# Patient Record
Sex: Female | Born: 1950
Health system: Southern US, Community
[De-identification: ages and names within clinical notes are randomized; demographics above are authoritative.]

## PROBLEM LIST (undated history)

## (undated) DIAGNOSIS — F329 Major depressive disorder, single episode, unspecified: Secondary | ICD-10-CM

## (undated) DIAGNOSIS — I10 Essential (primary) hypertension: Secondary | ICD-10-CM

## (undated) DIAGNOSIS — I471 Supraventricular tachycardia, unspecified: Secondary | ICD-10-CM

## (undated) DIAGNOSIS — F32A Depression, unspecified: Secondary | ICD-10-CM

## (undated) DIAGNOSIS — D649 Anemia, unspecified: Secondary | ICD-10-CM

## (undated) DIAGNOSIS — K633 Ulcer of intestine: Secondary | ICD-10-CM

## (undated) DIAGNOSIS — G4733 Obstructive sleep apnea (adult) (pediatric): Secondary | ICD-10-CM

## (undated) DIAGNOSIS — I4891 Unspecified atrial fibrillation: Secondary | ICD-10-CM

## (undated) HISTORY — DX: Anemia, unspecified: D64.9

## (undated) HISTORY — DX: Essential (primary) hypertension: I10

## (undated) HISTORY — DX: Obstructive sleep apnea (adult) (pediatric): G47.33

## (undated) HISTORY — PX: TOENAIL EXCISION: SUR558

## (undated) HISTORY — DX: Supraventricular tachycardia: I47.1

## (undated) HISTORY — DX: Ulcer of intestine: K63.3

## (undated) HISTORY — DX: Depression, unspecified: F32.A

## (undated) HISTORY — DX: Supraventricular tachycardia, unspecified: I47.10

## (undated) HISTORY — PX: WISDOM TOOTH EXTRACTION: SHX21

## (undated) HISTORY — DX: Major depressive disorder, single episode, unspecified: F32.9

---

## 2001-04-03 ENCOUNTER — Observation Stay (HOSPITAL_COMMUNITY): Admission: EM | Admit: 2001-04-03 | Discharge: 2001-04-05 | Payer: Self-pay | Admitting: Emergency Medicine

## 2001-04-03 ENCOUNTER — Encounter: Payer: Self-pay | Admitting: Emergency Medicine

## 2002-02-11 ENCOUNTER — Ambulatory Visit (HOSPITAL_COMMUNITY): Admission: RE | Admit: 2002-02-11 | Discharge: 2002-02-11 | Payer: Self-pay | Admitting: Internal Medicine

## 2003-11-18 ENCOUNTER — Emergency Department (HOSPITAL_COMMUNITY): Admission: EM | Admit: 2003-11-18 | Discharge: 2003-11-19 | Payer: Self-pay | Admitting: Emergency Medicine

## 2004-09-19 ENCOUNTER — Emergency Department (HOSPITAL_COMMUNITY): Admission: EM | Admit: 2004-09-19 | Discharge: 2004-09-19 | Payer: Self-pay | Admitting: *Deleted

## 2004-10-22 ENCOUNTER — Ambulatory Visit: Payer: Self-pay | Admitting: Psychiatry

## 2005-01-16 ENCOUNTER — Ambulatory Visit: Payer: Self-pay | Admitting: Psychiatry

## 2005-02-20 ENCOUNTER — Ambulatory Visit: Payer: Self-pay | Admitting: Psychiatry

## 2005-03-27 ENCOUNTER — Ambulatory Visit: Payer: Self-pay | Admitting: Psychiatry

## 2005-08-26 ENCOUNTER — Ambulatory Visit (HOSPITAL_COMMUNITY): Payer: Self-pay | Admitting: Psychiatry

## 2005-09-09 ENCOUNTER — Ambulatory Visit (HOSPITAL_COMMUNITY): Payer: Self-pay | Admitting: Psychiatry

## 2005-09-19 ENCOUNTER — Ambulatory Visit (HOSPITAL_COMMUNITY): Payer: Self-pay | Admitting: Psychiatry

## 2005-10-27 ENCOUNTER — Ambulatory Visit (HOSPITAL_COMMUNITY): Payer: Self-pay | Admitting: Psychiatry

## 2005-11-17 ENCOUNTER — Ambulatory Visit (HOSPITAL_COMMUNITY): Payer: Self-pay | Admitting: Psychiatry

## 2005-12-01 ENCOUNTER — Ambulatory Visit (HOSPITAL_COMMUNITY): Payer: Self-pay | Admitting: Psychiatry

## 2006-01-01 ENCOUNTER — Ambulatory Visit (HOSPITAL_COMMUNITY): Payer: Self-pay | Admitting: Psychiatry

## 2006-05-04 ENCOUNTER — Emergency Department (HOSPITAL_COMMUNITY): Admission: EM | Admit: 2006-05-04 | Discharge: 2006-05-04 | Payer: Self-pay | Admitting: Emergency Medicine

## 2007-05-20 HISTORY — PX: COLONOSCOPY: SHX174

## 2007-10-30 ENCOUNTER — Ambulatory Visit (HOSPITAL_COMMUNITY): Admission: RE | Admit: 2007-10-30 | Discharge: 2007-10-30 | Payer: Self-pay | Admitting: Gastroenterology

## 2007-11-11 ENCOUNTER — Ambulatory Visit: Payer: Self-pay | Admitting: Internal Medicine

## 2008-01-12 ENCOUNTER — Encounter: Admission: RE | Admit: 2008-01-12 | Discharge: 2008-01-12 | Payer: Self-pay | Admitting: Internal Medicine

## 2008-02-18 ENCOUNTER — Encounter: Payer: Self-pay | Admitting: Internal Medicine

## 2008-02-18 ENCOUNTER — Ambulatory Visit (HOSPITAL_COMMUNITY): Admission: RE | Admit: 2008-02-18 | Discharge: 2008-02-18 | Payer: Self-pay | Admitting: Internal Medicine

## 2008-02-18 ENCOUNTER — Ambulatory Visit: Payer: Self-pay | Admitting: Internal Medicine

## 2008-05-02 ENCOUNTER — Encounter: Payer: Self-pay | Admitting: Internal Medicine

## 2008-05-02 LAB — CONVERTED CEMR LAB: Creatinine, Ser: 1.01 mg/dL (ref 0.40–1.20)

## 2008-05-05 ENCOUNTER — Ambulatory Visit (HOSPITAL_COMMUNITY): Admission: RE | Admit: 2008-05-05 | Discharge: 2008-05-05 | Payer: Self-pay | Admitting: Internal Medicine

## 2008-07-18 ENCOUNTER — Ambulatory Visit: Payer: Self-pay | Admitting: Internal Medicine

## 2008-07-19 ENCOUNTER — Telehealth (INDEPENDENT_AMBULATORY_CARE_PROVIDER_SITE_OTHER): Payer: Self-pay | Admitting: *Deleted

## 2009-03-09 ENCOUNTER — Encounter: Payer: Self-pay | Admitting: Internal Medicine

## 2009-03-09 ENCOUNTER — Encounter (INDEPENDENT_AMBULATORY_CARE_PROVIDER_SITE_OTHER): Payer: Self-pay | Admitting: *Deleted

## 2009-05-21 ENCOUNTER — Encounter: Payer: Self-pay | Admitting: Internal Medicine

## 2009-05-21 DIAGNOSIS — K559 Vascular disorder of intestine, unspecified: Secondary | ICD-10-CM | POA: Insufficient documentation

## 2009-07-08 ENCOUNTER — Emergency Department (HOSPITAL_COMMUNITY): Admission: EM | Admit: 2009-07-08 | Discharge: 2009-07-09 | Payer: Self-pay | Admitting: Emergency Medicine

## 2009-08-30 ENCOUNTER — Encounter: Payer: Self-pay | Admitting: Internal Medicine

## 2010-06-18 NOTE — Miscellaneous (Signed)
Summary: Orders Update  Clinical Lists Changes  Problems: Added new problem of ISCHEMIC COLITIS (ICD-557.9) Orders: Added new Test order of T-Vitamin B12 [id number]) - Signed  Appended Document: Orders Update Lab order mailed to pt.

## 2010-06-18 NOTE — Letter (Signed)
Summary: [facility] NOTE  [facility] NOTE   Imported By: Sofie Rower 08/30/2009 12:08:03  _____________________________________________________________________  External Attachment:    Type:   Image     Comment:   External Document

## 2010-08-07 LAB — DIFFERENTIAL
Basophils Relative: 0 % (ref 0–1)
Lymphocytes Relative: 22 % (ref 12–46)
Lymphs Abs: 2.5 10*3/uL (ref 0.7–4.0)
Monocytes Absolute: 0.8 10*3/uL (ref 0.1–1.0)
Monocytes Relative: 7 % (ref 3–12)
Neutro Abs: 7.9 10*3/uL — ABNORMAL HIGH (ref 1.7–7.7)
Neutrophils Relative %: 69 % (ref 43–77)

## 2010-08-07 LAB — BASIC METABOLIC PANEL
CO2: 29 mEq/L (ref 19–32)
Calcium: 9.4 mg/dL (ref 8.4–10.5)
Creatinine, Ser: 0.93 mg/dL (ref 0.4–1.2)
GFR calc Af Amer: >60 mL/min (ref >60)
GFR calc non Af Amer: >60 mL/min (ref >60)
Sodium: 133 mEq/L — ABNORMAL LOW (ref 135–145)

## 2010-08-07 LAB — POCT CARDIAC MARKERS
CKMB, poc: <1 ng/mL — ABNORMAL LOW (ref 1.0–8.0)
Myoglobin, poc: 64.7 ng/mL (ref 12–200)
Troponin i, poc: <0.05 ng/mL (ref 0.00–0.09)

## 2010-08-07 LAB — CBC
Hemoglobin: 13.4 g/dL (ref 12.0–15.0)
RBC: 4.62 MIL/uL (ref 3.87–5.11)
WBC: 11.4 10*3/uL — ABNORMAL HIGH (ref 4.0–10.5)

## 2010-08-22 ENCOUNTER — Ambulatory Visit (INDEPENDENT_AMBULATORY_CARE_PROVIDER_SITE_OTHER): Payer: Managed Care, Other (non HMO) | Admitting: Otolaryngology

## 2010-08-22 DIAGNOSIS — H6990 Unspecified Eustachian tube disorder, unspecified ear: Secondary | ICD-10-CM

## 2010-08-22 DIAGNOSIS — J310 Chronic rhinitis: Secondary | ICD-10-CM

## 2010-08-22 DIAGNOSIS — H6980 Other specified disorders of Eustachian tube, unspecified ear: Secondary | ICD-10-CM

## 2010-10-01 NOTE — Op Note (Signed)
NAMELeena, Julia Martinez                ACCOUNT NO.:  [id number]   MEDICAL RECORD NO.:  [id number]          PATIENT TYPE:  AMB   LOCATION:  DAY                           FACILITY:  [facility]   PHYSICIAN:  R. Garfield Cornea, M.D. DATE OF BIRTH:  08/23/1950   DATE OF PROCEDURE:  02/18/2008  DATE OF DISCHARGE:                               OPERATIVE REPORT   INDICATIONS FOR PROCEDURE:  A 60 year old lady with a recent episode of  self-limiting abdominal cramps and diarrhea followed by blood per  rectum.  CT suggested ischemic or segmental colitis.  Symptoms pretty  well came and went within 48 hours.  She is currently devoid of any  lower GI tract symptoms.  In fact, she denies any abdominal pain or any  alteration of bowel habits.  Denies melena or hematochezia.  She does  not smoke.  No odynophagia, dysphagia, or early satiety, reflux  symptoms, nausea, vomiting.  Ileal colonoscopy now being done.  Her last  colonoscopy was in September 2003 by Dr. Collene Mares.  She was found have 2  diverticula on her left colon, normal ileocecal valve, and terminal  ileum.  Risks, benefits, alternatives, and limitations of this approach  have been reviewed and questions answered.   PROCEDURE NOTE:  O2 saturation, blood pressure, and pulse of the patient  monitored throughout the entire procedure. Conscious sedation, Versed 3  mg IV and Demerol 75 mg IV in divided doses.   INSTRUMENT:  Pentax video chip system.   FINDINGS:  Digital rectal exam reveals single external hemorrhoidal tag,  otherwise unremarkable.   Endoscopic findings: The prep was good.   Colon:  Colonic mucosa was surveyed from the rectosigmoid junction  through the left, transverse, right colon to the appendiceal orifice,  ileocecal valve, and cecum.  These structures were well seen and  photographed for the record. The terminal ileum was intubated 30 cm.  From this level, the scope was slowly withdrawn.  Previously mentioned  mucosal surfaces  were again seen.  The following abnormalities were  noted.  The ileocecal valve was markedly abnormal with a gaping fish mouth  appearance.  There appeared to be some retraction in the mucosa around  the valve.  The terminal ileum was inflamed.  There were multiple  diverticula and pseudodiverticula present.  The distal 10 cm of the  terminal ileum appeared somewhat cavernous.  There was a 1-cm ulcer with  surrounding erosions, very distal terminal ileum at the junction of the  ileum and colon.  These inflammatory changes extended up to 30 cm.  These appeared to be benign inflammatory changes.  Multiple biopsies  taken.   The patient also had numerous left-sided diverticula; however, there is  no evidence of neoplasm, colitis, or colon cancer.  The scope was pulled  down to the rectum with the examination of the rectal mucosa including  retroflexed view of the anal verge demonstrated no abnormalities.  The  patient tolerated the procedure well and reacted in endoscopy.   IMPRESSION:  1. Single external hemorrhoid tag, otherwise normal rectum.  2. Numerous left-sided diverticula, abnormal ileocecal  valve, and      terminal ileum highly suggestive of Crohn disease status post      biopsy.  Remainder of colonic mucosa appeared unremarkable.   The patient's recent self-limiting illness characterized by abdominal  cramps and bloody diarrhea consistent with CT findings most consistent  with ischemic or segmental colitis, which is almost always a self-  limiting non-recurrent disorder.   She has marked inflamed chronic-appearing inflammatory changes of her  ileocecal valve and terminal ileum, not seen on a iliac colonoscopy 6  years ago by Dr. Collene Mares.  She is not taking any nonsteroidal agents.   These findings are most likely consistent with a Crohn ileitis.   She is essentially devoid of any GI symptoms at this time.   RECOMMENDATIONS:  1. Diverticulosis literature provided to Ms.  Sloma.  2. Follow up on path.  3. Further recommendations to follow.      Bridgette Habermann, M.D.  Electronically Signed     RMR/MEDQ  D:  02/18/2008  T:  02/18/2008  Job:  [id number]   cc:   Meda Klinefelter  Fax: [phone]

## 2010-10-01 NOTE — Assessment & Plan Note (Signed)
NAMECAYDANCE, Julia Martinez                 CHART#:  [id number]   DATE:  07/18/2008                       DOB:  [birth year]   A 60 year old lady who I saw last year for a self-limiting illness,  abdominal cramps and bloody diarrhea.  Abdominal CT suggested segmental  or ischemic colitis.  On February 18, 2008 she underwent a colonoscopy by  me sometime after her acute symptoms resolved.  She was found to have an  external hemorrhoidal tag, numerous left-sided diverticula and a grossly  abnormal ileocecal valve with a gaping fish-mouth appearance opening.  There were multiple diverticula and pseudodiverticula present in the  distal terminal ileum.  There were erosions and ileal ulcerations.  Multiple biopsies were taken.  Biopsies revealed nonspecific  ulcerations, there were no granulomas.  She subsequently underwent a CT  enterography which revealed chronic inflammatory change of the distal 18  cm or so of terminal ileum, almost certainly indicative of small bowel  Crohn disease.   I looked back at her last colonoscopy photos in the old olypmus system  that Dr. Laural Martinez performed back in 2003.  Her Ileocecal valve appeared  entirely normal based on the photographs, terminal ileum was not  intubated at that time.  Ms. Julia Martinez is back in the office today for  followup.  She has absolutely no GI symptoms.  No abdominal pain  whatsoever.  She has 1-2 formed bowel movements daily, has not seen any  melena or hematochezia.  She has actually been gaining weight and is  trying to exercise and obtain a more advantageousl BMI.  She has not had  any early satiety, nausea or vomiting, has not had any fever.  No eye,  joint or skin problems.  I did briefly speak to Dr. Koleen Martinez about Ms.  Julia Martinez on May 15, 2008, and he was not really sure what he would  do in this setting as far as committing Ms. Julia Martinez to long-term  chronic therapy for Crohn disease, given that as best I can tell she is  totally  asymptomatic.  It is also worth noting she is not taking any  nonsteroidal agents whatsoever.   CURRENT MEDICATIONS:  Cardizem 240 mg daily, metoprolol daily.   ALLERGIES:  PENICILLIN.   EXAMINATION TODAY:  A pleasant 59-year lady resting comfortably, weight  168.5, height 5 feet 4, temp 98.5, BP 168/84, pulse 88.  SKIN:  Warm and dry.  There is no jaundice or cutaneous stigmata of  chronic liver disease.  CHEST:  Lungs are clear to auscultation.  CARDIAC EXAM:  Regular rate and rhythm without murmur, gallop or rub.  ABDOMEN:  Nondistended, positive bowel sounds, entirely soft and  nontender without appreciable mass or organomegaly.   ASSESSMENT:  Markedly abnormal ileocecal valve and terminal ileum,  endoscopically, histologically and radiographically all suggestive of  small bowel Crohn disease in a very nice lady who has no symptoms  consistent with Crohn disease or any other gastrointestinal disorder for  that matter at this time.   As a totally separate issue, she did have a bout of left-sided ischemic  or segmental colitis last year which was a self-limiting process.   RECOMMENDATIONS:  I told Ms. Julia Martinez I really had no prognostic  information as to whether or not this process will progress and  increasing risk of  complications including a fistula, strictures, need  for operations, etc. or not.  We talked about committing to long-term  immunosuppressive therapy.  That is a difficult sell for me, given the  paucity of any symptoms.  However, I would certainly like to minize any  future complications.  I told Ms. Julia Martinez that we were in no rush to do  anything at this point in time, although I felt at this time the most  practical approach would be for her next encounter with a  gastroenterologist to be with Dr. Rickard Martinez over at [facility], perhaps a fall consultation to get his  opinion as to which way to go in terms of future management.  I  suppose  we could do some imaging of her small bowel, i.e. CT at yearly intervals  to assess stability/progression of this process.  Ms. Julia Martinez is  agreeable with this approach.  I will take the liberty of setting her up  this coming fall to see Dr. Koleen Martinez for a non-urgent consultation.  At some point later in the year, she ought to have a vitamin B12 level  drawn and get a current bone density study as a baseline if this has not  yet been done.   I will discuss further with Dr. Cleta Martinez in the near future.       Bridgette Julia Martinez, M.D.  Electronically Signed     RMR/MEDQ  D:  07/18/2008  T:  07/18/2008  Job:  [id number]   cc:   Julia Flesher. Halm, DO, FAAP

## 2010-10-01 NOTE — H&P (Signed)
NAMEDawnyel, Leven Julia Martinez                ACCOUNT NO.:  [id number]   MEDICAL RECORD NO.:  [id number]         PATIENT TYPE:  PAMB   LOCATION:  DAY                           FACILITY:  [facility]   PHYSICIAN:  R. Garfield Cornea, M.D. DATE OF BIRTH:  [birth year]   DATE OF ADMISSION:  DATE OF DISCHARGE:  LH                              HISTORY & PHYSICAL   REFERRING PHYSICIAN:  Micah Flesher. Halm, DO, FAAP   REASON FOR CONSULTATION:  Worse hematochezia, abnormal CT (see below).   HISTORY OF PRESENT ILLNESS:  Ms. Julia Martinez is a 60 year old lady,  mother of one of our endoscopy nurse at [facility], who  suffered an acute illness back early on October 30, 2007, where she  developed acute-onset abdominal cramps and diarrhea followed by several  episodes of gross blood per rectum.  The constellation of symptoms  pretty much came and went within 12 hours.  Her daughter, Julia Arnett, RN, brought this to the attention of my partner, Dr. Caro Hight in Endo.  One morning, he ordered a CT scan and some blood work.  CT scan (which was personally reviewed by Dr. Gilman Schmidt) revealed two liver  lesions, one in the right lobe consistent with hemangioma and a  nonspecific lesion in the caudate lobe of the liver and also had  inflammatory changes of the circle segmental sigmoid colon consistent  with diverticulitis or ischemic colitis.  It is notable in 2003, she  only had two checks as seen by Dr. Laural Golden in colonoscopy.  Please note,  she has not had any GI symptoms prior to this episode or since this  episode, which lasted less than 24 hours.   LABORATORY DATA:  Done on November 13 revealed a white count of 10.8,  H&H 14 and 40.5.  Chem-20 looked good except for slightly elevated  glucose of 116.  Her LFTs were normal.   Dr. Cleta Alberts recently saw this nice lady and did another hemoglobin on her  and it was found to be still normal at 12.8.  Ms. Julia Martinez does not  smoke.  She has no history of peripheral  vascular disease.  She does  have a history of hypertension and tachycardia.   Of note, she was given two prescriptions by Dr. Stann Mainland for Cipro and  Flagyl.  She took them for 3 days, became nauseated, and did not finish  the prescription.   PAST MEDICAL HISTORY:  Significant for hypertension and tachycardia.   PAST SURGICAL HISTORY:  C-section and LASIK eye surgery.   CURRENT MEDICATIONS:  1. Cardizem 240 mg daily.  2. Metoprolol 1 tablet daily.   ALLERGIES:  No known drug allergies.   FAMILY HISTORY:  Father died with congestive heart failure.  Mother died  with aneurysm.  No history of chronic GI or liver illness.   SOCIAL HISTORY:  The patient married for 34 years, has 3 children, 2  daughters and 1 son with Prader-Willi syndrome.  Employed part time at  the [employer] office.  No tobacco, no alcohol, no illicit  drugs.   REVIEW OF SYSTEMS:  Some chest pain.  No dyspnea on exertion.  No fever  or chills.  No change in weight.  No odynophagia, dysphagia, orthostatic  reflux symptoms, nausea, or vomiting.   PHYSICAL EXAMINATION:  GENERAL:  She is accompanied by her youngest  daughter.  VITAL SIGNS:  Weight 140 pounds, height 5 feet 14 inches, temp 97, BP  170/100, and pulse 92.  SKIN:  Warm and dry.  HEENT:  No scleral icterus.  Conjunctivae are pink.  CHEST:  Lungs clear to auscultation.  CARDIO:  Regular rate and rhythm without murmur, gallop, or rub.  ABDOMEN:  Nondistended.  Positive bowel sounds.  Entirely soft and  nontender without appreciable mass or organomegaly.  EXTREMITIES:  No edema.  RECTAL:  Deferred to time of colonoscopy.   IMPRESSION:  Ms. Julia Martinez is a very pleasant 60 year old lady who  experienced a self-limiting illness characterized by abdominal cramps  and diarrhea followed by gross blood per rectum.  These symptoms are  essentially textbook for ischemic or segmental colon, which is almost  always a self-limiting nonrecurrent disease  process.   She does not smoke and there is no history of peripheral vascular  disease.   Her symptoms came and left much too quick to be diverticulitis or a food  borne illness.  CAT scan findings are also entirely consistent with  ischemic or segmental colitis.   She did have a hemangioma in the right lobe of her liver and a  nonspecific lesion in the caudate lobe.  We agree with Dr. Gilman Schmidt this  needs to be followed upon, although hopefully it is likely going to turn  out to be nothing significant.   RECOMMENDATIONS:  Ms. Julia Martinez opted to go ahead and have a colonoscopy  to examine her entire colon with particular tension to the abnormal  segment seen on CT, although likely any signs of inflammation will be  not present by the time she comes for colonoscopy.  Discussed risks,  benefits, and alternatives with this nice lady.  Questions answered.  She is agreeable.  She wants to have this procedure done in August and  that is certainly fine with me.  Just before colonoscopy, I have  recommended she go and have an MRI for liver to further evaluate the  lesion seen  on her recent CT.  __________also discussed with Ms. Julia Martinez.  We will  make further recommendations in the very near future.   I would like to thank Dr. Loralyn Freshwater for allowing me to see this nice  lady today.      Julia Martinez, M.D.  Electronically Signed     Julia Martinez/MEDQ  D:  11/11/2007  T:  11/12/2007  Job:  [id number]   cc:   Micah Flesher. Cleta Alberts DO, [place]  Fax: [phone]

## 2010-10-04 NOTE — Procedures (Signed)
[facility]  Patient:    Julia Martinez, Julia Martinez Visit Number: [id number] MRN: [id number]          Service Type: OBV Location: 2A A220 01 Attending Physician:  Shaune Pollack Dictated by:   Sinda Du, M.D. Proc. Date: 04/03/01 Admit Date:  04/03/2001                            EKG Interpretations  TIME:  1423  INTERPRETATION:  The rhythm is an atrial fibrillation with a rapid ventricular response of about 150.  Q-T interval is prolonged, which may be due to an electrolyte imbalance, drug effect or primary myocardial disease.  There are diffuse ST-T wave abnormalities which are consistent with ischemia and clinical correlation is suggested.  IMPRESSION:  Abnormal electrocardiogram. Dictated by:   Sinda Du, M.D. Attending Physician:  Shaune Pollack DD:  04/04/01 TD:  04/05/01 Job: [id number]

## 2010-10-04 NOTE — H&P (Signed)
[facility]  Patient:    ALEXEI, EY Visit Number: [id number] MRN: [id number]          Service Type: OBV Location: 2A A220 01 Attending Physician:  Shaune Pollack Dictated by:   Loralyn Freshwater, D.O. Admit Date:  04/03/2001                           History and Physical  CHIEF COMPLAINT:  Palpitations and dizziness.  BRIEF HISTORY:  Patient is a 60 year old female known to my practice who presents to the emergency room with an acute onset of palpitations, generalized weakness and dizziness.  She was apparently in her car when she had this paroxysm of dizziness.  She took her pulse in her neck and noted that it was quite irregular.  She contacted her daughter, who assisted her to the emergency room for further evaluation.  Ms. Coonrod, of note, has had episodes of paroxysmal atrial tachycardia in the past and I believe she was hospitalized approximately seven or eight years ago for a couple of days due to this.  She has had no chest pain, no dyspnea and no swelling.  PAST MEDICAL HISTORY:  PAT with a relatively good detailed cardiac workup. She has a history of panic attacks and depression and has received disability for this in the past.  PAST SURGICAL HISTORY:  Cesarean section.  MEDICATIONS: 1. Lanoxin 0.125 mg daily. 2. Luvox 50 mg p.o. q.h.s.  ALLERGIES:  PENICILLIN.  FAMILY HISTORY:  Negative for premature coronary disease.  The patients husband interestingly has atrial fibrillation which is treated with Coumadin. The patients son has an unusual genetic condition called Prader-Willi syndrome.  REVIEW OF SYSTEMS:  Patient admits to dizziness but no loss of consciousness. She has not been taking excessive amounts of caffeinated products and admits to only two cokes on a daily basis.  She denies any significant headache.  She has had some dizziness, as noted.  She has had no visual complaints. Palpitations are infrequent with her and in  the past have responded to products such as Lanoxin and verapamil.  She has had no skin rash, no fevers, no cough, no URI symptoms, no abdominal pains, vomiting or diarrhea.  She has overall been stable in regards to her depression but she states that she has had increased generalized stress.  Of interest is that her husband leaves town this time of the year each year for a hunting trip in [place] and it was approximately this time a year ago that she had some palpitations which were managed at home.  PHYSICAL EXAMINATION:  VITAL SIGNS:  Initial heart rate was 140; it is currently between 86 and 130; her temperature is 97 degrees; respirations 20; blood pressure 148/98 initially and down to 82/50 after Lopressor.   Her O2 saturation is 99%.  GENERAL:  The patient is anxious but comfortable.  She has a mild resting tremor.  She is alert and oriented.  She has no JVD.  She has good carotid upstrokes on both sides with no bruits.  She has no thyromegaly.  HEART:  Tachycardia and irregular.  Her PMI is normal.  LUNGS:  Clear in both fields.  ABDOMEN:  Soft and nontender.  EXTREMITIES:  Good pulses.  They are both warm with no edema.  Deep tendon reflexes are unremarkable.  NEUROLOGIC:  Nonfocal.  GYN:  Deferred at this time.  BREASTS:  Deferred at this time.  LABORATORY STUDIES:  Potassium is 3.5, sodium 140, creatinine 0.9, BUN of 9. Calcium is normal at 9.2.  Liver function tests are normal.  PT is 12.8, PTT is 23.  CK total is 71 with a 1.2 MB fraction.  Troponin is 0.02.  Her Lanoxin level is subtherapeutic at 0.3.  EKG:  She had numerous EKGs in the emergency room and they both are consistent with a supraventricular tachycardia, which it does appear to be an atrial fibrillation.  Her QRS axes are normal.  She has some ST depression, probably due to her rapid rate, which is as high as 188.  I believe this is atrial fibrillation, given the irregularity of the rhythm overall.   Abnormal ECG.  IMPRESSION AND PLAN:  Supraventricular tachycardia/atrial fibrillation.  We will place the patient on higher dosing of Lanoxin and give her an extra 0.25 mg intravenously in the emergency room.  She has also currently received some Lopressor 5 mg as well as Cardizem intravenously.  I will plan to continue her Lopressor at 50 mg p.o. b.i.d.  We will place her on an aspirin a day and hold off on anticoagulation at this time because her symptoms have just started and she is acutely in atrial fibrillation and has no risk of forming clots and having cerebrovascular accident sequelae associated with chronic recurrent atrial fibrillation.  She also has a history of significant anxiety for which we will continue her on Luvox and Ativan, which she takes on occasion.  We will also obtain an echocardiogram while she is in the hospital to assess her atrial size to ensure that she will chemically convert.  We will also check a T4 and thyroid-stimulating hormone while she is here in the hospital. We will put her on a caffeine-free diet and follow her symptomatology.  The overall care plan has been reviewed with Ms. Schneck and she is in agreement.Dictated by:   Loralyn Freshwater, D.O. Attending Physician:  Shaune Pollack DD:  04/03/01 TD:  04/04/01 Job: [id number]

## 2010-10-04 NOTE — Procedures (Signed)
NAMEFELICITE, ZEIMET                ACCOUNT NO.:  [id number]   MEDICAL RECORD NO.:  [id number]          PATIENT TYPE:  EMS   LOCATION:  ED                            FACILITY:  [facility]   PHYSICIAN:  Edward L. Luan Pulling, M.D.DATE OF BIRTH:  09-May-1951   DATE OF PROCEDURE:  05/04/2006  DATE OF DISCHARGE:  05/04/2006                              EKG INTERPRETATION   EKG:  1846   The rhythm is sinus tachycardia with a rate of about 110-112.  The axis  is rightward.  There appears to be an incomplete right bundle branch  block.  ST-T wave abnormality is seen which could indicate ischemia and  clinical correlation is suggested.  Abnormal electrocardiogram.      Jasper Loser. Luan Pulling, M.D.  Electronically Signed     ELH/MEDQ  D:  05/05/2006  T:  05/06/2006  Job:  [id number]

## 2010-10-04 NOTE — Procedures (Signed)
[facility]  Patient:    Julia Martinez, Julia Martinez Visit Number: [id number] MRN: [id number]          Service Type: OBV Location: 2A A220 01 Attending Physician:  Janit Bern Dictated by:   Loralyn Freshwater, D.O. Proc. Date: 04/04/01 Admit Date:  04/03/2001 Discharge Date: 04/05/2001                            EKG Interpretations  ECG shows normal sinus rhythm.  The patient does have inverted T-waves in V1-V4 as well as in lead 3.  Review of her previous EKG shows that this is her normal pattern.  There is no evidence of dysrhythmia.  IMPRESSION:  Abnormal ECG, but unchanged to prior normal sinus rhythm reading. Dictated by:   Loralyn Freshwater, D.O. Attending Physician:  Janit Bern DD:  05/28/01 TD:  05/29/01 Job: [id number]
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
## 2011-02-13 LAB — CBC
Hemoglobin: 14
MCHC: 34.7
RBC: 4.68
RDW: 13.9

## 2011-02-13 LAB — COMPREHENSIVE METABOLIC PANEL
ALT: 23
AST: 20
Alkaline Phosphatase: 90
CO2: 26
Calcium: 9.6
GFR calc Af Amer: >60
Glucose, Bld: 116 — ABNORMAL HIGH
Potassium: 4.1
Sodium: 139
Total Protein: 7.3

## 2013-10-06 ENCOUNTER — Ambulatory Visit (INDEPENDENT_AMBULATORY_CARE_PROVIDER_SITE_OTHER): Payer: BC Managed Care – PPO

## 2013-10-06 ENCOUNTER — Ambulatory Visit (INDEPENDENT_AMBULATORY_CARE_PROVIDER_SITE_OTHER): Payer: BC Managed Care – PPO | Admitting: Orthopedic Surgery

## 2013-10-06 VITALS — BP 186/93

## 2013-10-06 DIAGNOSIS — M79642 Pain in left hand: Secondary | ICD-10-CM

## 2013-10-06 DIAGNOSIS — M79609 Pain in unspecified limb: Secondary | ICD-10-CM
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
## 2013-10-06 NOTE — Patient Instructions (Signed)
Call to arrange therapy

## 2013-10-24 ENCOUNTER — Ambulatory Visit (HOSPITAL_COMMUNITY)
Admission: RE | Admit: 2013-10-24 | Discharge: 2013-10-24 | Disposition: A | Discharge status: Home / Self Care | Payer: BC Managed Care – PPO | Source: Ambulatory Visit | Attending: Orthopedic Surgery | Admitting: Orthopedic Surgery

## 2013-10-24 DIAGNOSIS — M25649 Stiffness of unspecified hand, not elsewhere classified: Secondary | ICD-10-CM | POA: Insufficient documentation

## 2013-10-24 DIAGNOSIS — R279 Unspecified lack of coordination: Secondary | ICD-10-CM | POA: Insufficient documentation

## 2013-10-24 DIAGNOSIS — IMO0001 Reserved for inherently not codable concepts without codable children: Secondary | ICD-10-CM | POA: Insufficient documentation

## 2013-10-24 DIAGNOSIS — M6281 Muscle weakness (generalized): Secondary | ICD-10-CM | POA: Insufficient documentation
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
## 2013-10-25 ENCOUNTER — Ambulatory Visit (HOSPITAL_COMMUNITY)
Admission: RE | Admit: 2013-10-25 | Discharge: 2013-10-25 | Disposition: A | Discharge status: Home / Self Care | Payer: BC Managed Care – PPO | Source: Ambulatory Visit | Attending: Internal Medicine | Admitting: Internal Medicine

## 2013-10-25 DIAGNOSIS — R279 Unspecified lack of coordination: Secondary | ICD-10-CM

## 2013-10-25 NOTE — Progress Notes (Signed)
Occupational Therapy Treatment Patient Details  Name: Julia Martinez MRN: [id number] Date of Birth: May 28, 1950  Today's Date: 10/25/2013 Time: 3846-6599 OT Time Calculation (min): 42 min MFR 3570-1779 12' Theract 1540-1610 30'  Visit#: 2 of 8  Re-eval: 11/21/13    Authorization:    Authorization Time Period:    Authorization Visit#:   of    Subjective Symptoms/Limitations Symptoms: S: I feel like they have gotten better since it happened.  Pain Assessment Currently in Pain?: No/denies  Precautions/Restrictions  Precautions Precautions: None  Exercise/Treatments Hand Exercises MCPJ Flexion: PROM;10 reps MCPJ Extension: PROM;10 reps PIPJ Flexion: PROM;10 reps PIPJ Extension: PROM;10 reps DIPJ Flexion: PROM;10 reps DIPJ Extension: PROM;10 reps Hand Gripper with Medium Beads: 15 beads - black handgripper  Sponges: 17,18 Small Pegboard: Grooved pegboard completed with ring, small finger and thumb; increased difficulty; all pins in/out Other Hand Exercises: resistive clothespins; using thumb, ring, and small finger; all colors on/off; increased difficulty with blue and black  Other Hand Exercises: Finger tip manipulation task with small green ball; left/right 5 times; max difficulty     Manual Therapy Manual Therapy: Myofascial release Myofascial Release: MFR and manual stretching to right flexor and extensor forearm, wrist and hand to decrease pain and fascial restrictions and improve pain free mobility in ring and small digites   Occupational Therapy Assessment and Plan OT Assessment and Plan Clinical Impression Statement: A: patient was introduced to pinch strengthening and fine motor coordination tasks this date. Patient had increased difficulty with all activities. patient tended to rush through activtiies which in turn caused difficulty with technique. Patient required several vc's during session to slow down movement and work on technique,.  Clinical Impairments  Affecting Rehab Potential: motiviation OT Plan: P: Nuts/bolts board using ring and small finger, theraputty   Goals Short Term Goals Time to Complete Short Term Goals: 2 weeks Short Term Goal 1: Patient will be educated on HEP. Short Term Goal 1 Progress: Progressing toward goal Short Term Goal 2: Patient will improve left ring finger PIPJ flexion by 5 degrees for increased ability to keyboard and play piano. Short Term Goal 2 Progress: Progressing toward goal Short Term Goal 3: Patient will decrease completion time on Nine Hole Peg Test by 4 seconds in left hand for increased [place] needed to play piano. Short Term Goal 3 Progress: Progressing toward goal Long Term Goals Time to Complete Long Term Goals: 4 weeks Long Term Goal 1: Patient will return to prior level of independence with all B/IADLS, work, and leisure activities.  Long Term Goal 1 Progress: Progressing toward goal Long Term Goal 2: Patient will improve left ring finger PIPJ flexion by 10 degrees for increased ability to keyboard and play piano. Long Term Goal 2 Progress: Progressing toward goal Long Term Goal 3: Patient will decrease completion time on Nine Hole Peg Test by 8 seconds in left hand for increased [place] needed to play piano. Long Term Goal 3 Progress: Progressing toward goal Long Term Goal 4: Patient will improve left grip strength by 5 pounds and pinch strength by 1 pound for increased ability to open containers. Long Term Goal 4 Progress: Progressing toward goal Long Term Goal 5: Patient will have trace fascial restrictions in her left hand and forearm.  Long Term Goal 5 Progress: Progressing toward goal  Problem List Patient Active Problem List   Diagnosis Date Noted  . Lack of coordination 10/24/2013  . ISCHEMIC COLITIS 05/21/2009    End of Session Activity Tolerance: Patient tolerated  treatment well General Behavior During Therapy: [facility] for tasks assessed/performed   Ailene Ravel, OTR/L,CBIS    10/25/2013, 4:19 PM

## 2013-10-31 ENCOUNTER — Ambulatory Visit (HOSPITAL_COMMUNITY): Payer: BC Managed Care – PPO

## 2013-10-31 ENCOUNTER — Telehealth (HOSPITAL_COMMUNITY): Payer: Self-pay

## 2013-11-02 ENCOUNTER — Ambulatory Visit (HOSPITAL_COMMUNITY)
Admission: RE | Admit: 2013-11-02 | Discharge: 2013-11-02 | Disposition: A | Discharge status: Home / Self Care | Payer: BC Managed Care – PPO | Source: Ambulatory Visit | Attending: Internal Medicine | Admitting: Internal Medicine

## 2013-11-02 DIAGNOSIS — R279 Unspecified lack of coordination: Secondary | ICD-10-CM

## 2013-11-02 NOTE — Progress Notes (Signed)
Occupational Therapy Treatment Patient Details  Name: Julia Martinez MRN: [id number] Date of Birth: [birth year]  Today's Date: 11/02/2013 Time: 1352-1430 OT Time Calculation (min): 38 min MFR 1352-1400 8' Theract 1400-1430 30'  Visit#: 3 of 8  Re-eval: 11/21/13     Subjective Symptoms/Limitations Symptoms: S: I feel like I'm gettting much better. The ring finger is the only one now that I'm noticing anything. Pain Assessment Currently in Pain?: No/denies  Precautions/Restrictions  Precautions Precautions: None  Exercise/Treatments Hand Exercises MCPJ Flexion: PROM;10 reps MCPJ Extension: PROM;10 reps PIPJ Flexion: PROM;10 reps PIPJ Extension: PROM;10 reps DIPJ Flexion: PROM;10 reps DIPJ Extension: PROM;10 reps Theraputty - Grip: red Theraputty - Pinch: red Theraputty - Locate Pegs: red- 10 beads Hand Gripper with Medium Beads: 15 beads - black handgripper  Sponges: 7,8,8 - high resistance Other Hand Exercises: nuts/bolts board; unscrewed with left hand ring, small and thumb only     Manual Therapy Manual Therapy: Myofascial release Myofascial Release: MFR and manual stretching to right flexor and extensor forearm, wrist and hand to decrease pain and fascial restrictions and improve pain free mobility in ring and small digits   Occupational Therapy Assessment and Plan OT Assessment and Plan Clinical Impression Statement: A: Patient with no fascial restrictions or pain this date. Continued with fine motor coordination and pinch strengthening exercises. Patient did not rush through activities as she had done previously, Clinical Impairments Affecting Rehab Potential: motiviation OT Plan: P: Perform MFR and manual stretching PRN. complete colored pegboard pattern while placing and removing pegs with twizzers.   Goals Short Term Goals Time to Complete Short Term Goals: 2 weeks Short Term Goal 1: Patient will be educated on HEP. Short Term Goal 1 Progress: Progressing  toward goal Short Term Goal 2: Patient will improve left ring finger PIPJ flexion by 5 degrees for increased ability to keyboard and play piano. Short Term Goal 2 Progress: Progressing toward goal Short Term Goal 3: Patient will decrease completion time on Nine Hole Peg Test by 4 seconds in left hand for increased [place] needed to play piano. Short Term Goal 3 Progress: Progressing toward goal Long Term Goals Time to Complete Long Term Goals: 4 weeks Long Term Goal 1: Patient will return to prior level of independence with all B/IADLS, work, and leisure activities.  Long Term Goal 1 Progress: Progressing toward goal Long Term Goal 2: Patient will improve left ring finger PIPJ flexion by 10 degrees for increased ability to keyboard and play piano. Long Term Goal 2 Progress: Progressing toward goal Long Term Goal 3: Patient will decrease completion time on Nine Hole Peg Test by 8 seconds in left hand for increased [place] needed to play piano. Long Term Goal 3 Progress: Progressing toward goal Long Term Goal 4: Patient will improve left grip strength by 5 pounds and pinch strength by 1 pound for increased ability to open containers. Long Term Goal 4 Progress: Progressing toward goal Long Term Goal 5: Patient will have trace fascial restrictions in her left hand and forearm.  Long Term Goal 5 Progress: Progressing toward goal  Problem List Patient Active Problem List   Diagnosis Date Noted  . Lack of coordination 10/24/2013  . ISCHEMIC COLITIS 05/21/2009    End of Session Activity Tolerance: Patient tolerated treatment well General Behavior During Therapy: [facility] for tasks assessed/performed   Ailene Ravel, OTR/L,CBIS   11/02/2013, 2:35 PM

## 2013-11-07 ENCOUNTER — Ambulatory Visit (HOSPITAL_COMMUNITY)
Admission: RE | Admit: 2013-11-07 | Discharge: 2013-11-07 | Disposition: A | Discharge status: Home / Self Care | Payer: BC Managed Care – PPO | Source: Ambulatory Visit | Attending: Internal Medicine | Admitting: Internal Medicine

## 2013-11-07 NOTE — Progress Notes (Signed)
Occupational Therapy Treatment Patient Details  Name: Julia Martinez MRN: [id number] Date of Birth: 1950-11-15  Today's Date: 11/07/2013 Time: 2423-5361 OT Time Calculation (min): 41 min Manual 4431-5400 (10') Therapeutic Activities 8676-1950 (54')  Visit#: 4 of 8  Re-eval: 11/21/13    Authorization:    Authorization Time Period:    Authorization Visit#:   of    Subjective Symptoms/Limitations Symptoms: "I play the paino, and I waspracticing scaels, and they just odn't go as fast.  but my typings better." Pain Assessment Currently in Pain?: No/denies  Exercise/Treatments Hand Exercises MCPJ Flexion: PROM;5 reps MCPJ Extension: PROM;5 reps PIPJ Flexion: PROM;5 reps PIPJ Extension: PROM;5 reps DIPJ Flexion: PROM;5 reps DIPJ Extension: PROM;5 reps Theraputty - Flatten: red - uisng 4th and 5th sigits to spread putty Theraputty - Locate Pegs: red - placing 10 beads in putty, and then finding using primarily 4th and 5th digits Purdue Pegboard: using tweezers to place pegs into peg board for improved fine motor coordination of 1st, 4th, and 5th digits - 35 pegs.  using hand gripper to pull pegs back out and place into box - (gold at #2 hole 22 lbs) Other Hand Exercises: knut/bolt board - unscrewing with left 4th and 1st digits (6) - rescrewing with same digits     Manual Therapy Manual Therapy: Myofascial release Myofascial Release: MFR and manual stretching to right flexor and extensor forearm, wrist and hand to decrease pain and fascial restrictions and improve pain free mobility in ring and small digits.    Occupational Therapy Assessment and Plan OT Assessment and Plan Clinical Impression Statement:  Minimal myofascial release and PROM completed this session - no restrictions noted and pt has no pain. Pt had good tolerance using tweezrs and then hand gripper for pegsboard.  Added removal of knuts from screwboard - pt had increased difficulty, but stilla ble to complete.   Completed removal of bead from theraputty - pt verbalized increased success fom previous session. OT Plan: P: Perform MFR and manual stretching PRN. Play [place]cala with small beads, pickin gup and in-hand manipulation with 4th digit.   Goals Short Term Goals Short Term Goal 1: Patient will be educated on HEP. Short Term Goal 1 Progress: Progressing toward goal Short Term Goal 2: Patient will improve left ring finger PIPJ flexion by 5 degrees for increased ability to keyboard and play piano. Short Term Goal 2 Progress: Progressing toward goal Short Term Goal 3: Patient will decrease completion time on Nine Hole Peg Test by 4 seconds in left hand for increased [place] needed to play piano. Short Term Goal 3 Progress: Progressing toward goal Long Term Goals Long Term Goal 1: Patient will return to prior level of independence with all B/IADLS, work, and leisure activities.  Long Term Goal 1 Progress: Progressing toward goal Long Term Goal 2: Patient will improve left ring finger PIPJ flexion by 10 degrees for increased ability to keyboard and play piano. Long Term Goal 2 Progress: Progressing toward goal Long Term Goal 3: Patient will decrease completion time on Nine Hole Peg Test by 8 seconds in left hand for increased [place] needed to play piano. Long Term Goal 3 Progress: Progressing toward goal Long Term Goal 4: Patient will improve left grip strength by 5 pounds and pinch strength by 1 pound for increased ability to open containers. Long Term Goal 4 Progress: Progressing toward goal Long Term Goal 5: Patient will have trace fascial restrictions in her left hand and forearm.  Long Term Goal 5 Progress:  Progressing toward goal  Problem List Patient Active Problem List   Diagnosis Date Noted  . Lack of coordination 10/24/2013  . ISCHEMIC COLITIS 05/21/2009    End of Session Activity Tolerance: Patient tolerated treatment well General Behavior During Therapy: [facility] for tasks  assessed/performed  GO    [name], MS, OTR/L [phone]  11/07/2013, 4:19 PM

## 2013-11-09 ENCOUNTER — Ambulatory Visit (HOSPITAL_COMMUNITY)
Admission: RE | Admit: 2013-11-09 | Discharge: 2013-11-09 | Disposition: A | Discharge status: Home / Self Care | Payer: BC Managed Care – PPO | Source: Ambulatory Visit | Attending: Internal Medicine | Admitting: Internal Medicine

## 2013-11-09 NOTE — Progress Notes (Signed)
Occupational Therapy Treatment Patient Details  Name: Julia Martinez MRN: [id number] Date of Birth: Sep 20, 1950  Today's Date: 11/09/2013 Time: 5093-2671 OT Time Calculation (min): 41 min Therapeutic Activity 41'  Visit#: 5 of 8  Re-eval: 11/21/13    Authorization:    Authorization Time Period:    Authorization Visit#:   of    Subjective Symptoms/Limitations Symptoms: "I've just been so sore since last time." Pain Assessment Currently in Pain?: Yes Pain Score: 2  Pain Location: Finger (Comment which one) (4th) Pain Orientation: Left Pain Type: Acute pain  Precautions/Restrictions    Exercise/Treatments Hand Exercises Joint Blocking Exercises: AROM 10x 3ach DIP, PIP, MCP Purdue Pegboard: using hand gripper at gold at number 2 hole  - pt placed pegs into small pegboard.  pt neede cueing to sogo slow and maintain firm grip on gripper and peg before battempting to put peg into hole. attempted increased force in gripper (red on hole #1), but pt unable to maintain grasp for period of time to hold peg.  Other Hand Exercises: mancala game using small beads - pt utilized left 4th digit to pick up and move beads during game play.       Occupational Therapy Assessment and Plan OT Assessment and Plan Clinical Impression Statement: Played mancala game with left 4th digit and 1st digit this session - pt had good abilites in repeated functional use of digit and no complaints of pain.  attmpted gripping activity useing gripper - initial increase in force was difficulty for pt to hold in order to grasp pegs, to returned to level from previous session.  pt requred verbal cueing to go slowly  in order to maintain grasp on pegs to place them.  pt feels she is doing very well in therapy. Next seesion is her current last scheudled appointment - discussed progress and pt beleives she may be redy for re-eval. OT Plan: Re-Eval.   Goals Short Term Goals Short Term Goal 1: Patient will be educated on  HEP. Short Term Goal 1 Progress: Progressing toward goal Short Term Goal 2: Patient will improve left ring finger PIPJ flexion by 5 degrees for increased ability to keyboard and play piano. Short Term Goal 2 Progress: Progressing toward goal Short Term Goal 3: Patient will decrease completion time on Nine Hole Peg Test by 4 seconds in left hand for increased [place] needed to play piano. Short Term Goal 3 Progress: Progressing toward goal Long Term Goals Long Term Goal 1: Patient will return to prior level of independence with all B/IADLS, work, and leisure activities.  Long Term Goal 1 Progress: Progressing toward goal Long Term Goal 2: Patient will improve left ring finger PIPJ flexion by 10 degrees for increased ability to keyboard and play piano. Long Term Goal 2 Progress: Progressing toward goal Long Term Goal 3: Patient will decrease completion time on Nine Hole Peg Test by 8 seconds in left hand for increased [place] needed to play piano. Long Term Goal 3 Progress: Progressing toward goal Long Term Goal 4: Patient will improve left grip strength by 5 pounds and pinch strength by 1 pound for increased ability to open containers. Long Term Goal 4 Progress: Progressing toward goal Long Term Goal 5: Patient will have trace fascial restrictions in her left hand and forearm.  Long Term Goal 5 Progress: Progressing toward goal  Problem List Patient Active Problem List   Diagnosis Date Noted  . Lack of coordination 10/24/2013  . ISCHEMIC COLITIS 05/21/2009    End of  Session Activity Tolerance: Patient tolerated treatment well General Behavior During Therapy: [facility] for tasks assessed/performed  GO    [name], [place], OTR/L [phone]  11/09/2013, 4:24 PM

## 2013-11-14 ENCOUNTER — Ambulatory Visit (HOSPITAL_COMMUNITY)
Admission: RE | Admit: 2013-11-14 | Discharge: 2013-11-14 | Disposition: A | Discharge status: Home / Self Care | Payer: BC Managed Care – PPO | Source: Ambulatory Visit | Attending: Internal Medicine | Admitting: Internal Medicine

## 2013-11-14 ENCOUNTER — Telehealth (HOSPITAL_COMMUNITY): Payer: Self-pay

## 2013-11-14 NOTE — Telephone Encounter (Signed)
Monday, November 14, 2013 825 AM  Returned pt's phone call concerning "something going on" and uncertainty about coming to today's appointment. Pt verbalizes having increased tingling in her hand that began Thursday morning (after Wednesday appointment).  The tingling has improved some now, but pt remained concerned.  Discussed with pt, and she will be coming to OT appointment today.  [name], [place], OTR/L [phone]

## 2013-11-14 NOTE — Evaluation (Signed)
Occupational Therapy Re-Evaluation and Discharge  Patient Details  Name: Julia Martinez MRN: [id number] Date of Birth: 03/18/1951  Today's Date: 11/14/2013 Time: 1302-1336 OT Time Calculation (min): 34 min ROM Testing 1302-1315 (9') Self-Care 1315-1336 (21')  Visit#: 6 of 8  Re-eval:    Assessment Diagnosis: Left Hand Coordination Deficit  Authorization:    Authorization Time Period:    Authorization Visit#:   of     Past Medical History: No past medical history on file. Past Surgical History: No past surgical history on file.  Subjective Symptoms/Limitations Symptoms: "when we first started I could barely raise my finger.  and some of the stuff I've been playig (piano) has gotten better." Special Tests: FOTO scored 83/100    Initial 72% independent. Pain Assessment Currently in Pain?: No/denies  Assessment ADL/Vision/Perception ADL ADL Comments: both typing and playing piano have significanlty improved  Sensation/Coordination/Edema Coordination 9 Hole Peg Test:  current right 16.62  left 19.97  Previous  right 17.71" left 27.52" Edema Edema: Current: PIP 4th R: 5.5, L: 5.5    5th digit PIP L: 5cm, R 5cm   Previous: PIPJ of 4th digit Right 5.6 cm left 5.4 cm PIPJ of 5th digit right 5.0 cm left 4.5 cm  Additional Assessments LUE AROM (degrees) LUE Overall AROM Comments: Current: 4th: 82/95/73 5th digit: 79/89/78  Previous 4th digit 70/90/70 5th digit 80/90/80  LUE Strength Grip (lbs): 50 (Previous 49 left and 60 right) 3 Point Pinch: 6 lbs (with 1st, 4th, 5th digits (previous 4# R and L)) Palpation Palpation: no fascial restrictions noted Left Hand Strength - Pinch (lbs) 3 Point Pinch: 6 lbs (with 1st, 4th, 5th digits (previous 4# R and L))   Occupational Therapy Assessment and Plan OT Assessment and Plan Clinical Impression Statement: Pt presenting for OT re-eval this session. She has progressed well in therapy services and feels she is ready for d/c from therayp  services. Pt verbalizes no difficulties in ADL or IADL needs, including typing and playing piano. Pt has met all but on long term goal - and pt will continued to imporve grip strengthening throughout her daily tasks. pt verbalizes sliight tinging along median nerve distribution - pt verbalizes has improved since weekend, but OTR encourage pt to see MD if it persists. OT Plan: Pt is discharged from OT services   Goals Short Term Goals Short Term Goal 1: Patient will be educated on HEP. Short Term Goal 1 Progress: Met Short Term Goal 2: Patient will improve left ring finger PIPJ flexion by 5 degrees for increased ability to keyboard and play piano. Short Term Goal 2 Progress: Met Short Term Goal 3: Patient will decrease completion time on Nine Hole Peg Test by 4 seconds in left hand for increased [place] needed to play piano. Short Term Goal 3 Progress: Met Long Term Goals Long Term Goal 1: Patient will return to prior level of independence with all B/IADLS, work, and leisure activities.  Long Term Goal 1 Progress: Met Long Term Goal 2: Patient will improve left ring finger PIPJ flexion by 10 degrees for increased ability to keyboard and play piano. Long Term Goal 2 Progress: Met Long Term Goal 3: Patient will decrease completion time on Nine Hole Peg Test by 8 seconds in left hand for increased [place] needed to play piano. Long Term Goal 3 Progress: Met Long Term Goal 4: Patient will improve left grip strength by 5 pounds and pinch strength by 1 pound for increased ability to open containers. Long  Term Goal 4 Progress: Partly met Long Term Goal 5: Patient will have trace fascial restrictions in her left hand and forearm.  Long Term Goal 5 Progress: Met  Problem List Patient Active Problem List   Diagnosis Date Noted  . Lack of coordination 10/24/2013  . ISCHEMIC COLITIS 05/21/2009    End of Session Activity Tolerance: Patient tolerated treatment well General Behavior During Therapy: [facility] for  tasks assessed/performed  GO   [name], MS, OTR/L [phone]  11/14/2013, 1:38 PM  Physician Documentation Your signature is required to indicate approval of the treatment plan as stated above.  Please sign and either send electronically or make a copy of this report for your files and return this physician signed original.  Please mark one 1.__approve of plan  2. ___approve of plan with the following conditions.   ______________________________                                                          _____________________ Physician Signature                                                                                                             Date sbnr

## 2014-09-30 ENCOUNTER — Telehealth: Payer: Self-pay

## 2014-09-30 NOTE — Telephone Encounter (Signed)
ERROR

## 2015-11-01 ENCOUNTER — Telehealth (HOSPITAL_COMMUNITY): Payer: Self-pay | Admitting: *Deleted

## 2015-11-01 NOTE — Telephone Encounter (Signed)
returned phone call regarding an appointment.

## 2015-11-02 ENCOUNTER — Telehealth (HOSPITAL_COMMUNITY): Payer: Self-pay | Admitting: *Deleted

## 2015-11-02 NOTE — Telephone Encounter (Signed)
returned phone call regarding appointment.   left voice message.

## 2015-11-29 ENCOUNTER — Encounter (HOSPITAL_COMMUNITY): Payer: Self-pay | Admitting: Psychiatry

## 2015-11-29 ENCOUNTER — Ambulatory Visit (INDEPENDENT_AMBULATORY_CARE_PROVIDER_SITE_OTHER): Payer: BC Managed Care – PPO | Admitting: Psychiatry

## 2015-11-29 DIAGNOSIS — F331 Major depressive disorder, recurrent, moderate: Secondary | ICD-10-CM | POA: Diagnosis not present

## 2015-11-29 NOTE — Patient Instructions (Signed)
Discussed orally 

## 2015-12-03 NOTE — Progress Notes (Signed)
Comprehensive Clinical Assessment (CCA) Note  12/03/2015 Julia Martinez [id number]  Visit Diagnosis:      ICD-9-CM ICD-10-CM   1. Major depressive disorder, recurrent episode, moderate (HCC) 296.32 F33.1       CCA Part One  Part One has been completed on paper by the patient.  (See scanned document in Chart Review)  CCA Part Two A  Intake/Chief Complaint:  CCA Intake With Chief Complaint CCA Part Two Date: 11/29/15 CCA Part Two Time: 4268 Chief Complaint/Presenting Problem: There have been a lot of changes in my life. My 87 year old  son's behavior changed drasictically after his grandmother died in 09-30-2015. They were close. Son has diagnosis of Prader-Willi syndrome. He is taking anger out on me. I am also stressed by my job due to [employer] and recent publicity. I work in the [employer]. 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 I feel stuck in because I  can't retire until 2019. I have little support from family, they don't understand.  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 I alsp have had to leave the church I used to attend.. Patients Currently Reported Symptoms/Problems: depressed mood, poor motivation, loss of interest, excessive worry, sleep difficulty, anger, anxiety, irritability, low energy, panic attacks, isolative behaviors Type of Services Patient Feels Are Needed: Individual therapy Initial Clinical Notes/Concerns: Patient presents with a long standing history of ongoing symptoms of depression and anxiety that have worsened in the past few months since her mother-in law died in 09-30-2015. Her 59 year old son who has diagnosis of Prader Ollen Barges Syndrome and resided with grandmother until her death now resides full time with patient.. She says son is taking out his anger on her. She reports several other stressors including stressful  job and work environment. due to the  politics and  recent publicity about  where she works which is the [employer] office. She also reports stress related to managing the business affairs to settle her deceased mother-in-law's estate. She reports grief and loss issues related to recently having to leave her church due to conflict. Patient also reports marital stress. She is a returning patient as she was seen in this practice for therapy many years ago.. She has taken psychotropic medication briefly in the past.  Mental Health Symptoms Depression:  Depression: Change in energy/activity, Fatigue, Increase/decrease in appetite, Irritability, Sleep (too much or little), Tearfulness, Weight gain/loss  Mania:  Mania: N/A  Anxiety:   Anxiety: Difficulty concentrating, Fatigue, Irritability, Sleep, Tension, Worrying  Psychosis:  Psychosis: N/A  Trauma:  Trauma: N/A  Obsessions:  Obsessions: N/A  Compulsions:  Compulsions: N/A  Inattention:  Inattention: N/A  Hyperactivity/Impulsivity:  Hyperactivity/Impulsivity: N/A  Oppositional/Defiant Behaviors:  Oppositional/Defiant Behaviors: N/A  Borderline Personality:  Emotional Irregularity: N/A  Other Mood/Personality Symptoms:     Mental Status Exam Appearance and self-care  Stature:  Stature: Average  Weight:  Weight: Overweight  Clothing:  Clothing: Casual  Grooming:  Grooming: Well-groomed  Cosmetic use:  Cosmetic Use: Age appropriate  Posture/gait:  Posture/Gait: Normal  Motor activity:  Motor Activity: Not Remarkable  Sensorium  Attention:  Attention: Distractible  Concentration:  Concentration: Anxiety interferes  Orientation:  Orientation: Object, Person, Place, Situation, Time  Recall/memory:    Affect and Mood  Affect:  Affect: Anxious, Depressed  Mood:  Mood: Angry, Anxious, Depressed  Relating  Eye contact:  Eye Contact: Normal  Facial expression:  Facial Expression: Responsive  Attitude toward examiner:  Attitude Toward Examiner: Cooperative  Thought and Language  Speech flow: Speech  Flow: Normal  Thought content:  Thought Content: Appropriate to mood and circumstances  Preoccupation:  Preoccupations: Ruminations  Hallucinations:  Hallucinations:  (None)  Organization:  Logical  Transport planner of Knowledge:  Fund of Knowledge: Average  Intelligence:  Intelligence: Average  Abstraction:  Abstraction: Normal  Judgement:  Judgement: Normal  Reality Testing:  Reality Testing: Realistic  Insight:  Insight: Good  Decision Making:  Decision Making: Normal  Social Functioning  Social Maturity:  Social Maturity: Isolates  Social Judgement:  Social Judgement: Normal  Stress  Stressors:  Stressors: Family conflict, Transitions, Grief/losses, Work  Coping Ability:  Coping Ability: Overwhelmed, Research officer, political party Deficits:    Supports:     Family and Psychosocial History: Family history Marital status: Married Number of Years Married: 43 What types of issues is patient dealing with in the relationship?: Communication issues Are you sexually active?: No What is your sexual orientation?: Heterosexual Has your sexual activity been affected by drugs, alcohol, medication, or emotional stress?: emotional stress Does patient have children?: Yes How many children?: 3 How is patient's relationship with their children?: Patient reports oldest daughter is negative and domineering. She says she and yougest daughter are similar and have positive relationship. She reports strained relationship with son.  Childhood History:  Childhood History By whom was/is the patient raised?: Both parents Description of patient's relationship with caregiver when they were a child: Patient reports good relationship with parents. They were older but overprotective. They pushed her to excel. Patient was an only child.  Patient's description of current relationship with people who raised him/her: Parents are deceased. How were you disciplined when you got in trouble as a child/adolescent?:  Shamed into feeling bad about what I did. 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 long has patient been employed?: 16 years Patient's job has been impacted by current illness: Yes Describe how patient's job has been impacted: no desire to do work, has to force self to do work What is the longest time patient has a held a job?: 23 years Where was the patient employed at that time?: [employer] Has patient ever been in the [employer]?: No Has patient ever served in combat?: No Did You Receive Any Psychiatric Treatment/Services While in Passenger transport manager?: No Are There Guns or Other Weapons in [place]?: Yes Types of Guns/Weapons: handguns Are These Psychologist, educational?: Yes (vault in garage)  Education: Education Did Teacher, adult education From [employer]?: Yes Did Physicist, medical?: Yes What Type of College Degree Do you Have?: Associate's Degree in [employer] from [employer] Did You Have An Individualized Education Program (IIEP): No Did You Have Any Difficulty At [employer]?: No  Religion: Religion/Spirituality Are You A Religious Person?: Yes What is Your Religious Affiliation?: Baptist How Might This Affect Treatment?: no effect  Leisure/Recreation: Leisure / Recreation Leisure and Hobbies: reading, watching tv, watching sports  Exercise/Diet: Exercise/Diet Do You Exercise?: No  Have You Gained or Lost A Significant Amount of Weight in the  Past Six Months?: Yes-Gained Number of Pounds Gained: 20 Do You Follow a Special Diet?: No Do You Have Any Trouble Sleeping?: Yes Explanation of Sleeping Difficulties: excessuve sleeping or no sleeping  CCA Part Two C  Alcohol/Drug Use: Alcohol / Drug Use History of alcohol / drug use?: No history of alcohol / drug abuse    CCA Part Three  ASAM's:  Six Dimensions of Multidimensional Assessment N/A  Substance use Disorder (SUD) N/A  Social Function:  Social Functioning Social Maturity: Isolates Social Judgement: Normal  Stress:  Stress Stressors: Family conflict, Transitions, Grief/losses, Work Coping Ability: Overwhelmed, Exhausted Patient Takes Medications The Way The Doctor Instructed?: Yes Priority Risk: Moderate Risk  Risk Assessment- Self-Harm Potential: Risk Assessment For Self-Harm Potential Thoughts of Self-Harm: No current thoughts  Risk Assessment -Dangerous to Others Potential: Risk Assessment For Dangerous to Others Potential Method: No Plan Notification Required: No need or identified person  DSM5 Diagnoses: Patient Active Problem List   Diagnosis Date Noted  . Lack of coordination 10/24/2013  . ISCHEMIC COLITIS 05/21/2009    Patient Centered Plan: Patient is on the following Treatment Plan(s):  Major Depressive Disorder, Recurrent, Moderate  Recommendations for Services/Supports/Treatments: Recommendations for Services/Supports/Treatments Recommendations For Services/Supports/Treatments: Individual Therapy  Treatment Plan Summary: Patient attends the assessment appointment. Confidentiality and limits are discussed. The patient agrees to return for an appointment in 1-2 weeks for continuing assessment and treatment planning. Patient agrees to schedule an appointment with psychiatrist Dr. Harrington Challenger for medication evaluation. She agrees to call this practice, call 911, or have someone take her to the emergency room should symptoms worsen. Individual therapy  is recommended 1 time every 1-2 weeks to learn and implement behavioral strategies to overcome depression, learn and implement calming skills to manage overall anxiety, and to process grief and loss issues.  Referrals to Alternative Service(s): Referred to Alternative Service(s):   Place:   Date:   Time:    Referred to Alternative Service(s):   Place:   Date:   Time:    Referred to Alternative Service(s):   Place:   Date:   Time:    Referred to Alternative Service(s):   Place:   Date:   Time:     [place]

## 2015-12-18 ENCOUNTER — Telehealth (HOSPITAL_COMMUNITY): Payer: Self-pay | Admitting: *Deleted

## 2015-12-18 NOTE — Telephone Encounter (Signed)
received fax from patient to cancel all upcoming appointments.   She is going on vacation and will call if she wish to schedule further appointments.

## 2015-12-20 ENCOUNTER — Ambulatory Visit (HOSPITAL_COMMUNITY): Payer: Self-pay | Admitting: Psychiatry

## 2016-01-08 ENCOUNTER — Ambulatory Visit (HOSPITAL_COMMUNITY): Payer: Self-pay | Admitting: Psychiatry

## 2016-02-05 ENCOUNTER — Ambulatory Visit (HOSPITAL_COMMUNITY): Payer: Self-pay | Admitting: Psychiatry

## 2017-03-05 ENCOUNTER — Encounter (HOSPITAL_COMMUNITY): Payer: Self-pay | Admitting: Psychiatry

## 2017-03-05 NOTE — Progress Notes (Unsigned)
Outpatient Therapist Discharge Summary  Julia Martinez    03-10-1951   Admission Date: 11/29/2015 Discharge Date:  03/05/2017 Reason for Discharge:  Patient discontinued treatment Diagnosis:  Axis I:  Major Depressive Disorder    Comments:   Ladene Artist, LCSW 03/05/2017

## 2017-07-20 ENCOUNTER — Inpatient Hospital Stay (HOSPITAL_COMMUNITY)
Admission: EM | Admit: 2017-07-20 | Discharge: 2017-07-21 | DRG: 379 | Disposition: A | Discharge status: Home / Self Care | Payer: BC Managed Care – PPO | Attending: Internal Medicine | Admitting: Internal Medicine

## 2017-07-20 ENCOUNTER — Emergency Department (HOSPITAL_COMMUNITY): Payer: BC Managed Care – PPO

## 2017-07-20 ENCOUNTER — Encounter (HOSPITAL_COMMUNITY)
Admission: EM | Disposition: A | Discharge status: Home / Self Care | Payer: Self-pay | Source: Home / Self Care | Attending: Internal Medicine

## 2017-07-20 ENCOUNTER — Other Ambulatory Visit: Payer: Self-pay

## 2017-07-20 ENCOUNTER — Encounter (HOSPITAL_COMMUNITY): Payer: Self-pay | Admitting: Emergency Medicine

## 2017-07-20 DIAGNOSIS — I10 Essential (primary) hypertension: Secondary | ICD-10-CM | POA: Diagnosis present

## 2017-07-20 DIAGNOSIS — Z6834 Body mass index (BMI) 34.0-34.9, adult: Secondary | ICD-10-CM | POA: Diagnosis not present

## 2017-07-20 DIAGNOSIS — K219 Gastro-esophageal reflux disease without esophagitis: Secondary | ICD-10-CM | POA: Diagnosis present

## 2017-07-20 DIAGNOSIS — K5750 Diverticulosis of both small and large intestine without perforation or abscess without bleeding: Secondary | ICD-10-CM | POA: Diagnosis present

## 2017-07-20 DIAGNOSIS — E669 Obesity, unspecified: Secondary | ICD-10-CM | POA: Diagnosis not present

## 2017-07-20 DIAGNOSIS — R911 Solitary pulmonary nodule: Secondary | ICD-10-CM | POA: Diagnosis present

## 2017-07-20 DIAGNOSIS — K922 Gastrointestinal hemorrhage, unspecified: Secondary | ICD-10-CM | POA: Diagnosis present

## 2017-07-20 DIAGNOSIS — I493 Ventricular premature depolarization: Secondary | ICD-10-CM | POA: Diagnosis present

## 2017-07-20 DIAGNOSIS — K449 Diaphragmatic hernia without obstruction or gangrene: Secondary | ICD-10-CM | POA: Diagnosis present

## 2017-07-20 DIAGNOSIS — K921 Melena: Secondary | ICD-10-CM | POA: Diagnosis present

## 2017-07-20 DIAGNOSIS — Z23 Encounter for immunization: Secondary | ICD-10-CM

## 2017-07-20 DIAGNOSIS — D649 Anemia, unspecified: Secondary | ICD-10-CM | POA: Diagnosis not present

## 2017-07-20 DIAGNOSIS — Z880 Allergy status to penicillin: Secondary | ICD-10-CM

## 2017-07-20 DIAGNOSIS — K644 Residual hemorrhoidal skin tags: Secondary | ICD-10-CM | POA: Diagnosis present

## 2017-07-20 DIAGNOSIS — Z79899 Other long term (current) drug therapy: Secondary | ICD-10-CM

## 2017-07-20 DIAGNOSIS — D500 Iron deficiency anemia secondary to blood loss (chronic): Secondary | ICD-10-CM | POA: Diagnosis present

## 2017-07-20 HISTORY — PX: ESOPHAGOGASTRODUODENOSCOPY: SHX5428

## 2017-07-20 LAB — COMPREHENSIVE METABOLIC PANEL
ALK PHOS: 102 U/L (ref 38–126)
ALT: 27 U/L (ref 14–54)
AST: 24 U/L (ref 15–41)
Albumin: 3.4 g/dL — ABNORMAL LOW (ref 3.5–5.0)
Anion gap: 9 (ref 5–15)
BILIRUBIN TOTAL: 0.2 mg/dL — AB (ref 0.3–1.2)
BUN: 20 mg/dL (ref 6–20)
CALCIUM: 9.9 mg/dL (ref 8.9–10.3)
CO2: 22 mmol/L (ref 22–32)
CREATININE: 0.88 mg/dL (ref 0.44–1.00)
Chloride: 106 mmol/L (ref 101–111)
GFR calc non Af Amer: >60 mL/min (ref >60)
Glucose, Bld: 198 mg/dL — ABNORMAL HIGH (ref 65–99)
Potassium: 4.5 mmol/L (ref 3.5–5.1)
SODIUM: 137 mmol/L (ref 135–145)
TOTAL PROTEIN: 7.6 g/dL (ref 6.5–8.1)

## 2017-07-20 LAB — CBC WITH DIFFERENTIAL/PLATELET
Basophils Absolute: 0.1 10*3/uL (ref 0.0–0.1)
Basophils Relative: 1 %
EOS ABS: 0.1 10*3/uL (ref 0.0–0.7)
EOS PCT: 1 %
HCT: 32.9 % — ABNORMAL LOW (ref 36.0–46.0)
Hemoglobin: 9.7 g/dL — ABNORMAL LOW (ref 12.0–15.0)
LYMPHS ABS: 1 10*3/uL (ref 0.7–4.0)
Lymphocytes Relative: 10 %
MCH: 24.4 pg — AB (ref 26.0–34.0)
MCHC: 29.5 g/dL — AB (ref 30.0–36.0)
MCV: 82.9 fL (ref 78.0–100.0)
Monocytes Absolute: 0.6 10*3/uL (ref 0.1–1.0)
Monocytes Relative: 6 %
Neutro Abs: 8.6 10*3/uL — ABNORMAL HIGH (ref 1.7–7.7)
Neutrophils Relative %: 84 %
PLATELETS: 361 10*3/uL (ref 150–400)
RBC: 3.97 MIL/uL (ref 3.87–5.11)
RDW: 15.8 % — ABNORMAL HIGH (ref 11.5–15.5)
WBC: 10.2 10*3/uL (ref 4.0–10.5)

## 2017-07-20 LAB — TROPONIN I: Troponin I: <0.03 ng/mL (ref <0.03)

## 2017-07-20 LAB — URINALYSIS, ROUTINE W REFLEX MICROSCOPIC
BILIRUBIN URINE: NEGATIVE
GLUCOSE, UA: NEGATIVE mg/dL
Hgb urine dipstick: NEGATIVE
KETONES UR: NEGATIVE mg/dL
Leukocytes, UA: NEGATIVE
NITRITE: NEGATIVE
PH: 6 (ref 5.0–8.0)
Protein, ur: NEGATIVE mg/dL
Specific Gravity, Urine: 1.035 — ABNORMAL HIGH (ref 1.005–1.030)

## 2017-07-20 LAB — TYPE AND SCREEN
ABO/RH(D): A POS
Antibody Screen: NEGATIVE

## 2017-07-20 LAB — HEMOGLOBIN AND HEMATOCRIT, BLOOD
HCT: 30.9 % — ABNORMAL LOW (ref 36.0–46.0)
HCT: 32.1 % — ABNORMAL LOW (ref 36.0–46.0)
Hemoglobin: 9.3 g/dL — ABNORMAL LOW (ref 12.0–15.0)
Hemoglobin: 9.5 g/dL — ABNORMAL LOW (ref 12.0–15.0)

## 2017-07-20 LAB — PROTIME-INR
INR: 1.01
PROTHROMBIN TIME: 13.2 s (ref 11.4–15.2)

## 2017-07-20 LAB — POC OCCULT BLOOD, ED: Fecal Occult Bld: POSITIVE — AB

## 2017-07-20 SURGERY — EGD (ESOPHAGOGASTRODUODENOSCOPY)
Anesthesia: Moderate Sedation

## 2017-07-20 MED ORDER — ONDANSETRON HCL 4 MG/2ML IJ SOLN
INTRAMUSCULAR | Status: DC | PRN
  Administered 2017-07-20: 4 mg via INTRAVENOUS

## 2017-07-20 MED ORDER — SODIUM CHLORIDE 0.9 % IV SOLN
INTRAVENOUS | Status: DC
  Administered 2017-07-20: 16:00:00 via INTRAVENOUS

## 2017-07-20 MED ORDER — SODIUM CHLORIDE 0.9 % IV SOLN: INTRAVENOUS | Status: DC

## 2017-07-20 MED ORDER — SODIUM CHLORIDE 0.9 % IV SOLN
80.0000 mg | Freq: Once | INTRAVENOUS | Status: AC
  Administered 2017-07-20: 80 mg via INTRAVENOUS
  Filled 2017-07-20: qty 80

## 2017-07-20 MED ORDER — ACETAMINOPHEN 650 MG RE SUPP: 650.0000 mg | Freq: Four times a day (QID) | RECTAL | Status: DC | PRN

## 2017-07-20 MED ORDER — ACETAMINOPHEN 325 MG PO TABS: 650.0000 mg | ORAL_TABLET | Freq: Four times a day (QID) | ORAL | Status: DC | PRN

## 2017-07-20 MED ORDER — PANTOPRAZOLE SODIUM 40 MG PO TBEC
40.0000 mg | DELAYED_RELEASE_TABLET | Freq: Two times a day (BID) | ORAL | Status: DC
  Administered 2017-07-20 – 2017-07-21 (×2): 40 mg via ORAL
  Filled 2017-07-20 (×2): qty 1

## 2017-07-20 MED ORDER — LIDOCAINE VISCOUS 2 % MT SOLN
OROMUCOSAL | Status: AC
  Filled 2017-07-20: qty 15

## 2017-07-20 MED ORDER — PANTOPRAZOLE SODIUM 40 MG IV SOLR
40.0000 mg | Freq: Once | INTRAVENOUS | Status: DC
  Filled 2017-07-20: qty 40

## 2017-07-20 MED ORDER — POLYETHYLENE GLYCOL 3350 17 G PO PACK
17.0000 g | PACK | ORAL | Status: AC
  Administered 2017-07-20 (×6): 17 g via ORAL
  Filled 2017-07-20 (×3): qty 1

## 2017-07-20 MED ORDER — MEPERIDINE HCL 100 MG/ML IJ SOLN
INTRAMUSCULAR | Status: DC | PRN
  Administered 2017-07-20: 50 mg via INTRAVENOUS

## 2017-07-20 MED ORDER — METOPROLOL SUCCINATE ER 50 MG PO TB24
200.0000 mg | ORAL_TABLET | Freq: Every day | ORAL | Status: DC
  Administered 2017-07-20: 200 mg via ORAL
  Filled 2017-07-20: qty 4

## 2017-07-20 MED ORDER — PANTOPRAZOLE SODIUM 40 MG IV SOLR
8.0000 mg/h | INTRAVENOUS | Status: DC
  Administered 2017-07-20: 8 mg/h via INTRAVENOUS
  Filled 2017-07-20 (×3): qty 80

## 2017-07-20 MED ORDER — ONDANSETRON HCL 4 MG/2ML IJ SOLN
INTRAMUSCULAR | Status: AC
  Filled 2017-07-20: qty 2

## 2017-07-20 MED ORDER — MEPERIDINE HCL 100 MG/ML IJ SOLN
INTRAMUSCULAR | Status: AC
  Filled 2017-07-20: qty 2

## 2017-07-20 MED ORDER — ONDANSETRON HCL 4 MG PO TABS: 4.0000 mg | ORAL_TABLET | Freq: Four times a day (QID) | ORAL | Status: DC | PRN

## 2017-07-20 MED ORDER — IOPAMIDOL (ISOVUE-300) INJECTION 61%
100.0000 mL | Freq: Once | INTRAVENOUS | Status: AC | PRN
  Administered 2017-07-20: 100 mL via INTRAVENOUS

## 2017-07-20 MED ORDER — FAMOTIDINE IN NACL 20-0.9 MG/50ML-% IV SOLN
20.0000 mg | Freq: Once | INTRAVENOUS | Status: AC
  Administered 2017-07-20: 20 mg via INTRAVENOUS
  Filled 2017-07-20: qty 50

## 2017-07-20 MED ORDER — MIDAZOLAM HCL 5 MG/5ML IJ SOLN
INTRAMUSCULAR | Status: DC | PRN
  Administered 2017-07-20: 2 mg via INTRAVENOUS
  Administered 2017-07-20: 1 mg via INTRAVENOUS

## 2017-07-20 MED ORDER — DILTIAZEM HCL ER BEADS 240 MG PO CP24
360.0000 mg | ORAL_CAPSULE | Freq: Every day | ORAL | Status: DC
  Administered 2017-07-20: 22:00:00 360 mg via ORAL
  Filled 2017-07-20 (×4): qty 1

## 2017-07-20 MED ORDER — ONDANSETRON HCL 4 MG/2ML IJ SOLN: 4.0000 mg | Freq: Four times a day (QID) | INTRAMUSCULAR | Status: DC | PRN

## 2017-07-20 MED ORDER — MIDAZOLAM HCL 5 MG/5ML IJ SOLN
INTRAMUSCULAR | Status: AC
  Filled 2017-07-20: qty 10

## 2017-07-20 MED ORDER — BISACODYL 5 MG PO TBEC
10.0000 mg | DELAYED_RELEASE_TABLET | Freq: Once | ORAL | Status: AC
  Administered 2017-07-20: 10 mg via ORAL
  Filled 2017-07-20: qty 2

## 2017-07-20 NOTE — ED Triage Notes (Addendum)
PT c/o generalized weakness, acid reflux, increased anxiety x1 month. PT c/o dark tarry stools that started yesterday and states she was sitting at her desk this morning and started feeling lightheaded.

## 2017-07-20 NOTE — ED Provider Notes (Signed)
[facility] EMERGENCY DEPARTMENT Provider Note   CSN: [id number] Arrival date & time: 07/20/17  0388     History   Chief Complaint Chief Complaint  Patient presents with  . Weakness    HPI Julia Martinez is a 67 y.o. female.   HPI Pt was seen at 0925. Per pt, c/o gradual onset and worsening of persistent upper abd "pain" for the past 1 to 3 months. Describes the abd pain as "burning" with radiation into her throat. Pt states yesterday she started to have "dark tarry stools" and today felt "lightheaded" and generally weak. Denies back pain, no CP/palpitations, no SOB/cough, no fevers, no rash, no N/V/D, no focal motor weakness, no tingling/numbness in extremities.    Past Medical History:  Diagnosis Date  . Arrhythmia   . Depression   . High blood pressure     Patient Active Problem List   Diagnosis Date Noted  . Lack of coordination 10/24/2013  . ISCHEMIC COLITIS 05/21/2009    Past Surgical History:  Procedure Laterality Date  . CESAREAN SECTION      OB History    No data available       Home Medications    Prior to Admission medications   Medication Sig Start Date End Date Taking? Authorizing Provider  diltiazem (TIAZAC) 360 MG 24 hr capsule Take 360 mg by mouth daily.    [provider]  metoprolol (TOPROL-XL) 200 MG 24 hr tablet Take 200 mg by mouth daily.    [provider]    Family History Family History  Problem Relation Age of Onset  . Depression Mother     Social History Social History   Tobacco Use  . Smoking status: Never Smoker  . Smokeless tobacco: Never Used  Substance Use Topics  . Alcohol use: No  . Drug use: No     Allergies   Penicillins   Review of Systems Review of Systems ROS: Statement: All systems negative except as marked or noted in the HPI; Constitutional: Negative for fever and chills. +generalized weakness. ; ; Eyes: Negative for eye pain, redness and discharge. ; ; ENMT: Negative for ear  pain, hoarseness, nasal congestion, sinus pressure and sore throat. ; ; Cardiovascular: Negative for chest pain, palpitations, diaphoresis, dyspnea and peripheral edema. ; ; Respiratory: Negative for cough, wheezing and stridor. ; ; Gastrointestinal: Negative for nausea, vomiting, diarrhea, hematemesis, jaundice and +black stools, abd pain.. ; ; Genitourinary: Negative for dysuria, flank pain and hematuria. ; ; Musculoskeletal: Negative for back pain and neck pain. Negative for swelling and trauma.; ; Skin: Negative for pruritus, rash, abrasions, blisters, bruising and skin lesion.; ; Neuro: +lightheadedness. Negative for headache and neck stiffness. Negative for altered level of consciousness, altered mental status, extremity weakness, paresthesias, involuntary movement, seizure and syncope.      Physical Exam Updated Vital Signs BP 133/62 (BP Location: Right Arm)   Pulse (!) 55   Temp 98.3 F (36.8 C) (Oral)   Resp 20   Ht 5' 4"  (8.280 m)   Wt 90.7 kg (200 lb)   SpO2 98%   BMI 34.33 kg/m    09:50 Orthostatic Vital Signs FS  Orthostatic Lying   BP- Lying: 141/64  Pulse- Lying: 56      Orthostatic Sitting  BP- Sitting: 133/65  Pulse- Sitting: 55      Orthostatic Standing at 0 minutes  BP- Standing at 0 minutes:  128/115  Pulse- Standing at 0 minutes: 57    Physical  Exam 0930: Physical examination:  Nursing notes reviewed; Vital signs and O2 SAT reviewed;  Constitutional: Well developed, Well nourished, Well hydrated, In no acute distress; Head:  Normocephalic, atraumatic; Eyes: EOMI, PERRL, No scleral icterus; ENMT: Mouth and pharynx normal, Mucous membranes moist; Neck: Supple, Full range of motion, No lymphadenopathy; Cardiovascular: Regular rate and rhythm, No gallop; Respiratory: Breath sounds clear & equal bilaterally, No wheezes.  Speaking full sentences with ease, Normal respiratory effort/excursion; Chest: Nontender, Movement normal; Abdomen: Soft, +mid-epigastric, LUQ >  diffuse tenderness to palp. Nondistended, Normal bowel sounds. Rectal exam performed w/permission of pt and ED RN chaperone present.  Anal tone normal.  Non-tender, black stool in rectal vault, heme positive.  No fissures, +external hemorrhoids without thrombosis or obvious bleeding. No palp masses.; Genitourinary: No CVA tenderness; Extremities: Pulses normal, No tenderness, No edema, No calf edema or asymmetry.; Neuro: AA&Ox3, Major CN grossly intact.  Speech clear. No gross focal motor or sensory deficits in extremities.; Skin: Color normal, Warm, Dry.; Psych:  Anxious.    ED Treatments / Results  Labs (all labs ordered are listed, but only abnormal results are displayed)   EKG  EKG Interpretation  Date/Time:  Monday July 20 2017 09:49:11 EST Ventricular Rate:  53 PR Interval:    QRS Duration: 123 QT Interval:  437 QTC Calculation: 411 R Axis:   68 Text Interpretation:  Normal sinus rhythm Ventricular premature complex Right bundle branch block Abnormal T, consider ischemia, lateral leads Baseline wander Artifact When compared with ECG of 07/09/2009 Normal sinus rhythm has replaced Atrial fibrillation Confirmed by Francine Graven [phone]) on 07/20/2017 10:36:38 AM       Radiology   Procedures Procedures (including critical care time)  Medications Ordered in ED Medications  famotidine (PEPCID) IVPB 20 mg premix (20 mg Intravenous New Bag/Given 07/20/17 1034)  pantoprazole (PROTONIX) 80 mg in sodium chloride 0.9 % 100 mL IVPB (not administered)  pantoprazole (PROTONIX) 80 mg in sodium chloride 0.9 % 250 mL (0.32 mg/mL) infusion (not administered)     Initial Impression / Assessment and Plan / ED Course  I have reviewed the triage vital signs and the nursing notes.  Pertinent labs & imaging results that were available during my care of the patient were reviewed by me and considered in my medical decision making (see chart for details).  MDM Reviewed: previous chart, nursing  note and vitals Reviewed previous: labs and ECG Interpretation: labs, ECG, x-ray and CT scan   Results for orders placed or performed during the hospital encounter of 07/20/17  Comprehensive metabolic panel  Result Value Ref Range   Sodium 137 135 - 145 mmol/L   Potassium 4.5 3.5 - 5.1 mmol/L   Chloride 106 101 - 111 mmol/L   CO2 22 22 - 32 mmol/L   Glucose, Bld 198 (H) 65 - 99 mg/dL   BUN 20 6 - 20 mg/dL   Creatinine, Ser 0.88 0.44 - 1.00 mg/dL   Calcium 9.9 8.9 - 10.3 mg/dL   Total Protein 7.6 6.5 - 8.1 g/dL   Albumin 3.4 (L) 3.5 - 5.0 g/dL   AST 24 15 - 41 U/L   ALT 27 14 - 54 U/L   Alkaline Phosphatase 102 38 - 126 U/L   Total Bilirubin 0.2 (L) 0.3 - 1.2 mg/dL   GFR calc non Af Amer >60 >60 mL/min   GFR calc Af Amer >60 >60 mL/min   Anion gap 9 5 - 15  CBC with Differential  Result Value Ref Range  WBC 10.2 4.0 - 10.5 K/uL   RBC 3.97 3.87 - 5.11 MIL/uL   Hemoglobin 9.7 (L) 12.0 - 15.0 g/dL   HCT 32.9 (L) 36.0 - 46.0 %   MCV 82.9 78.0 - 100.0 fL   MCH 24.4 (L) 26.0 - 34.0 pg   MCHC 29.5 (L) 30.0 - 36.0 g/dL   RDW 15.8 (H) 11.5 - 15.5 %   Platelets 361 150 - 400 K/uL   Neutrophils Relative % 84 %   Neutro Abs 8.6 (H) 1.7 - 7.7 K/uL   Lymphocytes Relative 10 %   Lymphs Abs 1.0 0.7 - 4.0 K/uL   Monocytes Relative 6 %   Monocytes Absolute 0.6 0.1 - 1.0 K/uL   Eosinophils Relative 1 %   Eosinophils Absolute 0.1 0.0 - 0.7 K/uL   Basophils Relative 1 %   Basophils Absolute 0.1 0.0 - 0.1 K/uL  Protime-INR  Result Value Ref Range   Prothrombin Time 13.2 11.4 - 15.2 seconds   INR 1.01   Troponin I  Result Value Ref Range   Troponin I <0.03 <0.03 ng/mL  POC occult blood, ED  Result Value Ref Range   Fecal Occult Bld POSITIVE (A) NEGATIVE  Type and screen  Result Value Ref Range   ABO/RH(D) A POS    Antibody Screen NEG    Sample Expiration      07/23/2017 Performed at [facility], [address] [place], [place] [address]    Dg Chest 2 View Result Date:  07/20/2017 CLINICAL DATA:  Abdominal pain for months. Melena. Weakness this morning. EXAM: CHEST  2 VIEW COMPARISON:  11/18/2003 FINDINGS: Cardiac silhouette is mildly enlarged. No mediastinal or hilar masses. Mild linear opacity at the right lung base is consistent with atelectasis. Lungs are otherwise clear. No convincing pleural effusion. No pneumothorax. Skeletal structures are demineralized but grossly intact. IMPRESSION: No acute cardiopulmonary disease. Electronically Signed   By: Lajean Manes M.D.   On: 07/20/2017 11:26   Ct Abdomen Pelvis W Contrast Result Date: 07/20/2017 CLINICAL DATA:  PT c/o generalized weakness, acid reflux, increased anxiety x1 month. PT c/o dark tarry stools that started yesterday and states she was sitting at her desk this morning and started feeling lightheaded EXAM: CT ABDOMEN AND PELVIS WITH CONTRAST TECHNIQUE: Multidetector CT imaging of the abdomen and pelvis was performed using the standard protocol following bolus administration of intravenous contrast. CONTRAST:  161m ISOVUE-300 IOPAMIDOL (ISOVUE-300) INJECTION 61% COMPARISON:  CT abdomen dated 05/05/2008. FINDINGS: Lower chest: 3 mm pulmonary nodule within the right lower lobe, new compared to previous CT of 10/30/2007, this level not imaged on interval abdomen CTs (series 5, image 8). Lung bases otherwise clear. Hepatobiliary: Small layering stones within the otherwise normal-appearing gallbladder. Liver is unremarkable, previously demonstrated hemangioma within the right liver lobe is not clearly seen on today's exam. No bile duct dilatation. Pancreas: Unremarkable. No pancreatic ductal dilatation or surrounding inflammatory changes. Spleen: Normal in size without focal abnormality. Adrenals/Urinary Tract: Adrenal glands are unremarkable bilaterally. Incidental note made of focal thickening within the medial limb of the right adrenal gland. Kidneys are unremarkable without mass, stone or hydronephrosis. No perinephric  edema. No ureteral or bladder calculi identified. Bladder appears normal. Stomach/Bowel: No dilated large or small bowel loops. Scattered diverticulosis throughout the colon, most prominent within the sigmoid colon, but no focal inflammatory change to suggest acute diverticulitis. Appendix is normal. Stomach is normal. Vascular/Lymphatic: Aortic atherosclerosis. No enlarged abdominal or pelvic lymph nodes. Reproductive: Uterus and bilateral adnexa are  unremarkable. Other: No free fluid or abscess collection. No free intraperitoneal air. Musculoskeletal: Mild degenerative change within the slightly scoliotic thoracolumbar spine. No acute or suspicious osseous finding. Small periumbilical abdominal wall hernia which contains fat only. Superficial soft tissues otherwise unremarkable. IMPRESSION: 1. No acute findings within the abdomen or pelvis. 2. Colonic diverticulosis without evidence of acute diverticulitis. No bowel obstruction or evidence of bowel wall inflammation. 3. 3 mm pulmonary nodule within the right lower lobe, new compared to a previous CT of 10/30/2007. No follow-up needed if patient is low-risk. Non-contrast chest CT can be considered in 12 months if patient is high-risk. This recommendation follows the consensus statement: Guidelines for Management of Incidental Pulmonary Nodules Detected on CT Images: From the Fleischner Society 2017; Radiology 2017; 284:228-243. 4. Cholelithiasis without evidence of acute cholecystitis. 5. Aortic atherosclerosis. Electronically Signed   By: Franki Cabot M.D.   On: 07/20/2017 11:53    Results for GRACIEMAE, DELISLE (MRN [id number]) as of 07/20/2017 13:39  Ref. Range 10/30/2007 12:10 07/08/2009 23:30 07/20/2017 09:50  Hemoglobin Latest Ref Range: 12.0 - 15.0 g/dL 14.0 13.4 9.7 (L)  HCT Latest Ref Range: 36.0 - 46.0 % 40.5 39.7 32.9 (L)   1250:  H/H lower than previous on file. Not orthostatic on VS. T/C returned from GI Dr. Gala Romney, case discussed, including:  HPI,  pertinent PM/SHx, VS/PE, dx testing, ED course and treatment:  Agreeable to consult, requests to keep NPO for likely EGD today, admit to [facility] service.  1315:  T/C returned from [facility] Dr. Clementeen Graham, case discussed, including:  HPI, pertinent PM/SHx, VS/PE, dx testing, ED course and treatment:  Agreeable to admit.     Final Clinical Impressions(s) / ED Diagnoses   Final diagnoses:  None    ED Discharge Orders    None       Francine Graven, DO 07/23/17 2141

## 2017-07-20 NOTE — H&P (Signed)
[facility] H&P   Patient Demographics:    Julia Martinez, is a 67 y.o. female  MRN: [id number]   DOB - 13-Dec-1950  Admit Date - 07/20/2017  Outpatient Primary MD for the patient is Celene Squibb, MD  Referring MD: Dr. Thurnell Garbe  Outpatient Specialists: None  Patient coming from: Home  Chief Complaint  Patient presents with  . Weakness      HPI:    Julia Martinez  is a 67 y.o. female, with history of arrhythmias, hypertension and obesity presented to the ED with generalized weakness and off and on burning epigastric pain with reflux symptoms for past 3-4 months.  Patient reports being under a lot of personal stress for the past 2 months.  Yesterday she noticed one episode of dark stools and then this morning another episode of stool that were burgundy in color.  Reports having a few episodes of watery diarrhea last week.  Denies any hematemesis, headache, fevers, chills, nausea, vomiting, abdominal cramping, dysuria.  She does report some dizziness and occasional lightheadedness.  Denies any change in her appetite, weight loss, recent sick contact, illness or fever.  Denies use of NSAIDs, aspirin or Goody powder.  Denies smoking or alcohol use. She reports having a normal colonoscopy several years back.  She reports having a history of diverticulosis with diverticulitis.  She reports that her last blood work was done about a year ago and thinks her hemoglobin was normal.  In the ED vitals were stable except for mildly elevated blood pressure.  Blood work showed hemoglobin of 9.7 (last labs in the system 7 years back with hemoglobin of about 14).  Normal platelets.  Normal electrolytes.  Stool for Hemoccult was positive. Chest x-ray was unremarkable while a CT of the abdomen and pelvis was negative for acute abdominal findings, showed colonic diverticulosis without diverticulitis.  Also showed  cholelithiasis without acute cholecystitis.  EKG showed sinus bradycardia at 53 with PVCs.   Review of systems:    In addition to the HPI above, (positive symptoms in bold) No Fever-chills, generalized weakness No Headache, No changes with Vision or hearing, No problems swallowing food or Liquids, No Chest pain, Cough or Shortness of Breath, Epigastric abdominal pain, melena, no nausea or vomiting, No Blood in stool or Urine, No dysuria, No new skin rashes or bruises, No new joints pains-aches,  No  tingling, numbness in any extremity, No recent weight gain or loss, No polyuria, polydypsia or polyphagia, mental stressors+    With Past History of the following :    Past Medical History:  Diagnosis Date  . Arrhythmia    remote cardiologist  . Depression   . High blood pressure       Past Surgical History:  Procedure Laterality Date  . CESAREAN SECTION    . COLONOSCOPY  2009   single external hemorrhoid tag, otherwise normal rectum.  Numerous left-sided diverticula, abnormal IC valve and TI suggestive of Crohn's disease s/p biopsy. Biopsies with non-specific ulcerations. CTE then revealing chronic inflammatory change of distal TI, concerning for SB Crohn's. Did not start therapy due to absence of clinical symptoms and recommended [facility] evaluation.  . WISDOM TOOTH EXTRACTION        Social History:     Social History   Tobacco Use  . Smoking status: Never Smoker  . Smokeless tobacco: Never Used  Substance Use Topics  . Alcohol use: No     Lives -home with husband  Mobility -independent     Family History :     Family History  Problem Relation Age of Onset  . Depression Mother   . Ulcers Mother   . Colon cancer Neg Hx   . Colon polyps Neg Hx       Home Medications:   Prior to Admission medications   Medication Sig Start Date End Date Taking? Authorizing Provider  diltiazem (TIAZAC) 360 MG 24 hr capsule Take 360 mg by mouth daily.   Yes [provider]  metoprolol (TOPROL-XL) 200 MG 24 hr tablet Take 200 mg by mouth daily.   Yes [provider]     Allergies:     Allergies  Allergen Reactions  . Penicillins Nausea Only     Physical Exam:   Vitals  Blood pressure (!) 154/78, pulse 61, temperature 98.5 F (36.9 C), temperature source Oral, resp. rate (!) 23, height 5' 4"  (1.626 m), weight 90.7 kg (200 lb), SpO2 96 %.   General: Middle-aged obese female lying in bed not in distress HEENT: Pupils reactive bilaterally, EOMI, pallor present, no icterus, moist oral mucosa, supple neck, no cervical lymphadenopathy Chest: Clear to auscultation bilaterally, no added sounds CVS: Normal S1 and S2, no murmurs rubs gallops GI: Soft, nondistended, nontender, bowel sounds present Musculoskeletal: Warm, trace edema, normal skin CNS: Alert and oriented, nonfocal     Data Review:    CBC Recent Labs  Lab 07/20/17 0950  WBC 10.2  HGB 9.7*  HCT 32.9*  PLT 361  MCV 82.9  MCH 24.4*  MCHC 29.5*  RDW 15.8*  LYMPHSABS 1.0  MONOABS 0.6  EOSABS 0.1  BASOSABS 0.1   ------------------------------------------------------------------------------------------------------------------  Chemistries  Recent Labs  Lab 07/20/17 0950  NA 137  K 4.5  CL 106  CO2 22  GLUCOSE 198*  BUN 20  CREATININE 0.88  CALCIUM 9.9  AST 24  ALT 27  ALKPHOS 102  BILITOT 0.2*   ------------------------------------------------------------------------------------------------------------------ estimated creatinine clearance is 70.5 mL/min (by C-G formula based on SCr of 0.88 mg/dL). ------------------------------------------------------------------------------------------------------------------ No results for input(s): TSH, T4TOTAL, T3FREE, THYROIDAB in the last 72 hours.  Invalid input(s): FREET3  Coagulation profile Recent Labs  Lab 07/20/17 0950  INR 1.01    ------------------------------------------------------------------------------------------------------------------- No results for input(s): DDIMER in the last 72 hours. -------------------------------------------------------------------------------------------------------------------  Cardiac Enzymes Recent Labs  Lab 07/20/17 0950  TROPONINI <0.03   ------------------------------------------------------------------------------------------------------------------ No results found for: BNP   ---------------------------------------------------------------------------------------------------------------  Urinalysis    Component Value Date/Time   COLORURINE STRAW (A) 07/20/2017 1243   APPEARANCEUR CLEAR 07/20/2017 1243   LABSPEC 1.035 (H) 07/20/2017 1243   PHURINE 6.0 07/20/2017 1243   GLUCOSEU NEGATIVE 07/20/2017 1243   HGBUR NEGATIVE 07/20/2017 1243   [place] NEGATIVE 07/20/2017 1243   KETONESUR NEGATIVE 07/20/2017 1243   PROTEINUR NEGATIVE 07/20/2017 1243   NITRITE NEGATIVE 07/20/2017 1243   LEUKOCYTESUR NEGATIVE 07/20/2017 1243    ----------------------------------------------------------------------------------------------------------------  Imaging Results:    Dg Chest 2 View  Result Date: 07/20/2017 CLINICAL DATA:  Abdominal pain for months. Melena. Weakness this morning. EXAM: CHEST  2 VIEW COMPARISON:  11/18/2003 FINDINGS: Cardiac silhouette is mildly enlarged. No mediastinal or hilar masses. Mild linear opacity at the right lung base is consistent with atelectasis. Lungs are otherwise clear. No convincing pleural effusion. No pneumothorax. Skeletal structures are demineralized but grossly intact. IMPRESSION: No acute cardiopulmonary disease. Electronically Signed   By: Lajean Manes M.D.   On: 07/20/2017 11:26   Ct Abdomen Pelvis W Contrast  Result Date: 07/20/2017 CLINICAL DATA:  PT c/o generalized weakness, acid reflux, increased anxiety x1 month. PT c/o dark  tarry stools that started yesterday and states she was sitting at her desk this morning and started feeling lightheaded EXAM: CT ABDOMEN AND PELVIS WITH CONTRAST TECHNIQUE: Multidetector CT imaging of the abdomen and pelvis was performed using the standard protocol following bolus administration of intravenous contrast. CONTRAST:  127m ISOVUE-300 IOPAMIDOL (ISOVUE-300) INJECTION 61% COMPARISON:  CT abdomen dated 05/05/2008. FINDINGS: Lower chest: 3 mm pulmonary nodule within the right lower lobe, new compared to previous CT of 10/30/2007, this level not imaged on interval abdomen CTs (series 5, image 8). Lung bases otherwise clear. Hepatobiliary: Small layering stones within the otherwise normal-appearing gallbladder. Liver is unremarkable, previously demonstrated hemangioma within the right liver lobe is not clearly seen on today's exam. No bile duct dilatation. Pancreas: Unremarkable. No pancreatic ductal dilatation or surrounding inflammatory changes. Spleen: Normal in size without focal abnormality. Adrenals/Urinary Tract: Adrenal glands are unremarkable bilaterally. Incidental note made of focal thickening within the medial limb of the right adrenal gland. Kidneys are unremarkable without mass, stone or hydronephrosis. No perinephric edema. No ureteral or bladder calculi identified. Bladder appears normal. Stomach/Bowel: No dilated large or small bowel loops. Scattered diverticulosis throughout the colon, most prominent within the sigmoid colon, but no focal inflammatory change to suggest acute diverticulitis. Appendix is normal. Stomach is normal. Vascular/Lymphatic: Aortic atherosclerosis. No enlarged abdominal or pelvic lymph nodes. Reproductive: Uterus and bilateral adnexa are unremarkable. Other: No free fluid or abscess collection. No free intraperitoneal air. Musculoskeletal: Mild degenerative change within the slightly scoliotic thoracolumbar spine. No acute or suspicious osseous finding. Small  periumbilical abdominal wall hernia which contains fat only. Superficial soft tissues otherwise unremarkable. IMPRESSION: 1. No acute findings within the abdomen or pelvis. 2. Colonic diverticulosis without evidence of acute diverticulitis. No bowel obstruction or evidence of bowel wall inflammation. 3. 3 mm pulmonary nodule within the right lower lobe, new compared to a previous CT of 10/30/2007. No follow-up needed if patient is low-risk. Non-contrast chest CT can be considered in 12 months if patient is high-risk. This recommendation follows the consensus statement: Guidelines for Management of Incidental Pulmonary Nodules Detected on CT Images: From the Fleischner Society 2017; Radiology 2017; 284:228-243. 4. Cholelithiasis without evidence of acute cholecystitis. 5. Aortic atherosclerosis. Electronically Signed   By: SFranki CabotM.D.   On: 07/20/2017 11:53    My personal review of EKG: Sinus bradycardia at 53 with PVCs, no ST-T changes.   Assessment & Plan:    Active Problems: Symptomatic anemia Suspect symptoms ongoing for past few months.  Likely secondary to GI bleed with melanotic stool and Hemoccult positive stool. N.p.o.  Started on PPI drip.  GI consulted and plan for EGD this afternoon. Monitor serial H&H.    Essential hypertension History of arrhythmias Resume home dose metoprolol and Cardizem.    Obesity (BMI 30-39.9)  Pulmonary nodule Incidental finding of 3 mm right lower lobe pulmonary nodule.  This is new compared to CT from 2009.  No family history of malignancy and no history of smoking or asbestos exposure.   Can follow-up with noncontrast chest CT in 12 months.    DVT Prophylaxis: SCDs  AM Labs Ordered, also please review Full Orders  Family Communication: Admission, patients condition and plan of care including tests being ordered have been discussed with the patient, her husband and daughter at bedside   Code Status full code  Likely DC to  home  Condition: [place] called: GI  Admission status: Inpatient  Time spent in minutes : 50   Taiwana Willison M.D on 07/20/2017 at 2:28 PM  Between 7am to 7pm - Pager - [phone]. After 7pm go to [email or web address] - password [facility]  [facility] - Office  [phone]

## 2017-07-20 NOTE — Op Note (Addendum)
[facility] Patient Name: Julia Martinez Procedure Date: 07/20/2017 1:58 PM MRN: [id number] Date of Birth: 12-10-1950 Attending MD: Norvel Richards , MD CSN: [id number] Age: 67 Admit Type: Outpatient Procedure:                Upper GI endoscopy Indications:              Melena Providers:                Norvel Richards, MD, Janeece Riggers, RN, Aram Candela Referring MD:              Medicines:                Midazolam 3 mg IV, Meperidine 50 mg IV Complications:            No immediate complications. Estimated Blood Loss:     Estimated blood loss: none. Procedure:                Pre-Anesthesia Assessment:                           - Prior to the procedure, a History and Physical                            was performed, and patient medications and                            allergies were reviewed. The patient's tolerance of                            previous anesthesia was also reviewed. The risks                            and benefits of the procedure and the sedation                            options and risks were discussed with the patient.                            All questions were answered, and informed consent                            was obtained. Prior Anticoagulants: The patient has                            taken no previous anticoagulant or antiplatelet                            agents. ASA Grade Assessment: II - A patient with                            mild systemic disease. After reviewing the risks  and benefits, the patient was deemed in                            satisfactory condition to undergo the procedure.                           After obtaining informed consent, the endoscope was                            passed under direct vision. Throughout the                            procedure, the patient's blood pressure, pulse, and                            oxygen saturations were monitored  continuously. The                            QM-[id number]) scope was introduced through the                            mouth, and advanced to the third part of duodenum.                            The upper GI endoscopy was accomplished without                            difficulty. The patient tolerated the procedure                            well. The upper GI endoscopy was accomplished                            without difficulty. The patient tolerated the                            procedure well. Scope In: 2:26:29 PM Scope Out: 2:35:03 PM Total Procedure Duration: 0 hours 8 minutes 34 seconds  Findings:      The examined esophagus was normal.      A small hiatal hernia was present.      The duodenal bulb, second portion of the duodenum and third portion of       the duodenum were normal aside from a large second portion diverticulum.       No blood seen in the upper GI tract Impression:               - Normal esophagus.                           - Small hiatal hernia.                           - Normal duodenal bulb, second portion of the  duodenum and third portion of the duodenum                            (Duodenal diverticulum).                           - No specimens collected. No blood in the upper GI                            tract. Moderate Sedation:      Moderate (conscious) sedation was administered by the endoscopy nurse       and supervised by the endoscopist. The following parameters were       monitored: oxygen saturation, heart rate, blood pressure, respiratory       rate, EKG, adequacy of pulmonary ventilation, and response to care.       Total physician intraservice time was 14 minutes. Recommendation:           - Return patient to hospital ward for ongoing care.                           - Clear liquid diet.                           - Continue present medications.                           - No repeat upper endoscopy.                            - Return to GI office (date not yet determined).                            Will proceed with a diagnostic colonoscopy tomorrow                            (Dr. Oneida Alar) to further evaluate her as to the                            etiology of the bleeding. Procedure Code(s):        --- Professional ---                           [phone], Esophagogastroduodenoscopy, flexible,                            transoral; diagnostic, including collection of                            specimen(s) by brushing or washing, when performed                            (separate procedure)                           99152, Moderate sedation services provided by the  same physician or other qualified health care                            professional performing the diagnostic or                            therapeutic service that the sedation supports,                            requiring the presence of an independent trained                            observer to assist in the monitoring of the                            patient's level of consciousness and physiological                            status; initial 15 minutes of intraservice time,                            patient age 54 years or older Diagnosis Code(s):        --- Professional ---                           K44.9, Diaphragmatic hernia without obstruction or                            gangrene                           K92.1, Melena (includes Hematochezia) CPT copyright 2016 American Medical Association. All rights reserved. The codes documented in this report are preliminary and upon coder review may  be revised to meet current compliance requirements. Cristopher Estimable. Tru Rana, MD Norvel Richards, MD 07/20/2017 2:41:38 PM This report has been signed electronically. Number of Addenda: 0

## 2017-07-20 NOTE — Consult Note (Signed)
Referring Provider: Dr. Thurnell Garbe  Primary Care Physician:  Celene Squibb, MD Primary Gastroenterologist:  Dr. Gala Romney   Date of Admission: 07/20/17 Date of Consultation: 07/20/17  Reason for Consultation:  Anemia, melena   HPI:  Julia Martinez is a 67 y.o. year old female who presented to the ED this morning after noting black stool yesterday and dark burgundy this morning. Notes she was getting ready to sit down at her desk and felt light-headed. Advised by daughter to present to the ED. On presentation, Hgb 9.7. Unknown baseline, but prior Hgbs in remote past have been normal.   She has never had an upper endoscopy. She denies any NSAIDs, aspirin powders. No PPI. States her reflux has been severe over past few days. Upper abdominal pain noted intermittently for 3 months without exacerbating or relieving factors. No dysphagia. No unexplained weight loss. Notes significant stress lately.   She did have a colonoscopy 02/2008 by Dr. Gala Romney: single external hemorrhoid tag, otherwise normal rectum. Numerous left-sided diverticula, abnormal IC valve and TI suggestive of Crohn's disease s/p biopsy. Biopsies with non-specific ulcerations. CTE then revealing chronic inflammatory change of distal TI, concerning for SB Crohn's. Did not start therapy due to absence of clinical symptoms and recommended [facility] evaluation. Repeat colonoscopy recommended in 2011 by [facility]. She did not complete this and will be due for routine screening as outpatient.    CT today: colonic diverticulosis, 3 mm pulmonary nodule in right lower lobe, new from 2009.   Per ED notes, black stool in rectal vault and heme positive.   Past Medical History:  Diagnosis Date  . Arrhythmia    remote cardiologist  . Depression   . High blood pressure     Past Surgical History:  Procedure Laterality Date  . CESAREAN SECTION    . COLONOSCOPY  2009   single external hemorrhoid tag, otherwise normal rectum. Numerous left-sided diverticula,  abnormal IC valve and TI suggestive of Crohn's disease s/p biopsy. Biopsies with non-specific ulcerations. CTE then revealing chronic inflammatory change of distal TI, concerning for SB Crohn's. Did not start therapy due to absence of clinical symptoms and recommended [facility] evaluation.  . WISDOM TOOTH EXTRACTION      Prior to Admission medications   Medication Sig Start Date End Date Taking? Authorizing Provider  diltiazem (TIAZAC) 360 MG 24 hr capsule Take 360 mg by mouth daily.   Yes [provider]  metoprolol (TOPROL-XL) 200 MG 24 hr tablet Take 200 mg by mouth daily.   Yes [provider]    Current Facility-Administered Medications  Medication Dose Route Frequency Provider Last Rate Last Dose  . pantoprazole (PROTONIX) 80 mg in sodium chloride 0.9 % 250 mL (0.32 mg/mL) infusion  8 mg/hr Intravenous Continuous Francine Graven, DO 25 mL/hr at 07/20/17 1030 8 mg/hr at 07/20/17 1030   Current Outpatient Medications  Medication Sig Dispense Refill  . diltiazem (TIAZAC) 360 MG 24 hr capsule Take 360 mg by mouth daily.    . metoprolol (TOPROL-XL) 200 MG 24 hr tablet Take 200 mg by mouth daily.      Allergies as of 07/20/2017 - Review Complete 07/20/2017  Allergen Reaction Noted  . Penicillins Nausea Only 10/06/2013    Family History  Problem Relation Age of Onset  . Depression Mother   . Ulcers Mother   . Colon cancer Neg Hx   . Colon polyps Neg Hx     Social History   Socioeconomic History  . Marital status: Married  Spouse name: Not on file  . Number of children: Not on file  . Years of education: Not on file  . Highest education level: Not on file  Social Needs  . Financial resource strain: Not on file  . Food insecurity - worry: Not on file  . Food insecurity - inability: Not on file  . Transportation needs - medical: Not on file  . Transportation needs - non-medical: Not on file  Occupational History  . Occupation: part-time for [employer]  Tobacco  Use  . Smoking status: Never Smoker  . Smokeless tobacco: Never Used  Substance and Sexual Activity  . Alcohol use: No  . Drug use: No  . Sexual activity: No  Other Topics Concern  . Not on file  Social History Narrative  . Not on file    Review of Systems: Gen: +chills  CV: Denies chest pain, heart palpitations, syncope, edema  Resp: Denies shortness of breath with rest, cough, wheezing GI: see HPI  GU : Denies urinary burning, urinary frequency, urinary incontinence.  MS: Denies joint pain,swelling, cramping Derm: Denies rash, itching, dry skin Psych: Denies depression, anxiety,confusion, or memory loss Heme: see HPI   Physical Exam: Vital signs in last 24 hours: Temp:  [98.3 F (36.8 C)] 98.3 F (36.8 C) (03/04 0916) Pulse Rate:  [55-58] 58 (03/04 1107) Resp:  [17-20] 17 (03/04 1107) BP: (133-148)/(62) 148/62 (03/04 1107) SpO2:  [96 %-98 %] 96 % (03/04 1107) Weight:  [200 lb (90.7 kg)] 200 lb (90.7 kg) (03/04 0919)   General:   Alert,  Well-developed, well-nourished, pleasant and cooperative in NAD Head:  Normocephalic and atraumatic. Eyes:  Sclera clear, no icterus.   Conjunctiva pink. Ears:  Normal auditory acuity. Nose:  No deformity, discharge,  or lesions. Mouth:  No deformity or lesions, dentition normal. Lungs:  Clear throughout to auscultation.   No wheezes, crackles, or rhonchi. No acute distress. Heart:  S1 S2 present without obvious murmurs  Abdomen:  Soft, nontender and nondistended. Obese. No masses, hepatosplenomegaly or hernias noted. Normal bowel sounds, without guarding, and without rebound.   Rectal:  Deferred  Msk:  Symmetrical without gross deformities. Normal posture. Extremities:  Without  edema. Neurologic:  Alert and  oriented x4 Psych:  Alert and cooperative. Normal mood and affect.  Intake/Output from previous day: No intake/output data recorded. Intake/Output this shift: Total I/O In: 100 [IV Piggyback:100] Out: -   Lab  Results: Recent Labs    07/20/17 0950  WBC 10.2  HGB 9.7*  HCT 32.9*  PLT 361   BMET Recent Labs    07/20/17 0950  NA 137  K 4.5  CL 106  CO2 22  GLUCOSE 198*  BUN 20  CREATININE 0.88  CALCIUM 9.9   LFT Recent Labs    07/20/17 0950  PROT 7.6  ALBUMIN 3.4*  AST 24  ALT 27  ALKPHOS 102  BILITOT 0.2*   PT/INR Recent Labs    07/20/17 0950  LABPROT 13.2  INR 1.01    Studies/Results: Dg Chest 2 View  Result Date: 07/20/2017 CLINICAL DATA:  Abdominal pain for months. Melena. Weakness this morning. EXAM: CHEST  2 VIEW COMPARISON:  11/18/2003 FINDINGS: Cardiac silhouette is mildly enlarged. No mediastinal or hilar masses. Mild linear opacity at the right lung base is consistent with atelectasis. Lungs are otherwise clear. No convincing pleural effusion. No pneumothorax. Skeletal structures are demineralized but grossly intact. IMPRESSION: No acute cardiopulmonary disease. Electronically Signed   By: Dedra Skeens.D.  On: 07/20/2017 11:26   Ct Abdomen Pelvis W Contrast  Result Date: 07/20/2017 CLINICAL DATA:  PT c/o generalized weakness, acid reflux, increased anxiety x1 month. PT c/o dark tarry stools that started yesterday and states she was sitting at her desk this morning and started feeling lightheaded EXAM: CT ABDOMEN AND PELVIS WITH CONTRAST TECHNIQUE: Multidetector CT imaging of the abdomen and pelvis was performed using the standard protocol following bolus administration of intravenous contrast. CONTRAST:  169m ISOVUE-300 IOPAMIDOL (ISOVUE-300) INJECTION 61% COMPARISON:  CT abdomen dated 05/05/2008. FINDINGS: Lower chest: 3 mm pulmonary nodule within the right lower lobe, new compared to previous CT of 10/30/2007, this level not imaged on interval abdomen CTs (series 5, image 8). Lung bases otherwise clear. Hepatobiliary: Small layering stones within the otherwise normal-appearing gallbladder. Liver is unremarkable, previously demonstrated hemangioma within the right  liver lobe is not clearly seen on today's exam. No bile duct dilatation. Pancreas: Unremarkable. No pancreatic ductal dilatation or surrounding inflammatory changes. Spleen: Normal in size without focal abnormality. Adrenals/Urinary Tract: Adrenal glands are unremarkable bilaterally. Incidental note made of focal thickening within the medial limb of the right adrenal gland. Kidneys are unremarkable without mass, stone or hydronephrosis. No perinephric edema. No ureteral or bladder calculi identified. Bladder appears normal. Stomach/Bowel: No dilated large or small bowel loops. Scattered diverticulosis throughout the colon, most prominent within the sigmoid colon, but no focal inflammatory change to suggest acute diverticulitis. Appendix is normal. Stomach is normal. Vascular/Lymphatic: Aortic atherosclerosis. No enlarged abdominal or pelvic lymph nodes. Reproductive: Uterus and bilateral adnexa are unremarkable. Other: No free fluid or abscess collection. No free intraperitoneal air. Musculoskeletal: Mild degenerative change within the slightly scoliotic thoracolumbar spine. No acute or suspicious osseous finding. Small periumbilical abdominal wall hernia which contains fat only. Superficial soft tissues otherwise unremarkable. IMPRESSION: 1. No acute findings within the abdomen or pelvis. 2. Colonic diverticulosis without evidence of acute diverticulitis. No bowel obstruction or evidence of bowel wall inflammation. 3. 3 mm pulmonary nodule within the right lower lobe, new compared to a previous CT of 10/30/2007. No follow-up needed if patient is low-risk. Non-contrast chest CT can be considered in 12 months if patient is high-risk. This recommendation follows the consensus statement: Guidelines for Management of Incidental Pulmonary Nodules Detected on CT Images: From the Fleischner Society 2017; Radiology 2017; 284:228-243. 4. Cholelithiasis without evidence of acute cholecystitis. 5. Aortic atherosclerosis.  Electronically Signed   By: SFranki CabotM.D.   On: 07/20/2017 11:53    Impression: Very pleasant 67year old female presenting with new onset melena, anemia with unknown baseline Hgb, with no prior EGD. Upper GI source suspected. Hemodynamically stable at this time. Denies any NSAIDs, aspirin powders. Dyspepsia noted for past several months. Discussed with patient pursuing diagnostic EGD this afternoon. She has been NPO since yesterday evening after dinner.   Proceed with upper endoscopy today. The risks, benefits, and alternatives have been discussed in detail with patient. They have stated understanding and desire to proceed.   Will need follow-up of pulmonary nodule as outpatient. She will also need outpatient routine colonoscopy after acute issues resolved.    Plan: Remain NPO EGD with Dr. RGala Romneytoday with conscious sedation Protonix infusion started on admission per ED: may be able to change to scheduling dosing pending results of EGD Will need outpatient follow-up of pulmonary nodule and colonoscopy Further recommendations to follow  AAnnitta Needs PhD, ANP-BC [facility]     LOS: 0 days    07/20/2017, 1:51 PM

## 2017-07-20 NOTE — Progress Notes (Signed)
Consent for colonoscopy with Dr. Oneida Alar on 3/5 signed and in chart.

## 2017-07-20 NOTE — ED Notes (Signed)
Pt to CT

## 2017-07-21 ENCOUNTER — Other Ambulatory Visit: Payer: Self-pay

## 2017-07-21 ENCOUNTER — Telehealth: Payer: Self-pay | Admitting: Gastroenterology

## 2017-07-21 ENCOUNTER — Encounter (HOSPITAL_COMMUNITY): Payer: Self-pay | Admitting: Gastroenterology

## 2017-07-21 ENCOUNTER — Encounter (HOSPITAL_COMMUNITY)
Admission: EM | Disposition: A | Discharge status: Home / Self Care | Payer: Self-pay | Source: Home / Self Care | Attending: Internal Medicine

## 2017-07-21 DIAGNOSIS — K5750 Diverticulosis of both small and large intestine without perforation or abscess without bleeding: Secondary | ICD-10-CM

## 2017-07-21 DIAGNOSIS — K922 Gastrointestinal hemorrhage, unspecified: Secondary | ICD-10-CM

## 2017-07-21 HISTORY — PX: COLONOSCOPY: SHX5424

## 2017-07-21 LAB — HEMOGLOBIN AND HEMATOCRIT, BLOOD
HCT: 30.3 % — ABNORMAL LOW (ref 36.0–46.0)
Hemoglobin: 9.1 g/dL — ABNORMAL LOW (ref 12.0–15.0)

## 2017-07-21 LAB — HIV ANTIBODY (ROUTINE TESTING W REFLEX): HIV Screen 4th Generation wRfx: NONREACTIVE

## 2017-07-21 SURGERY — COLONOSCOPY
Anesthesia: Moderate Sedation

## 2017-07-21 MED ORDER — FLUMAZENIL 0.5 MG/5ML IV SOLN
INTRAVENOUS | Status: AC
  Filled 2017-07-21: qty 5

## 2017-07-21 MED ORDER — ATROPINE SULFATE 1 MG/ML IJ SOLN
INTRAMUSCULAR | Status: AC
  Filled 2017-07-21: qty 1

## 2017-07-21 MED ORDER — ATROPINE SULFATE 1 MG/ML IJ SOLN
INTRAMUSCULAR | Status: DC | PRN
  Administered 2017-07-21: .5 mg via INTRAVENOUS

## 2017-07-21 MED ORDER — MIDAZOLAM HCL 5 MG/5ML IJ SOLN
INTRAMUSCULAR | Status: DC | PRN
  Administered 2017-07-21 (×3): 2 mg via INTRAVENOUS

## 2017-07-21 MED ORDER — STERILE WATER FOR IRRIGATION IR SOLN
Status: DC | PRN
  Administered 2017-07-21: 15 mL

## 2017-07-21 MED ORDER — FLUMAZENIL 0.5 MG/5ML IV SOLN
INTRAVENOUS | Status: DC | PRN
  Administered 2017-07-21: .5 mg via INTRAVENOUS

## 2017-07-21 MED ORDER — NALOXONE HCL 0.4 MG/ML IJ SOLN
INTRAMUSCULAR | Status: DC | PRN
  Administered 2017-07-21: 0.4 mg via INTRAVENOUS

## 2017-07-21 MED ORDER — MEPERIDINE HCL 100 MG/ML IJ SOLN
INTRAMUSCULAR | Status: DC | PRN
  Administered 2017-07-21 (×4): 25 mg via INTRAVENOUS

## 2017-07-21 MED ORDER — INFLUENZA VAC SPLIT QUAD 0.5 ML IM SUSY
0.5000 mL | PREFILLED_SYRINGE | Freq: Once | INTRAMUSCULAR | Status: AC
  Administered 2017-07-21: 0.5 mL via INTRAMUSCULAR
  Filled 2017-07-21: qty 0.5

## 2017-07-21 MED ORDER — NALOXONE HCL 0.4 MG/ML IJ SOLN
INTRAMUSCULAR | Status: AC
  Filled 2017-07-21: qty 1

## 2017-07-21 MED ORDER — MEPERIDINE HCL 100 MG/ML IJ SOLN
INTRAMUSCULAR | Status: AC
  Filled 2017-07-21: qty 2

## 2017-07-21 MED ORDER — ONDANSETRON HCL 4 MG/2ML IJ SOLN: INTRAMUSCULAR | Status: DC | PRN

## 2017-07-21 MED ORDER — MIDAZOLAM HCL 5 MG/5ML IJ SOLN
INTRAMUSCULAR | Status: AC
  Filled 2017-07-21: qty 10

## 2017-07-21 NOTE — H&P (Signed)
  Primary Care Physician:  Celene Squibb, MD Primary Gastroenterologist:  Dr. Oneida Alar  Pre-Procedure History & Physical: HPI:  Julia Martinez is a 67 y.o. female here for RECTAL BLEEDING.  Past Medical History:  Diagnosis Date  . Arrhythmia    remote cardiologist  . Depression   . High blood pressure     Past Surgical History:  Procedure Laterality Date  . CESAREAN SECTION    . COLONOSCOPY  2009   single external hemorrhoid tag, otherwise normal rectum. Numerous left-sided diverticula, abnormal IC valve and TI suggestive of Crohn's disease s/p biopsy. Biopsies with non-specific ulcerations. CTE then revealing chronic inflammatory change of distal TI, concerning for SB Crohn's. Did not start therapy due to absence of clinical symptoms and recommended [facility] evaluation.  . WISDOM TOOTH EXTRACTION      Prior to Admission medications   Medication Sig Start Date End Date Taking? Authorizing Provider  diltiazem (TIAZAC) 360 MG 24 hr capsule Take 360 mg by mouth daily.   Yes [provider]  metoprolol (TOPROL-XL) 200 MG 24 hr tablet Take 200 mg by mouth daily.   Yes [provider]    Allergies as of 07/20/2017 - Review Complete 07/20/2017  Allergen Reaction Noted  . Penicillins Nausea Only 10/06/2013    Family History  Problem Relation Age of Onset  . Depression Mother   . Ulcers Mother   . Colon cancer Neg Hx   . Colon polyps Neg Hx     Social History   Socioeconomic History  . Marital status: Married    Spouse name: Not on file  . Number of children: Not on file  . Years of education: Not on file  . Highest education level: Not on file  Social Needs  . Financial resource strain: Not on file  . Food insecurity - worry: Not on file  . Food insecurity - inability: Not on file  . Transportation needs - medical: Not on file  . Transportation needs - non-medical: Not on file  Occupational History  . Occupation: part-time for [employer]  Tobacco Use  . Smoking  status: Never Smoker  . Smokeless tobacco: Never Used  Substance and Sexual Activity  . Alcohol use: No  . Drug use: No  . Sexual activity: No  Other Topics Concern  . Not on file  Social History Narrative  . Not on file    Review of Systems: See HPI, otherwise negative ROS   Physical Exam: BP 132/70   Pulse 66   Temp 98.5 F (36.9 C) (Oral)   Resp 17   Ht 5' 4"  (1.626 m)   Wt 200 lb (90.7 kg)   SpO2 92%   BMI 34.33 kg/m  General:   Alert,  pleasant and cooperative in NAD Head:  Normocephalic and atraumatic. Neck:  Supple; Lungs:  Clear throughout to auscultation.    Heart:  Regular rate and rhythm. Abdomen:  Soft, nontender and nondistended. Normal bowel sounds, without guarding, and without rebound.   Neurologic:  Alert and  oriented x4;  grossly normal neurologically.  Impression/Plan:     RECTAL BLEEDING  PLAN:  1. TCS TODAY DISCUSSED PROCEDURE, BENEFITS, & RISKS: < 1% chance of medication reaction, bleeding, perforation, or rupture of spleen/liver.

## 2017-07-21 NOTE — Discharge Summary (Signed)
Physician Discharge Summary  RETINA BERNARDY OVF:[id number] DOB: January 21, 1951 DOA: 07/20/2017  PCP: Celene Squibb, MD  Admit date: 07/20/2017 Discharge date: 07/21/2017  Admitted From: Home Disposition:  Home  Recommendations for Outpatient Follow-up:  1. Follow up with PCP in 1 week.  Please monitor H&H. 2. Patient will be scheduled for outpatient capsule study. 3. Recommend noncontrast chest CT in 12 months to evaluate incidental pulmonary nodule seen on CT scan this admission.  Home Health: None Equipment/Devices: None  Discharge Condition: Home CODE STATUS: Full code Diet recommendation: Regular    Discharge Diagnoses:  Principal Problem:   GI bleed  Active Problems:   Symptomatic anemia   Essential hypertension   Obesity (BMI 30-39.9)  Brief narrative/HPI  67 y.o. female, with history of arrhythmias, hypertension and obesity presented to the ED with generalized weakness and off and on burning epigastric pain with reflux symptoms for past 3-4 months.  Patient reports being under a lot of personal stress for the past 2 months.  Yesterday she noticed one episode of dark stools and then this morning another episode of stool that were burgundy in color.  Reports having a few episodes of watery diarrhea last week.  Denies any hematemesis, headache, fevers, chills, nausea, vomiting, abdominal cramping, dysuria.  She does report some dizziness and occasional lightheadedness.  Denies any change in her appetite, weight loss, recent sick contact, illness or fever.  Denies use of NSAIDs, aspirin or Goody powder.  Denies smoking or alcohol use. She reports having a normal colonoscopy several years back.  She reports having a history of diverticulosis with diverticulitis.  She reports that her last blood work was done about a year ago and thinks her hemoglobin was normal.  In the ED vitals were stable except for mildly elevated blood pressure.  Blood work showed hemoglobin of 9.7 (last labs in the  system 7 years back with hemoglobin of about 14).  Normal platelets.  Normal electrolytes.  Stool for Hemoccult was positive. Chest x-ray was unremarkable while a CT of the abdomen and pelvis was negative for acute abdominal findings, showed colonic diverticulosis without diverticulitis.  Also showed cholelithiasis without acute cholecystitis.  EKG showed sinus bradycardia at 53 with PVCs.   Hospital course Symptomatic anemia Suspect symptoms have been ongoing for the past few months.  GI consult appreciated.  Underwent EGD and colonoscopy both without any source of bleeding.  H&H has not dropped further and patient feels better with no further abdominal discomfort. GI plan on outpatient capsule study.  (Will arrange for follow-up). -Patient instructed clearly on avoiding any type of NSAIDs and also informed on any alarming symptoms and signs of further GI bleed and to contact her PCP or return to the ED    Essential hypertension History of arrhythmias Resume home dose metoprolol and Cardizem.    Obesity (BMI 30-39.9) Counseled on weight loss and exercise  Pulmonary nodule Incidental finding of 3 mm right lower lobe pulmonary nodule.  This is new compared to CT from 2009.  No family history of malignancy and no history of smoking or asbestos exposure.   Can follow-up with noncontrast chest CT in 12 months.   Consult: GI Procedure: EGD and colonoscopy, CT abdomen  Family communication: Husband at bedside.    Discharge Instructions   Allergies as of 07/21/2017      Reactions   Penicillins Nausea Only      Medication List    TAKE these medications   diltiazem 360 MG 24  hr capsule Commonly known as:  TIAZAC Take 360 mg by mouth daily.   metoprolol 200 MG 24 hr tablet Commonly known as:  TOPROL-XL Take 200 mg by mouth daily.      Follow-up Information    Celene Squibb, MD. Schedule an appointment as soon as possible for a visit in 1 week(s).   Specialty:  Internal  Medicine Contact information: [address] [place]ella Reichert [facility] [address] [phone]        Daneil Dolin, MD Follow up in 1 week(s).   Specialty:  Gastroenterology Why:  office will arrange appt Contact information: [place] [address] [phone]          Allergies  Allergen Reactions  . Penicillins Nausea Only      Procedures/Studies: Dg Chest 2 View  Result Date: 07/20/2017 CLINICAL DATA:  Abdominal pain for months. Melena. Weakness this morning. EXAM: CHEST  2 VIEW COMPARISON:  11/18/2003 FINDINGS: Cardiac silhouette is mildly enlarged. No mediastinal or hilar masses. Mild linear opacity at the right lung base is consistent with atelectasis. Lungs are otherwise clear. No convincing pleural effusion. No pneumothorax. Skeletal structures are demineralized but grossly intact. IMPRESSION: No acute cardiopulmonary disease. Electronically Signed   By: Lajean Manes M.D.   On: 07/20/2017 11:26   Ct Abdomen Pelvis W Contrast  Result Date: 07/20/2017 CLINICAL DATA:  PT c/o generalized weakness, acid reflux, increased anxiety x1 month. PT c/o dark tarry stools that started yesterday and states she was sitting at her desk this morning and started feeling lightheaded EXAM: CT ABDOMEN AND PELVIS WITH CONTRAST TECHNIQUE: Multidetector CT imaging of the abdomen and pelvis was performed using the standard protocol following bolus administration of intravenous contrast. CONTRAST:  123m ISOVUE-300 IOPAMIDOL (ISOVUE-300) INJECTION 61% COMPARISON:  CT abdomen dated 05/05/2008. FINDINGS: Lower chest: 3 mm pulmonary nodule within the right lower lobe, new compared to previous CT of 10/30/2007, this level not imaged on interval abdomen CTs (series 5, image 8). Lung bases otherwise clear. Hepatobiliary: Small layering stones within the otherwise normal-appearing gallbladder. Liver is unremarkable, previously demonstrated hemangioma within the right liver lobe is not clearly seen  on today's exam. No bile duct dilatation. Pancreas: Unremarkable. No pancreatic ductal dilatation or surrounding inflammatory changes. Spleen: Normal in size without focal abnormality. Adrenals/Urinary Tract: Adrenal glands are unremarkable bilaterally. Incidental note made of focal thickening within the medial limb of the right adrenal gland. Kidneys are unremarkable without mass, stone or hydronephrosis. No perinephric edema. No ureteral or bladder calculi identified. Bladder appears normal. Stomach/Bowel: No dilated large or small bowel loops. Scattered diverticulosis throughout the colon, most prominent within the sigmoid colon, but no focal inflammatory change to suggest acute diverticulitis. Appendix is normal. Stomach is normal. Vascular/Lymphatic: Aortic atherosclerosis. No enlarged abdominal or pelvic lymph nodes. Reproductive: Uterus and bilateral adnexa are unremarkable. Other: No free fluid or abscess collection. No free intraperitoneal air. Musculoskeletal: Mild degenerative change within the slightly scoliotic thoracolumbar spine. No acute or suspicious osseous finding. Small periumbilical abdominal wall hernia which contains fat only. Superficial soft tissues otherwise unremarkable. IMPRESSION: 1. No acute findings within the abdomen or pelvis. 2. Colonic diverticulosis without evidence of acute diverticulitis. No bowel obstruction or evidence of bowel wall inflammation. 3. 3 mm pulmonary nodule within the right lower lobe, new compared to a previous CT of 10/30/2007. No follow-up needed if patient is low-risk. Non-contrast chest CT can be considered in 12 months if patient is high-risk. This recommendation follows the consensus statement:  Guidelines for Management of Incidental Pulmonary Nodules Detected on CT Images: From the Fleischner Society 2017; Radiology 2017; 284:228-243. 4. Cholelithiasis without evidence of acute cholecystitis. 5. Aortic atherosclerosis. Electronically Signed   By: Franki Cabot M.D.   On: 07/20/2017 11:53     EGD Normal esophagus, small hiatal hernia  Colonoscopy Multiple ileal diverticula, moderate diverticulosis of entire colon, small external hemorrhoid.  Subjective: Denies further abdominal discomfort or fatigue.  No overnight events.  Discharge Exam: Vitals:   07/21/17 1600 07/21/17 1605  BP: 100/69 138/83  Pulse: (!) 56 60  Resp: 19 (!) 22  Temp:    SpO2: (!) 89% 90%   Vitals:   07/21/17 1550 07/21/17 1555 07/21/17 1600 07/21/17 1605  BP: 132/82 (!) 120/53 100/69 138/83  Pulse: (!) 57 (!) 58 (!) 56 60  Resp: 17 16 19  (!) 22  Temp:      TempSrc:      SpO2: 93% 90% (!) 89% 90%  Weight:      Height:        General: Not in distress HEENT: Pallor present, moist mucosa, supple neck Chest: Clear bilaterally CVS: Normal S1 and S2, no murmurs GI: Soft, nondistended, nontender Musculoskeletal: Warm, no edema    The results of significant diagnostics from this hospitalization (including imaging, microbiology, ancillary and laboratory) are listed below for reference.     Microbiology: No results found for this or any previous visit (from the past 240 hour(s)).   Labs: BNP (last 3 results) No results for input(s): BNP in the last 8760 hours. Basic Metabolic Panel: Recent Labs  Lab 07/20/17 0950  NA 137  K 4.5  CL 106  CO2 22  GLUCOSE 198*  BUN 20  CREATININE 0.88  CALCIUM 9.9   Liver Function Tests: Recent Labs  Lab 07/20/17 0950  AST 24  ALT 27  ALKPHOS 102  BILITOT 0.2*  PROT 7.6  ALBUMIN 3.4*   No results for input(s): LIPASE, AMYLASE in the last 168 hours. No results for input(s): AMMONIA in the last 168 hours. CBC: Recent Labs  Lab 07/20/17 0950 07/20/17 1536 07/20/17 2025 07/21/17 0253  WBC 10.2  --   --   --   NEUTROABS 8.6*  --   --   --   HGB 9.7* 9.3* 9.5* 9.1*  HCT 32.9* 30.9* 32.1* 30.3*  MCV 82.9  --   --   --   PLT 361  --   --   --    Cardiac Enzymes: Recent Labs  Lab  07/20/17 0950  TROPONINI <0.03   BNP: Invalid input(s): POCBNP CBG: No results for input(s): GLUCAP in the last 168 hours. D-Dimer No results for input(s): DDIMER in the last 72 hours. Hgb A1c No results for input(s): HGBA1C in the last 72 hours. Lipid Profile No results for input(s): CHOL, HDL, LDLCALC, TRIG, CHOLHDL, LDLDIRECT in the last 72 hours. Thyroid function studies No results for input(s): TSH, T4TOTAL, T3FREE, THYROIDAB in the last 72 hours.  Invalid input(s): FREET3 Anemia work up No results for input(s): VITAMINB12, FOLATE, FERRITIN, TIBC, IRON, RETICCTPCT in the last 72 hours. Urinalysis    Component Value Date/Time   COLORURINE STRAW (A) 07/20/2017 1243   APPEARANCEUR CLEAR 07/20/2017 1243   LABSPEC 1.035 (H) 07/20/2017 1243   PHURINE 6.0 07/20/2017 [place] 07/20/2017 1243   HGBUR NEGATIVE 07/20/2017 [place] NEGATIVE 07/20/2017 1243   KETONESUR NEGATIVE 07/20/2017 1243   PROTEINUR NEGATIVE 07/20/2017 1243  NITRITE NEGATIVE 07/20/2017 1243   LEUKOCYTESUR NEGATIVE 07/20/2017 1243   Sepsis Labs Invalid input(s): PROCALCITONIN,  WBC,  LACTICIDVEN Microbiology No results found for this or any previous visit (from the past 240 hour(s)).   Time coordinating discharge: <30 minutes  SIGNED:   Louellen Molder, MD  [facility] 07/21/2017, 4:28 PM Pager   If 7PM-7AM, please contact night-coverage [email or web address] Password [email or web address]

## 2017-07-21 NOTE — Op Note (Signed)
[facility] Patient Name: Julia Martinez Procedure Date: 07/21/2017 12:14 PM MRN: [id number] Date of Birth: [birth year] Attending MD: Barney Drain MD, MD CSN: [id number] Age: 67 Admit Type: Inpatient Procedure:                Colonoscopy, DIAGNOSTIC Indications:              Melena Providers:                Barney Drain MD, MD, Charlsie Quest. Theda Sers RN, RN,                            Nelma Rothman, Technician Referring MD:             Edwinna Areola. [place] Crane MD Medicines:                Meperidine 100 mg IV, Midazolam 6 mg IV, Atropine                            0.5 mg IV Complications:            No immediate complications. Estimated Blood Loss:     Estimated blood loss: none. Procedure:                Pre-Anesthesia Assessment:                           - Prior to the procedure, a History and Physical                            was performed, and patient medications and                            allergies were reviewed. The patient's tolerance of                            previous anesthesia was also reviewed. The risks                            and benefits of the procedure and the sedation                            options and risks were discussed with the patient.                            All questions were answered, and informed consent                            was obtained. Prior Anticoagulants: The patient has                            taken no previous anticoagulant or antiplatelet                            agents. ASA Grade Assessment: II - A patient with  mild systemic disease. After reviewing the risks                            and benefits, the patient was deemed in                            satisfactory condition to undergo the procedure.                            After obtaining informed consent, the colonoscope                            was passed under direct vision. Throughout the                            procedure, the patient's blood  pressure, pulse, and                            oxygen saturations were monitored continuously. The                            EC-[id number] ([id number]) scope was introduced through                            the anus and advanced to the 5 cm into the ileum.                            The terminal ileum, ileocecal valve, appendiceal                            orifice, and rectum were photographed. The                            colonoscopy was technically difficult and complex                            due to significant looping. Successful completion                            of the procedure was aided by using manual                            pressure, straightening and shortening the scope to                            obtain bowel loop reduction and COLOWRAP. The                            patient tolerated the procedure fairly well. The                            quality of the bowel preparation was good. Scope In: 1:41:14 PM Scope Out: 2:04:48 PM Scope Withdrawal Time: 0 hours 10 minutes 56 seconds  Total Procedure Duration: 0 hours 23 minutes 34 seconds  Findings:      The distal ileum contained multiple small diverticula.      Multiple small and large-mouthed diverticula were found in the entire       colon.      The recto-sigmoid colon, sigmoid colon and descending colon revealed       moderately excessive looping.      External hemorrhoids were found during retroflexion. The hemorrhoids       were small. Impression:               - MULTIPLE Ileal diverticula.                           - MODERATE Diverticulosis in the entire examined                            colon.                           - There was significant looping of the LEFT colon.                           - SMALL External hemorrhoids.                           - NO SOURCE FOR ANEMIA/GI BLEED IDENTIFIED Moderate Sedation:      Moderate (conscious) sedation was administered by the endoscopy nurse       and supervised  by the endoscopist. The following parameters were       monitored: oxygen saturation, heart rate, blood pressure, and response       to care. Total physician intraservice time was 35 minutes. Recommendation:           - Repeat colonoscopy in 10 years for surveillance.                           - High fiber diet.                           - Continue present medications. NEEDS AGILE CAPSULE.                           - Return to GI office in 4 months.                           - Return patient to hospital ward for ongoing care. Procedure Code(s):        --- Professional ---                           [phone], Colonoscopy, flexible; diagnostic, including                            collection of specimen(s) by brushing or washing,                            when performed (separate procedure) Diagnosis Code(s):        --- Professional ---  K64.4, Residual hemorrhoidal skin tags                           K92.1, Melena (includes Hematochezia)                           K57.50, Diverticulosis of both small and large                            intestine without perforation or abscess without                            bleeding CPT copyright 2016 American Medical Association. All rights reserved. The codes documented in this report are preliminary and upon coder review may  be revised to meet current compliance requirements. Barney Drain, MD Barney Drain MD, MD 07/21/2017 2:15:40 PM This report has been signed electronically. Number of Addenda: 0

## 2017-07-21 NOTE — Progress Notes (Signed)
Pt IV removed, WNL. D/C instructions given to pt. Verbalized understanding. Pt husband at bedside to transport home.

## 2017-07-21 NOTE — Discharge Instructions (Signed)
Gastrointestinal Bleeding Gastrointestinal bleeding is bleeding somewhere along the path food travels through the body (digestive tract). This path is anywhere between the mouth and the opening of the butt (anus). You may have blood in your poop (stools) or have black poop. If you throw up (vomit), there may be blood in it. This condition can be mild, serious, or even life-threatening. If you have a lot of bleeding, you may need to stay in the hospital. Follow these instructions at home:  Take over-the-counter and prescription medicines only as told by your doctor.  Eat foods that have a lot of fiber in them. These foods include whole grains, fruits, and vegetables. You can also try eating 1-3 prunes each day.  Drink enough fluid to keep your pee (urine) clear or pale yellow.  Keep all follow-up visits as told by your doctor. This is important. Contact a doctor if:  Your symptoms do not get better. Get help right away if:  Your bleeding gets worse.  You feel dizzy or you pass out (faint).  You feel weak.  You have very bad cramps in your back or belly (abdomen).  You pass large clumps of blood (clots) in your poop.  Your symptoms are getting worse. This information is not intended to replace advice given to you by your health care provider. Make sure you discuss any questions you have with your health care provider. Document Released: 02/12/2008 Document Revised: 10/11/2015 Document Reviewed: 10/23/2014 Elsevier Interactive Patient Education  2018 [employer].

## 2017-07-21 NOTE — Progress Notes (Addendum)
Tap water enema X2 given. PT tolerated well. 8-10BM throughout the night per PT. BM now clear after 2 enemas. PT prepped and educated about procedure.

## 2017-07-21 NOTE — Telephone Encounter (Signed)
PT NEEDS AGILE CAPSULE STUDY, Dx: ANEMIA, NORMOCYTIC, ORDERING MD: Gala Romney.

## 2017-07-21 NOTE — Progress Notes (Signed)
Tolerated prep. Now clear. Understands risks and benefits. Per discussion with Dr. Gala Romney yesterday, if colonoscopy is unrevealing, will need patency capsule followed by [facility] as appropriate. Capsule could be done as outpatient.

## 2017-07-22 ENCOUNTER — Other Ambulatory Visit: Payer: Self-pay

## 2017-07-22 DIAGNOSIS — D649 Anemia, unspecified: Secondary | ICD-10-CM

## 2017-07-22 NOTE — Telephone Encounter (Signed)
Called pt. Agile capsule study scheduled for 08/05/17 at 7:30am, pt to arrive at 7:00am. Instructions mailed. Orders entered. DG abd 1 view order entered to be done the next day after Agile.

## 2017-07-24 NOTE — Telephone Encounter (Signed)
Pt called office to reschedule Agile d/t she will start receiving Medicare next month. Agile rescheduled to 08/26/17 at 8:30, arrive at 8:00am. Endo scheduler aware.

## 2017-08-04 ENCOUNTER — Encounter (HOSPITAL_COMMUNITY): Payer: Self-pay | Admitting: Internal Medicine

## 2017-08-25 NOTE — Telephone Encounter (Addendum)
Patient called stating she twisted her ankle and she has an appointment scheduled for in the AM. She wants to call back and get this r/s'd when she feels better. Called [place] and made aware.

## 2017-08-26 ENCOUNTER — Ambulatory Visit (HOSPITAL_COMMUNITY): Admission: RE | Admit: 2017-08-26 | Payer: Medicare Other | Source: Ambulatory Visit | Admitting: Internal Medicine

## 2017-08-26 ENCOUNTER — Encounter (HOSPITAL_COMMUNITY): Admission: RE | Payer: Self-pay | Source: Ambulatory Visit

## 2017-08-26 DIAGNOSIS — M545 Low back pain: Secondary | ICD-10-CM | POA: Diagnosis not present

## 2017-08-26 DIAGNOSIS — D509 Iron deficiency anemia, unspecified: Secondary | ICD-10-CM | POA: Diagnosis not present

## 2017-08-26 DIAGNOSIS — I10 Essential (primary) hypertension: Secondary | ICD-10-CM | POA: Diagnosis not present

## 2017-08-26 DIAGNOSIS — R001 Bradycardia, unspecified: Secondary | ICD-10-CM | POA: Diagnosis not present

## 2017-08-26 SURGERY — AGILE CAPSULE

## 2017-09-15 ENCOUNTER — Encounter: Payer: Self-pay | Admitting: Cardiology

## 2017-09-15 NOTE — Progress Notes (Signed)
Cardiology Office Note  Date: 09/17/2017   ID: RANDE DARIO, DOB [birth year], MRN [id number]  PCP: Celene Squibb, MD  Consulting cardiologist: Rozann Lesches, MD   Chief Complaint  Patient presents with  . Hypertension    History of Present Illness: Julia Martinez is a 67 y.o. female referred for cardiology consultation by Dr. Nevada Crane for the assessment of hypertension and bradycardia.  We discussed her history.  She reports a long-standing history of PSVT with good control on fairly high-dose Cardizem CD and Toprol-XL.  He states that she was evaluated by cardiologist many years ago.  She has had to be treated with adenosine in the past.  She was hospitalized in March of this year with GI bleed.  She underwent EGD and colonoscopy without active bleeding source and it was recommended that she eventually get a capsule endoscopy.  My understanding is that she had some episodes of bradycardia noted during the Martinez stay and it was ultimately recommended that she cut back doses of both Toprol-XL and Cardizem CD.  She reports being under a considerable amount of personal stress, has a special needs son at home that she and her husband are struggling to manage.  She reports that her blood pressure has been up as well.  She does not report palpitations but just feels anxious most of the time.  No chest pain or syncope.  I reviewed her recent ECG which is outlined below.  I reviewed her medications which are outlined below.  She was given a prescription for HCTZ at PCP office, but admits that she has not been taking this regularly as yet.  Past Medical History:  Diagnosis Date  . Depression   . Essential hypertension   . PSVT (paroxysmal supraventricular tachycardia) ([place])     Past Surgical History:  Procedure Laterality Date  . CESAREAN SECTION    . COLONOSCOPY  2009   single external hemorrhoid tag, otherwise normal rectum. Numerous left-sided diverticula, abnormal IC valve and TI  suggestive of Crohn's disease s/p biopsy. Biopsies with non-specific ulcerations. CTE then revealing chronic inflammatory change of distal TI, concerning for SB Crohn's. Did not start therapy due to absence of clinical symptoms and recommended [facility] evaluation.  . COLONOSCOPY N/A 07/21/2017   Procedure: COLONOSCOPY;  Surgeon: Danie Binder, MD;  [facility];  Service: Endoscopy;  Laterality: N/A;  . ESOPHAGOGASTRODUODENOSCOPY N/A 07/20/2017   Procedure: ESOPHAGOGASTRODUODENOSCOPY (EGD);  Surgeon: Daneil Dolin, MD;  [facility]: [facility];  Service: Endoscopy;  Laterality: N/A;  . WISDOM TOOTH EXTRACTION      Current Outpatient Medications  Medication Sig Dispense Refill  . diltiazem (DILACOR XR) 240 MG 24 hr capsule Take 240 mg by mouth daily.    . ferrous sulfate 325 (65 FE) MG EC tablet Take 325 mg by mouth daily.    . hydrochlorothiazide (HYDRODIURIL) 25 MG tablet Take 25 mg by mouth daily.    . metoprolol succinate (TOPROL-XL) 100 MG 24 hr tablet Take 100 mg by mouth daily. Take with or immediately following a meal.    . metoprolol succinate (TOPROL-XL) 50 MG 24 hr tablet Take with or immediately following a meal.take 50 mg daily by mouth IN ADDITION to the 100 mg you already take for a total dose of 150 mg daily 90 tablet 3   No current facility-administered medications for this visit.    Allergies:  Penicillins   Social History: The patient  reports that she has never smoked.  She has never used smokeless tobacco. She reports that she does not drink alcohol or use drugs.   Family History: The patient's family history includes Depression in her mother; Ulcers in her mother.   ROS:  Please see the history of present illness. Otherwise, complete review of systems is positive for anxiety.  All other systems are reviewed and negative.   Physical Exam: VS:  BP (!) 190/80 (BP Location: Right Arm)   Pulse 80   Ht 5' 4"  (1.626 m)   Wt 211 lb (95.7 kg)   SpO2 95%   BMI  36.22 kg/m , BMI Body mass index is 36.22 kg/m.  Wt Readings from Last 3 Encounters:  09/17/17 211 lb (95.7 kg)  07/20/17 200 lb (90.7 kg)    General: Patient appears comfortable at rest. HEENT: Conjunctiva and lids normal, oropharynx clear. Neck: Supple, no elevated JVP or carotid bruits, no thyromegaly. Lungs: Clear to auscultation, nonlabored breathing at rest. Cardiac: Regular rate and rhythm, no S3, 4-6/9 basal systolic murmur. Abdomen: Soft, nontender, bowel sounds present. Extremities: No pitting edema, distal pulses 2+. Skin: Warm and dry. Musculoskeletal: No kyphosis. Neuropsychiatric: Alert and oriented x3, affect grossly appropriate.  ECG: I personally reviewed the tracing from 07/21/2017 which showed sinus bradycardia with right bundle branch block, PVC, and nonspecific ST changes.  Recent Labwork: 07/20/2017: ALT 27; AST 24; BUN 20; Creatinine, Ser 0.88; Platelets 361; Potassium 4.5; Sodium 137 07/21/2017: Hemoglobin 9.1   Other Studies Reviewed Today:  Chest x-ray 07/20/2017: FINDINGS: Cardiac silhouette is mildly enlarged. No mediastinal or hilar masses.  Mild linear opacity at the right lung base is consistent with atelectasis. Lungs are otherwise clear. No convincing pleural effusion. No pneumothorax.  Skeletal structures are demineralized but grossly intact.  IMPRESSION: No acute cardiopulmonary disease.  Assessment and Plan:  1.  Essential hypertension, currently not well controlled.  Recommended to her that she go ahead and take HCTZ on a regular basis as prescribed by PCP.  Also increase Toprol-XL to 150 mg daily which is still less than the previous so she was taking.  2.  History of PSVT.  Continue combination of Cardizem CD and Toprol-XL as this has provided good control of years.  Resting heart rate is in the 80s today.  Observe for symptomatic bradycardia.  3.  Heart murmur, echocardiogram will be obtained.  She is not aware of any prior documented  history of valvular heart disease.  4.  Psychosocial stress.  Current medicines were reviewed with the patient today.   Orders Placed This Encounter  Procedures  . ECHOCARDIOGRAM COMPLETE    Disposition: Follow-up in 6 months.  Signed, Satira Sark, MD, [facility] 09/17/2017 3:11 PM    Julia [facility] [address] [place], [place] [address] Phone: [phone]; Fax: [phone]

## 2017-09-17 ENCOUNTER — Ambulatory Visit (INDEPENDENT_AMBULATORY_CARE_PROVIDER_SITE_OTHER): Payer: Medicare Other | Admitting: Cardiology

## 2017-09-17 ENCOUNTER — Encounter: Payer: Self-pay | Admitting: Cardiology

## 2017-09-17 VITALS — BP 190/80 | HR 80 | Ht 64.0 in | Wt 211.0 lb

## 2017-09-17 DIAGNOSIS — R011 Cardiac murmur, unspecified: Secondary | ICD-10-CM

## 2017-09-17 DIAGNOSIS — I471 Supraventricular tachycardia: Secondary | ICD-10-CM | POA: Diagnosis not present

## 2017-09-17 DIAGNOSIS — Z658 Other specified problems related to psychosocial circumstances: Secondary | ICD-10-CM | POA: Diagnosis not present

## 2017-09-17 DIAGNOSIS — I10 Essential (primary) hypertension: Secondary | ICD-10-CM | POA: Diagnosis not present

## 2017-09-17 MED ORDER — METOPROLOL SUCCINATE ER 50 MG PO TB24: ORAL_TABLET | ORAL | 3 refills | Status: DC

## 2017-09-17 NOTE — Patient Instructions (Addendum)
Your physician wants you to follow-up in:6 months  With Dr.McDowell You will receive a reminder letter in the mail two months in advance. If you don't receive a letter, please call our office to schedule the follow-up appointment.      INCREASE Toprol to 150 mg daily ( you will take 100 mg, plus 50 mg tablet)     Your physician has requested that you have an echocardiogram. Echocardiography is a painless test that uses sound waves to create images of your heart. It provides your doctor with information about the size and shape of your heart and how well your heart's chambers and valves are working. This procedure takes approximately one hour. There are no restrictions for this procedure.     No lab work today       Thank you for [place] !

## 2017-09-17 NOTE — Addendum Note (Signed)
Addended by: Barbarann Ehlers A on: 09/17/2017 03:20 PM   Modules accepted: Orders

## 2017-09-24 ENCOUNTER — Other Ambulatory Visit (HOSPITAL_COMMUNITY): Payer: Self-pay

## 2017-10-01 ENCOUNTER — Ambulatory Visit (HOSPITAL_COMMUNITY)
Admission: RE | Admit: 2017-10-01 | Discharge: 2017-10-01 | Disposition: A | Discharge status: Home / Self Care | Payer: Medicare Other | Source: Ambulatory Visit | Attending: Cardiology | Admitting: Cardiology

## 2017-10-01 DIAGNOSIS — Z6836 Body mass index (BMI) 36.0-36.9, adult: Secondary | ICD-10-CM | POA: Insufficient documentation

## 2017-10-01 DIAGNOSIS — R011 Cardiac murmur, unspecified: Secondary | ICD-10-CM | POA: Insufficient documentation

## 2017-10-01 DIAGNOSIS — I081 Rheumatic disorders of both mitral and tricuspid valves: Secondary | ICD-10-CM | POA: Diagnosis not present

## 2017-10-01 DIAGNOSIS — E669 Obesity, unspecified: Secondary | ICD-10-CM | POA: Diagnosis not present

## 2017-10-01 DIAGNOSIS — I10 Essential (primary) hypertension: Secondary | ICD-10-CM | POA: Insufficient documentation

## 2017-10-01 NOTE — Progress Notes (Signed)
*  PRELIMINARY RESULTS* Echocardiogram 2D Echocardiogram has been performed.  Samuel Germany 10/01/2017, 1:31 PM

## 2017-10-05 ENCOUNTER — Telehealth: Payer: Self-pay

## 2017-10-05 DIAGNOSIS — R931 Abnormal findings on diagnostic imaging of heart and coronary circulation: Secondary | ICD-10-CM

## 2017-10-05 NOTE — Telephone Encounter (Signed)
-----   Message from Satira Sark, MD sent at 10/05/2017  4:38 PM EDT ----- Regarding: RE: sleep study Yes, we can go ahead and refer her for a sleep study with Dr. Luan Pulling so that he can manage CPAP thereafter if needed.  ----- Message ----- From: Bernita Raisin, RN Sent: 10/05/2017   4:33 PM To: Satira Sark, MD Subject: sleep study                                    Pt would like sleep study, states Dr Oneida Alar had a hard time waking her up after colonoscopy and mentioned to pt she needed a sleep study.With your approval, I will place order.She wants it done her at [facility] as she has special needs son and must stay close to home.

## 2017-10-05 NOTE — Telephone Encounter (Signed)
Ref placed to Dr Luan Pulling, pt aware

## 2017-10-07 ENCOUNTER — Telehealth: Payer: Self-pay | Admitting: *Deleted

## 2017-10-07 NOTE — Telephone Encounter (Signed)
LMOVM

## 2017-10-07 NOTE — Telephone Encounter (Signed)
Received called from patient to r/s her agile capsule study

## 2017-10-08 NOTE — Telephone Encounter (Signed)
LMOVM. Will mail letter to call

## 2017-11-30 DIAGNOSIS — I10 Essential (primary) hypertension: Secondary | ICD-10-CM | POA: Diagnosis not present

## 2017-11-30 DIAGNOSIS — J301 Allergic rhinitis due to pollen: Secondary | ICD-10-CM | POA: Diagnosis not present

## 2017-11-30 DIAGNOSIS — R0683 Snoring: Secondary | ICD-10-CM | POA: Diagnosis not present

## 2017-11-30 DIAGNOSIS — R931 Abnormal findings on diagnostic imaging of heart and coronary circulation: Secondary | ICD-10-CM | POA: Diagnosis not present

## 2017-12-29 DIAGNOSIS — I10 Essential (primary) hypertension: Secondary | ICD-10-CM | POA: Diagnosis not present

## 2017-12-29 DIAGNOSIS — F419 Anxiety disorder, unspecified: Secondary | ICD-10-CM | POA: Diagnosis not present

## 2017-12-29 DIAGNOSIS — B3789 Other sites of candidiasis: Secondary | ICD-10-CM | POA: Diagnosis not present

## 2017-12-29 DIAGNOSIS — D508 Other iron deficiency anemias: Secondary | ICD-10-CM | POA: Diagnosis not present

## 2017-12-29 DIAGNOSIS — Z0001 Encounter for general adult medical examination with abnormal findings: Secondary | ICD-10-CM | POA: Diagnosis not present

## 2017-12-29 DIAGNOSIS — Z6841 Body Mass Index (BMI) 40.0 and over, adult: Secondary | ICD-10-CM | POA: Diagnosis not present

## 2018-01-20 ENCOUNTER — Other Ambulatory Visit (HOSPITAL_BASED_OUTPATIENT_CLINIC_OR_DEPARTMENT_OTHER): Payer: Self-pay

## 2018-01-20 DIAGNOSIS — R5383 Other fatigue: Secondary | ICD-10-CM

## 2018-01-20 DIAGNOSIS — I10 Essential (primary) hypertension: Secondary | ICD-10-CM | POA: Diagnosis not present

## 2018-01-20 DIAGNOSIS — E782 Mixed hyperlipidemia: Secondary | ICD-10-CM | POA: Diagnosis not present

## 2018-01-20 DIAGNOSIS — M545 Low back pain: Secondary | ICD-10-CM | POA: Diagnosis not present

## 2018-01-20 DIAGNOSIS — D509 Iron deficiency anemia, unspecified: Secondary | ICD-10-CM | POA: Diagnosis not present

## 2018-01-20 DIAGNOSIS — R0683 Snoring: Secondary | ICD-10-CM

## 2018-01-20 DIAGNOSIS — R001 Bradycardia, unspecified: Secondary | ICD-10-CM | POA: Diagnosis not present

## 2018-01-26 ENCOUNTER — Ambulatory Visit: Payer: Medicare Other | Attending: Pulmonary Disease | Admitting: Neurology

## 2018-01-26 DIAGNOSIS — R5383 Other fatigue: Secondary | ICD-10-CM | POA: Diagnosis present

## 2018-01-26 DIAGNOSIS — R0683 Snoring: Secondary | ICD-10-CM

## 2018-01-26 DIAGNOSIS — G4733 Obstructive sleep apnea (adult) (pediatric): Secondary | ICD-10-CM | POA: Insufficient documentation

## 2018-01-28 NOTE — Procedures (Signed)
   [place] A. Merlene Laughter, MD     [email or web address]             NOCTURNAL POLYSOMNOGRAPHY   LOCATION: [facility]  Patient Name: Julia Martinez, Julia Martinez Date: 01/26/2018 Gender: Female D.O.B: 03-21-1951 Age (years): 70 Referring Provider: Sinda Du Height (inches): 64 Interpreting Physician: Phillips Odor MD, ABSM Weight (lbs): 210 RPSGT: Rosebud Poles BMI: 36 MRN: [id number] Neck Size: 14.50 <br> <br> CLINICAL INFORMATION Sleep Study Type: NPSG    Indication for sleep study: Fatigue, Snoring    Epworth Sleepiness Score: 6    SLEEP STUDY TECHNIQUE As per the AASM Manual for the Scoring of Sleep and Associated Events v2.3 (April 2016) with a hypopnea requiring 4% desaturations.  The channels recorded and monitored were frontal, central and occipital EEG, electrooculogram (EOG), submentalis EMG (chin), nasal and oral airflow, thoracic and abdominal wall motion, anterior tibialis EMG, snore microphone, electrocardiogram, and pulse oximetry.  MEDICATIONS Medications self-administered by patient taken the night of the study : N/A  Current Outpatient Medications:  .  diltiazem (DILACOR XR) 240 MG 24 hr capsule, Take 240 mg by mouth daily., Disp: , Rfl:  .  ferrous sulfate 325 (65 FE) MG EC tablet, Take 325 mg by mouth daily., Disp: , Rfl:  .  hydrochlorothiazide (HYDRODIURIL) 25 MG tablet, Take 25 mg by mouth daily., Disp: , Rfl:  .  metoprolol succinate (TOPROL-XL) 100 MG 24 hr tablet, Take 100 mg by mouth daily. Take with or immediately following a meal., Disp: , Rfl:  .  metoprolol succinate (TOPROL-XL) 50 MG 24 hr tablet, Take 50 mg tablet with the 100 mg tablet for a total of 150 mg daily, Disp: 90 tablet, Rfl: 3    SLEEP ARCHITECTURE The study was initiated at 10:10:16 PM and ended at 4:37:11 AM.  Sleep onset time was 98.7 minutes and the sleep efficiency was 15.1%%. The total sleep time was 58.5 minutes.  Stage REM latency was N/A  minutes.  The patient spent 46.2%% of the night in stage N1 sleep, 47.0%% in stage N2 sleep, 6.8%% in stage N3 and 0% in REM.  Alpha intrusion was absent.  Supine sleep was 0.00%.  RESPIRATORY PARAMETERS The overall apnea/hypopnea index (AHI) was 8.2 per hour. There were 0 total apneas, including 0 obstructive, 0 central and 0 mixed apneas. There were 8 hypopneas and 14 RERAs.  The AHI during Stage REM sleep was N/A per hour.  AHI while supine was N/A per hour.  The mean oxygen saturation was 94.2%. The minimum SpO2 during sleep was 91.0%.  moderate snoring was noted during this study.  CARDIAC DATA The 2 lead EKG demonstrated sinus rhythm. The mean heart rate was 59.1 beats per minute. Other EKG findings include: PVCs. LEG MOVEMENT DATA The total PLMS were 0 with a resulting PLMS index of 0.0. Associated arousal with leg movement index was 0.0.  IMPRESSIONS - Mild obstructive sleep apnea is documented with this recording. The severity does not require positive pressure treatment. - Very poor sleep efficiency is observed. This likely results underestimation of the patient's apnea. Repeat home sleep test is recommended.   Delano Metz, MD Diplomate, American Board of Sleep Medicine. ELECTRONICALLY SIGNED ON:  01/28/2018, 5:45 PM [place] PH: [phone]   FX: [phone] [place]

## 2018-04-28 DIAGNOSIS — I10 Essential (primary) hypertension: Secondary | ICD-10-CM | POA: Diagnosis not present

## 2018-04-28 DIAGNOSIS — F411 Generalized anxiety disorder: Secondary | ICD-10-CM | POA: Diagnosis not present

## 2018-06-21 DIAGNOSIS — J019 Acute sinusitis, unspecified: Secondary | ICD-10-CM | POA: Diagnosis not present

## 2018-07-22 DIAGNOSIS — D509 Iron deficiency anemia, unspecified: Secondary | ICD-10-CM | POA: Diagnosis not present

## 2018-07-22 DIAGNOSIS — I10 Essential (primary) hypertension: Secondary | ICD-10-CM | POA: Diagnosis not present

## 2018-07-22 DIAGNOSIS — D508 Other iron deficiency anemias: Secondary | ICD-10-CM | POA: Diagnosis not present

## 2018-07-30 DIAGNOSIS — N182 Chronic kidney disease, stage 2 (mild): Secondary | ICD-10-CM | POA: Diagnosis not present

## 2018-07-30 DIAGNOSIS — I129 Hypertensive chronic kidney disease with stage 1 through stage 4 chronic kidney disease, or unspecified chronic kidney disease: Secondary | ICD-10-CM | POA: Diagnosis not present

## 2018-07-30 DIAGNOSIS — E782 Mixed hyperlipidemia: Secondary | ICD-10-CM | POA: Diagnosis not present

## 2018-07-30 DIAGNOSIS — D509 Iron deficiency anemia, unspecified: Secondary | ICD-10-CM | POA: Diagnosis not present

## 2018-07-30 DIAGNOSIS — F411 Generalized anxiety disorder: Secondary | ICD-10-CM | POA: Diagnosis not present

## 2018-07-30 DIAGNOSIS — R7301 Impaired fasting glucose: Secondary | ICD-10-CM | POA: Diagnosis not present

## 2018-07-30 DIAGNOSIS — I479 Paroxysmal tachycardia, unspecified: Secondary | ICD-10-CM | POA: Diagnosis not present

## 2018-08-11 ENCOUNTER — Encounter (HOSPITAL_COMMUNITY): Payer: Medicare Other

## 2018-08-25 ENCOUNTER — Encounter (HOSPITAL_COMMUNITY): Payer: Medicare Other

## 2018-08-30 ENCOUNTER — Ambulatory Visit: Payer: Self-pay | Admitting: Cardiology

## 2018-09-22 ENCOUNTER — Ambulatory Visit (HOSPITAL_COMMUNITY): Payer: Self-pay

## 2018-10-13 ENCOUNTER — Encounter (HOSPITAL_COMMUNITY)
Admission: RE | Admit: 2018-10-13 | Discharge: 2018-10-13 | Disposition: A | Discharge status: Home / Self Care | Payer: Medicare Other | Source: Ambulatory Visit | Attending: Internal Medicine | Admitting: Internal Medicine

## 2018-10-13 ENCOUNTER — Encounter (HOSPITAL_COMMUNITY): Payer: Medicare Other

## 2018-10-13 ENCOUNTER — Other Ambulatory Visit: Payer: Self-pay

## 2018-10-13 DIAGNOSIS — D509 Iron deficiency anemia, unspecified: Secondary | ICD-10-CM | POA: Diagnosis not present

## 2018-10-13 MED ORDER — SODIUM CHLORIDE 0.9 % IV SOLN
INTRAVENOUS | Status: DC
  Administered 2018-10-13: 09:00:00 via INTRAVENOUS

## 2018-10-13 MED ORDER — SODIUM CHLORIDE 0.9 % IV SOLN
750.0000 mg | Freq: Once | INTRAVENOUS | Status: AC
  Administered 2018-10-13: 750 mg via INTRAVENOUS
  Filled 2018-10-13: qty 15

## 2018-10-20 ENCOUNTER — Encounter (HOSPITAL_COMMUNITY)
Admission: RE | Admit: 2018-10-20 | Discharge: 2018-10-20 | Disposition: A | Discharge status: Home / Self Care | Payer: Medicare Other | Source: Ambulatory Visit | Attending: Internal Medicine | Admitting: Internal Medicine

## 2018-10-20 ENCOUNTER — Other Ambulatory Visit: Payer: Self-pay

## 2018-10-20 ENCOUNTER — Encounter (HOSPITAL_COMMUNITY): Payer: Self-pay

## 2018-10-20 DIAGNOSIS — D509 Iron deficiency anemia, unspecified: Secondary | ICD-10-CM | POA: Insufficient documentation

## 2018-10-20 MED ORDER — SODIUM CHLORIDE 0.9 % IV SOLN
Freq: Once | INTRAVENOUS | Status: AC
  Administered 2018-10-20: 09:00:00 via INTRAVENOUS

## 2018-10-20 MED ORDER — SODIUM CHLORIDE 0.9 % IV SOLN
750.0000 mg | Freq: Once | INTRAVENOUS | Status: AC
  Administered 2018-10-20: 750 mg via INTRAVENOUS
  Filled 2018-10-20: qty 15

## 2018-10-27 DIAGNOSIS — I129 Hypertensive chronic kidney disease with stage 1 through stage 4 chronic kidney disease, or unspecified chronic kidney disease: Secondary | ICD-10-CM | POA: Diagnosis not present

## 2018-10-27 DIAGNOSIS — B3789 Other sites of candidiasis: Secondary | ICD-10-CM | POA: Diagnosis not present

## 2018-10-27 DIAGNOSIS — D508 Other iron deficiency anemias: Secondary | ICD-10-CM | POA: Diagnosis not present

## 2018-10-27 DIAGNOSIS — R001 Bradycardia, unspecified: Secondary | ICD-10-CM | POA: Diagnosis not present

## 2018-10-27 DIAGNOSIS — F419 Anxiety disorder, unspecified: Secondary | ICD-10-CM | POA: Diagnosis not present

## 2018-10-27 DIAGNOSIS — Z6841 Body Mass Index (BMI) 40.0 and over, adult: Secondary | ICD-10-CM | POA: Diagnosis not present

## 2018-10-27 DIAGNOSIS — I10 Essential (primary) hypertension: Secondary | ICD-10-CM | POA: Diagnosis not present

## 2018-10-27 DIAGNOSIS — M545 Low back pain: Secondary | ICD-10-CM | POA: Diagnosis not present

## 2018-10-27 DIAGNOSIS — D509 Iron deficiency anemia, unspecified: Secondary | ICD-10-CM | POA: Diagnosis not present

## 2018-10-27 DIAGNOSIS — E782 Mixed hyperlipidemia: Secondary | ICD-10-CM | POA: Diagnosis not present

## 2018-10-27 DIAGNOSIS — I479 Paroxysmal tachycardia, unspecified: Secondary | ICD-10-CM | POA: Diagnosis not present

## 2018-11-11 ENCOUNTER — Other Ambulatory Visit: Payer: Self-pay | Admitting: Cardiology

## 2019-03-03 DIAGNOSIS — Z23 Encounter for immunization: Secondary | ICD-10-CM | POA: Diagnosis not present

## 2019-05-11 ENCOUNTER — Other Ambulatory Visit: Payer: Self-pay | Admitting: Cardiology

## 2019-06-02 NOTE — Progress Notes (Deleted)
{Choose 1 Note Type (Telehealth Visit or Telephone Visit):[phone]}   Date:  06/02/2019   ID:  Julia Martinez, DOB 03/16/1951, MRN [id number]  {Patient Location:[phone]::"Home"} {Provider [phone]::"Home"}  PCP:  Celene Squibb, MD  Cardiologist:  Rozann Lesches, MD Electrophysiologist:  None   Evaluation Performed:  {Choose Visit VHQI:[id number]::"[id number] Visit"}  Chief Complaint:  ***  History of Present Illness:    Julia Martinez is a 69 y.o. female last seen in May 2019.  She did undergo sleep testing with Dr. Merlene Laughter back in September 2019.  Initial assessment suggested mild OSA.  The patient {does/does not:200015} have symptoms concerning for COVID-19 infection (fever, chills, cough, or new shortness of breath).    Past Medical History:  Diagnosis Date  . Depression   . Essential hypertension   . PSVT (paroxysmal supraventricular tachycardia) ([place])    Past Surgical History:  Procedure Laterality Date  . CESAREAN SECTION    . COLONOSCOPY  2009   single external hemorrhoid tag, otherwise normal rectum. Numerous left-sided diverticula, abnormal IC valve and TI suggestive of Crohn's disease s/p biopsy. Biopsies with non-specific ulcerations. CTE then revealing chronic inflammatory change of distal TI, concerning for SB Crohn's. Did not start therapy due to absence of clinical symptoms and recommended [facility] evaluation.  . COLONOSCOPY N/A 07/21/2017   Procedure: COLONOSCOPY;  Surgeon: Danie Binder, MD;  [facility];  Service: Endoscopy;  Laterality: N/A;  . ESOPHAGOGASTRODUODENOSCOPY N/A 07/20/2017   Procedure: ESOPHAGOGASTRODUODENOSCOPY (EGD);  Surgeon: Daneil Dolin, MD;  [facility];  Service: Endoscopy;  Laterality: N/A;  . WISDOM TOOTH EXTRACTION       No outpatient medications have been marked as taking for the 06/03/19 encounter (Appointment) with Satira Sark, MD.     Allergies:   Penicillins   Social History    Tobacco Use  . Smoking status: Never Smoker  . Smokeless tobacco: Never Used  Substance Use Topics  . Alcohol use: No  . Drug use: No     Family Hx: The patient's family history includes Depression in her mother; Ulcers in her mother. There is no history of Colon cancer or Colon polyps.  ROS:   Please see the history of present illness.    *** All other systems reviewed and are negative.   Prior CV studies:   The following studies were reviewed today:  Echocardiogram 10/01/2017: Study Conclusions  - Left ventricle: The cavity size was normal. Wall thickness was   normal. Systolic function was normal. The estimated ejection   fraction was in the range of 60% to 65%. Wall motion was normal;   there were no regional wall motion abnormalities. Doppler   parameters are consistent with abnormal left ventricular   relaxation (grade 1 diastolic dysfunction). Doppler parameters   are consistent with high ventricular filling pressure. - Aortic valve: Mildly calcified annulus. Trileaflet; mildly   thickened leaflets. Valve area (VTI): 1.81 cm^2. Valve area   (Vmax): 1.53 cm^2. Valve area (Vmean): 1.54 cm^2. - Mitral valve: Mildly calcified annulus. Normal thickness leaflets   . There was mild regurgitation. - Left atrium: The atrium was mildly dilated. - Right ventricle: The cavity size was mildly to moderately   dilated. - Right atrium: The atrium was mildly dilated. - Atrial septum: No defect or patent foramen ovale was identified. - Pulmonary arteries: Systolic pressure was moderately increased.   PA peak pressure: 50 mm Hg (S). - Technically adequate study.  Labs/Other Tests and Data Reviewed:  EKG:  An ECG dated 07/21/2017 was personally reviewed today and demonstrated:  Sinus bradycardia with right bundle branch block, PVC, nonspecific ST changes.  Recent Labs:  March 2019: Hemoglobin 10.1, platelets 355  Wt Readings from Last 3 Encounters:  09/17/17 211 lb (95.7  kg)  07/20/17 200 lb (90.7 kg)     Objective:    Vital Signs:  There were no vitals taken for this visit.   {[facility] Virtual Exam (Optional):[phone]::"VITAL SIGNS:  reviewed"}  ASSESSMENT & PLAN:    1. ***  COVID-19 Education: The signs and symptoms of COVID-19 were discussed with the patient and how to seek care for testing (follow up with PCP or arrange E-visit).  ***The importance of social distancing was discussed today.  Time:   Today, I have spent *** minutes with the patient with telehealth technology discussing the above problems.     Medication Adjustments/Labs and Tests Ordered: Current medicines are reviewed at length with the patient today.  Concerns regarding medicines are outlined above.   Tests Ordered: No orders of the defined types were placed in this encounter.   Medication Changes: No orders of the defined types were placed in this encounter.   Follow Up:  {F/U [phone]} {follow up:15908}  Signed, Rozann Lesches, MD  06/02/2019 11:28 AM    [facility]

## 2019-06-03 ENCOUNTER — Telehealth: Payer: Medicare Other | Admitting: Cardiology

## 2019-06-08 ENCOUNTER — Telehealth: Payer: Medicare Other | Admitting: Cardiology

## 2019-06-08 ENCOUNTER — Ambulatory Visit: Payer: Medicare Other | Attending: Internal Medicine

## 2019-06-08 DIAGNOSIS — Z23 Encounter for immunization: Secondary | ICD-10-CM

## 2019-06-08 NOTE — Progress Notes (Signed)
   Covid-19 Vaccination Clinic  Name:  VALYNN SCHAMBERGER    MRN: [id number] DOB: 1951/05/11  06/08/2019  Ms. Staggs was observed post Covid-19 immunization for 15 minutes without incidence. She was provided with Vaccine Information Sheet and instruction to access the V-Safe system.   Ms. Hohler was instructed to call 911 with any severe reactions post vaccine: Marland Kitchen Difficulty breathing  . Swelling of your face and throat  . A fast heartbeat  . A bad rash all over your body  . Dizziness and weakness    Immunizations Administered    Name Date Dose VIS Date Route   Pfizer COVID-19 Vaccine 06/08/2019  1:48 PM 0.3 mL 04/29/2019 Intramuscular   Manufacturer: [place]   Lot: [id number]   [place]: [id number]

## 2019-06-14 DIAGNOSIS — H25013 Cortical age-related cataract, bilateral: Secondary | ICD-10-CM | POA: Diagnosis not present

## 2019-06-14 DIAGNOSIS — H35433 Paving stone degeneration of retina, bilateral: Secondary | ICD-10-CM | POA: Diagnosis not present

## 2019-06-14 DIAGNOSIS — H2513 Age-related nuclear cataract, bilateral: Secondary | ICD-10-CM | POA: Diagnosis not present

## 2019-06-14 DIAGNOSIS — H25043 Posterior subcapsular polar age-related cataract, bilateral: Secondary | ICD-10-CM | POA: Diagnosis not present

## 2019-06-23 ENCOUNTER — Encounter: Payer: Self-pay | Admitting: Cardiology

## 2019-06-23 NOTE — Progress Notes (Signed)
Virtual Visit via Telephone Note   This visit type was conducted due to national recommendations for restrictions regarding the COVID-19 Pandemic (e.g. social distancing) in an effort to limit this patient's exposure and mitigate transmission in our community.  Due to her co-morbid illnesses, this patient is at least at moderate risk for complications without adequate follow up.  This format is felt to be most appropriate for this patient at this time.  The patient did not have access to video technology/had technical difficulties with video requiring transitioning to audio format only (telephone).  All issues noted in this document were discussed and addressed.  No physical exam could be performed with this format.  Please refer to the patient's chart for her  consent to telehealth for [facility].   Date:  06/24/2019   ID:  Julia Martinez, DOB Jul 20, 1950, MRN [id number]  Patient Location: Home Provider Location: Home  PCP:  Celene Squibb, MD  Cardiologist:  Rozann Lesches, MD Electrophysiologist:  None   Evaluation Performed:  Follow-Up Visit  Chief Complaint:  Cardiac follow-up  History of Present Illness:    Julia Martinez is a 69 y.o. female seen in consultation back in May 2019.  She is overdue for follow-up.  We spoke by phone today.  She tells me that she has been doing reasonably well, no progressive sense of palpitations, and actually her heart rate at rest has been well controlled on current regimen.  She has been focused on losing weight, has modify her diet, and despite being home largely during the pandemic, has been successful.  She did undergo a sleep study with Dr. Merlene Laughter back in September 2019 this demonstrated mild OSA, not suggestive of requirement for CPAP at that time.  I reviewed her current medications which are listed below, she reports no intolerances.  She tells me she is due for a physical with her PCP soon.  The patient does not have symptoms concerning  for COVID-19 infection (fever, chills, cough, or new shortness of breath).  She states that she and her husband have both had the first vaccine dose.   Past Medical History:  Diagnosis Date  . Depression   . Essential hypertension   . OSA (obstructive sleep apnea)    Mild by sleep study 2019  . PSVT (paroxysmal supraventricular tachycardia) ([place])    Past Surgical History:  Procedure Laterality Date  . CESAREAN SECTION    . COLONOSCOPY  2009   single external hemorrhoid tag, otherwise normal rectum. Numerous left-sided diverticula, abnormal IC valve and TI suggestive of Crohn's disease s/p biopsy. Biopsies with non-specific ulcerations. CTE then revealing chronic inflammatory change of distal TI, concerning for SB Crohn's. Did not start therapy due to absence of clinical symptoms and recommended [facility] evaluation.  . COLONOSCOPY N/A 07/21/2017   Procedure: COLONOSCOPY;  Surgeon: Danie Binder, MD;  [facility];  Service: Endoscopy;  Laterality: N/A;  . ESOPHAGOGASTRODUODENOSCOPY N/A 07/20/2017   Procedure: ESOPHAGOGASTRODUODENOSCOPY (EGD);  Surgeon: Daneil Dolin, MD;  Location: [facility];  Service: Endoscopy;  Laterality: N/A;  . WISDOM TOOTH EXTRACTION       Current Meds  Medication Sig  . diltiazem (DILACOR XR) 240 MG 24 hr capsule Take 1 capsule (240 mg total) by mouth daily.  . ferrous sulfate 325 (65 FE) MG EC tablet Take 325 mg by mouth daily.  . hydrochlorothiazide (HYDRODIURIL) 25 MG tablet Take 1 tablet (25 mg total) by mouth daily.  . metoprolol succinate (  TOPROL-XL) 100 MG 24 hr tablet Take 1 tablet (100 mg total) by mouth daily. Take with or immediately following a meal.  . metoprolol succinate (TOPROL-XL) 50 MG 24 hr tablet TAKE ONE TABLET (50MG) BY MOUTH ALONG WITH METOPROLOL 100MG FOR A TOTAL OF 150MG ONCE DAILY  . [DISCONTINUED] diltiazem (DILACOR XR) 240 MG 24 hr capsule Take 240 mg by mouth daily.  . [DISCONTINUED] hydrochlorothiazide (HYDRODIURIL) 25  MG tablet Take 25 mg by mouth daily.  . [DISCONTINUED] metoprolol succinate (TOPROL-XL) 100 MG 24 hr tablet Take 100 mg by mouth daily. Take with or immediately following a meal.  . [DISCONTINUED] metoprolol succinate (TOPROL-XL) 50 MG 24 hr tablet TAKE ONE TABLET (50MG) BY MOUTH ALONG WITH METOPROLOL 100MG FOR A TOTAL OF 150MG ONCE DAILY     Allergies:   Penicillins   Social History   Tobacco Use  . Smoking status: Never Smoker  . Smokeless tobacco: Never Used  Substance Use Topics  . Alcohol use: No  . Drug use: No     Family Hx: The patient's family history includes Depression in her mother; Ulcers in her mother. There is no history of Colon cancer or Colon polyps.  ROS:   No progressive palpitations or syncope.  No chest pain.  Prior CV studies:   The following studies were reviewed today:  Echocardiogram 10/01/2017: - Left ventricle: The cavity size was normal. Wall thickness was  normal. Systolic function was normal. The estimated ejection  fraction was in the range of 60% to 65%. Wall motion was normal;  there were no regional wall motion abnormalities. Doppler  parameters are consistent with abnormal left ventricular  relaxation (grade 1 diastolic dysfunction). Doppler parameters  are consistent with high ventricular filling pressure.  - Aortic valve: Mildly calcified annulus. Trileaflet; mildly  thickened leaflets. Valve area (VTI): 1.81 cm^2. Valve area  (Vmax): 1.53 cm^2. Valve area (Vmean): 1.54 cm^2.  - Mitral valve: Mildly calcified annulus. Normal thickness leaflets  . There was mild regurgitation.  - Left atrium: The atrium was mildly dilated.  - Right ventricle: The cavity size was mildly to moderately  dilated.  - Right atrium: The atrium was mildly dilated.  - Atrial septum: No defect or patent foramen ovale was identified.  - Pulmonary arteries: Systolic pressure was moderately increased.  PA peak pressure: 50 mm Hg (S).  -  Technically adequate study.   Labs/Other Tests and Data Reviewed:    EKG:  An ECG dated 07/20/2017 was personally reviewed today and demonstrated:  Sinus bradycardia with PVC, right bundle branch block, diffuse nonspecific ST-T wave abnormalities.  Recent Labs:  March 2019: Hemoglobin 10.1, platelets 355, ALT 27, AST 24, BUN 20, creatinine 0.88, platelets 361, potassium 4.6, sodium 137  Wt Readings from Last 3 Encounters:  06/24/19 180 lb (81.6 kg)  09/17/17 211 lb (95.7 kg)  07/20/17 200 lb (90.7 kg)     Objective:    Vital Signs:  Pulse 65   Ht 5' 4"  (1.626 m)   Wt 180 lb (81.6 kg)   SpO2 97%   BMI 30.90 kg/m    Patient spoke in full sentences, not short of breath. No audible wheezing or coughing. Speech pattern normal.  ASSESSMENT & PLAN:    1.  PSVT.  She is doing well without progressive palpitations and resting heart rate is well controlled on combination of Cardizem CD and Toprol-XL.  Refills provided.  We will continue with current regimen and observation with follow-up ECG for next office  visit.  2.  Essential hypertension, she was not able to check blood pressure today, we are arranging to have a blood pressure cuff sent to her.  She will continue HCTZ in addition to the above medications and keep pending follow-up with PCP for physical.   Time:   Today, I have spent 5 minutes with the patient with telehealth technology discussing the above problems.     Medication Adjustments/Labs and Tests Ordered: Current medicines are reviewed at length with the patient today.  Concerns regarding medicines are outlined above.   Tests Ordered: No orders of the defined types were placed in this encounter.   Medication Changes: Meds ordered this encounter  Medications  . diltiazem (DILACOR XR) 240 MG 24 hr capsule    Sig: Take 1 capsule (240 mg total) by mouth daily.    Dispense:  90 capsule    Refill:  3  . hydrochlorothiazide (HYDRODIURIL) 25 MG tablet    Sig: Take 1  tablet (25 mg total) by mouth daily.    Dispense:  90 tablet    Refill:  3  . metoprolol succinate (TOPROL-XL) 100 MG 24 hr tablet    Sig: Take 1 tablet (100 mg total) by mouth daily. Take with or immediately following a meal.    Dispense:  90 tablet    Refill:  3  . metoprolol succinate (TOPROL-XL) 50 MG 24 hr tablet    Sig: TAKE ONE TABLET (50MG) BY MOUTH ALONG WITH METOPROLOL 100MG FOR A TOTAL OF 150MG ONCE DAILY    Dispense:  90 tablet    Refill:  3    Follow Up:  In Person 1 year in the [place] office.  Signed, Rozann Lesches, MD  06/24/2019 8:35 AM    [place]

## 2019-06-24 ENCOUNTER — Telehealth: Payer: Self-pay | Admitting: Licensed Clinical Social Worker

## 2019-06-24 ENCOUNTER — Telehealth (INDEPENDENT_AMBULATORY_CARE_PROVIDER_SITE_OTHER): Payer: Medicare Other | Admitting: Cardiology

## 2019-06-24 ENCOUNTER — Encounter: Payer: Self-pay | Admitting: Cardiology

## 2019-06-24 VITALS — HR 65 | Ht 64.0 in | Wt 180.0 lb

## 2019-06-24 DIAGNOSIS — I471 Supraventricular tachycardia: Secondary | ICD-10-CM | POA: Diagnosis not present

## 2019-06-24 DIAGNOSIS — I10 Essential (primary) hypertension: Secondary | ICD-10-CM | POA: Diagnosis not present

## 2019-06-24 MED ORDER — HYDROCHLOROTHIAZIDE 25 MG PO TABS: 25.0000 mg | ORAL_TABLET | Freq: Every day | ORAL | 3 refills | Status: DC

## 2019-06-24 MED ORDER — DILTIAZEM HCL ER 240 MG PO CP24: 240.0000 mg | ORAL_CAPSULE | Freq: Every day | ORAL | 3 refills | Status: DC

## 2019-06-24 MED ORDER — METOPROLOL SUCCINATE ER 50 MG PO TB24: ORAL_TABLET | ORAL | 3 refills | Status: DC

## 2019-06-24 MED ORDER — METOPROLOL SUCCINATE ER 100 MG PO TB24: 100.0000 mg | ORAL_TABLET | Freq: Every day | ORAL | 3 refills | Status: DC

## 2019-06-24 NOTE — Patient Instructions (Signed)
Medication Instructions:  Your physician recommends that you continue on your current medications as directed. Please refer to the Current Medication list given to you today.  *If you need a refill on your cardiac medications before your next appointment, please call your pharmacy*  Lab Work: None today If you have labs (blood work) drawn today and your tests are completely normal, you will receive your results only by: Marland Kitchen MyChart Message (if you have MyChart) OR . A paper copy in the mail If you have any lab test that is abnormal or we need to change your treatment, we will call you to review the results.  Testing/Procedures: None today  Follow-Up: At [facility], you and your health needs are our priority.  As part of our continuing mission to provide you with exceptional heart care, we have created designated Provider Care Teams.  These Care Teams include your primary Cardiologist (physician) and Advanced Practice Providers (APPs -  Physician Assistants and Nurse Practitioners) who all work together to provide you with the care you need, when you need it.  Your next appointment:   12 month(s)  The format for your next appointment:   In Person  Provider:   Rozann Lesches, MD  Other Instructions None     Thank you for choosing [facility] !

## 2019-06-24 NOTE — Telephone Encounter (Signed)
CSW referred to assist patient with obtaining a BP cuff. CSW contacted patient to inform cuff will be delivered to home. Patient grateful for support and assistance. CSW available as needed. Jackie Seferino Oscar, LCSW, CCSW-MCS [phone]  

## 2019-06-28 ENCOUNTER — Ambulatory Visit: Payer: Medicare Other | Attending: Internal Medicine

## 2019-06-28 DIAGNOSIS — Z23 Encounter for immunization: Secondary | ICD-10-CM

## 2019-06-28 NOTE — Progress Notes (Signed)
   Covid-19 Vaccination Clinic  Name:  Julia Martinez    MRN: [id number] DOB: 09-08-1950  06/28/2019  Julia Martinez was observed post Covid-19 immunization for 15 minutes without incidence. She was provided with Vaccine Information Sheet and instruction to access the V-Safe system.   Julia Martinez was instructed to call 911 with any severe reactions post vaccine: Marland Kitchen Difficulty breathing  . Swelling of your face and throat  . A fast heartbeat  . A bad rash all over your body  . Dizziness and weakness    Immunizations Administered    Name Date Dose VIS Date Route   Pfizer COVID-19 Vaccine 06/28/2019  8:41 AM 0.3 mL 04/29/2019 Intramuscular   Manufacturer: [place]   Lot: [id number]   [place]: [address]-6

## 2019-08-12 DIAGNOSIS — H9201 Otalgia, right ear: Secondary | ICD-10-CM | POA: Diagnosis not present

## 2019-08-30 ENCOUNTER — Encounter (HOSPITAL_COMMUNITY)
Admission: EM | Disposition: A | Discharge status: Home / Self Care | Payer: Self-pay | Source: Home / Self Care | Attending: Emergency Medicine

## 2019-08-30 ENCOUNTER — Observation Stay (HOSPITAL_COMMUNITY)
Admission: EM | Admit: 2019-08-30 | Discharge: 2019-08-31 | Disposition: A | Discharge status: Home / Self Care | Payer: Medicare Other | Attending: Family Medicine | Admitting: Family Medicine

## 2019-08-30 ENCOUNTER — Other Ambulatory Visit: Payer: Self-pay

## 2019-08-30 ENCOUNTER — Encounter (HOSPITAL_COMMUNITY): Payer: Self-pay

## 2019-08-30 DIAGNOSIS — K922 Gastrointestinal hemorrhage, unspecified: Secondary | ICD-10-CM | POA: Diagnosis not present

## 2019-08-30 DIAGNOSIS — D500 Iron deficiency anemia secondary to blood loss (chronic): Secondary | ICD-10-CM | POA: Diagnosis present

## 2019-08-30 DIAGNOSIS — D62 Acute posthemorrhagic anemia: Secondary | ICD-10-CM | POA: Diagnosis not present

## 2019-08-30 DIAGNOSIS — Z20822 Contact with and (suspected) exposure to covid-19: Secondary | ICD-10-CM | POA: Insufficient documentation

## 2019-08-30 DIAGNOSIS — R531 Weakness: Secondary | ICD-10-CM

## 2019-08-30 DIAGNOSIS — I10 Essential (primary) hypertension: Secondary | ICD-10-CM | POA: Diagnosis not present

## 2019-08-30 DIAGNOSIS — D649 Anemia, unspecified: Secondary | ICD-10-CM | POA: Insufficient documentation

## 2019-08-30 HISTORY — PX: GIVENS CAPSULE STUDY: SHX5432

## 2019-08-30 LAB — CBC
HCT: 26.9 % — ABNORMAL LOW (ref 36.0–46.0)
Hemoglobin: 7.6 g/dL — ABNORMAL LOW (ref 12.0–15.0)
MCH: 23.2 pg — ABNORMAL LOW (ref 26.0–34.0)
MCHC: 28.3 g/dL — ABNORMAL LOW (ref 30.0–36.0)
MCV: 82 fL (ref 80.0–100.0)
Platelets: 283 10*3/uL (ref 150–400)
RBC: 3.28 MIL/uL — ABNORMAL LOW (ref 3.87–5.11)
RDW: 15.3 % (ref 11.5–15.5)
WBC: 6.5 10*3/uL (ref 4.0–10.5)
nRBC: 0 % (ref 0.0–0.2)

## 2019-08-30 LAB — IRON AND TIBC
Iron: 7 ug/dL — ABNORMAL LOW (ref 28–170)
Saturation Ratios: 2 % — ABNORMAL LOW (ref 10.4–31.8)
TIBC: 419 ug/dL (ref 250–450)
UIBC: 412 ug/dL

## 2019-08-30 LAB — BASIC METABOLIC PANEL
Anion gap: 5 (ref 5–15)
BUN: 20 mg/dL (ref 8–23)
CO2: 24 mmol/L (ref 22–32)
Calcium: 9.3 mg/dL (ref 8.9–10.3)
Chloride: 108 mmol/L (ref 98–111)
Creatinine, Ser: 1.02 mg/dL — ABNORMAL HIGH (ref 0.44–1.00)
GFR calc Af Amer: >60 mL/min (ref >60)
GFR calc non Af Amer: 57 mL/min — ABNORMAL LOW (ref >60)
Glucose, Bld: 168 mg/dL — ABNORMAL HIGH (ref 70–99)
Potassium: 4 mmol/L (ref 3.5–5.1)
Sodium: 137 mmol/L (ref 135–145)

## 2019-08-30 LAB — URINALYSIS, ROUTINE W REFLEX MICROSCOPIC
Bilirubin Urine: NEGATIVE
Glucose, UA: NEGATIVE mg/dL
Ketones, ur: NEGATIVE mg/dL
Leukocytes,Ua: NEGATIVE
Nitrite: NEGATIVE
Protein, ur: NEGATIVE mg/dL
Specific Gravity, Urine: 1.009 (ref 1.005–1.030)
pH: 7 (ref 5.0–8.0)

## 2019-08-30 LAB — FERRITIN: Ferritin: 2 ng/mL — ABNORMAL LOW (ref 11–307)

## 2019-08-30 LAB — SAMPLE TO BLOOD BANK

## 2019-08-30 LAB — TROPONIN I (HIGH SENSITIVITY)
Troponin I (High Sensitivity): 3 ng/L (ref <18)
Troponin I (High Sensitivity): 3 ng/L (ref <18)

## 2019-08-30 LAB — RESPIRATORY PANEL BY RT PCR (FLU A&B, COVID)
Influenza A by PCR: NEGATIVE
Influenza B by PCR: NEGATIVE
SARS Coronavirus 2 by RT PCR: NEGATIVE

## 2019-08-30 LAB — POC OCCULT BLOOD, ED: Fecal Occult Bld: POSITIVE — AB

## 2019-08-30 LAB — CBG MONITORING, ED: Glucose-Capillary: 172 mg/dL — ABNORMAL HIGH (ref 70–99)

## 2019-08-30 SURGERY — IMAGING PROCEDURE, GI TRACT, INTRALUMINAL, VIA CAPSULE

## 2019-08-30 MED ORDER — ONDANSETRON HCL 4 MG/2ML IJ SOLN: 4.0000 mg | Freq: Four times a day (QID) | INTRAMUSCULAR | Status: DC | PRN

## 2019-08-30 MED ORDER — METOPROLOL SUCCINATE ER 50 MG PO TB24
50.0000 mg | ORAL_TABLET | Freq: Every day | ORAL | Status: DC
  Filled 2019-08-30: qty 1

## 2019-08-30 MED ORDER — SODIUM CHLORIDE 0.9% FLUSH: 3.0000 mL | Freq: Once | INTRAVENOUS | Status: DC

## 2019-08-30 MED ORDER — ACETAMINOPHEN 650 MG RE SUPP: 650.0000 mg | Freq: Four times a day (QID) | RECTAL | Status: DC | PRN

## 2019-08-30 MED ORDER — PANTOPRAZOLE SODIUM 40 MG IV SOLR
40.0000 mg | Freq: Once | INTRAVENOUS | Status: AC
  Administered 2019-08-30: 14:00:00 40 mg via INTRAVENOUS
  Filled 2019-08-30: qty 40

## 2019-08-30 MED ORDER — LACTATED RINGERS IV SOLN: INTRAVENOUS | Status: DC

## 2019-08-30 MED ORDER — PANTOPRAZOLE SODIUM 40 MG IV SOLR
40.0000 mg | Freq: Two times a day (BID) | INTRAVENOUS | Status: DC
  Administered 2019-08-30 – 2019-08-31 (×2): 40 mg via INTRAVENOUS
  Filled 2019-08-30 (×2): qty 40

## 2019-08-30 MED ORDER — ONDANSETRON HCL 4 MG PO TABS: 4.0000 mg | ORAL_TABLET | Freq: Four times a day (QID) | ORAL | Status: DC | PRN

## 2019-08-30 MED ORDER — PNEUMOCOCCAL VAC POLYVALENT 25 MCG/0.5ML IJ INJ: 0.5000 mL | INJECTION | INTRAMUSCULAR | Status: DC | PRN

## 2019-08-30 MED ORDER — SODIUM CHLORIDE 0.9 % IV SOLN: INTRAVENOUS | Status: DC

## 2019-08-30 MED ORDER — ACETAMINOPHEN 325 MG PO TABS: 650.0000 mg | ORAL_TABLET | Freq: Four times a day (QID) | ORAL | Status: DC | PRN

## 2019-08-30 MED ORDER — DILTIAZEM HCL ER COATED BEADS 240 MG PO CP24
240.0000 mg | ORAL_CAPSULE | Freq: Every day | ORAL | Status: DC
  Administered 2019-08-31: 10:00:00 240 mg via ORAL
  Filled 2019-08-30 (×5): qty 1

## 2019-08-30 MED ORDER — METOPROLOL SUCCINATE ER 50 MG PO TB24
100.0000 mg | ORAL_TABLET | Freq: Every day | ORAL | Status: DC
  Filled 2019-08-30: qty 2

## 2019-08-30 NOTE — H&P (Signed)
History and Physical    Julia Martinez OFB:[id number] DOB: November 10, 1950 DOA: 08/30/2019  PCP: Celene Squibb, MD   Patient coming from: Home  Chief Complaint: Worsening weakness and dark stool  HPI: Julia Martinez is a 69 y.o. female with medical history significant for hypertension, depression, and prior GI bleed of unidentified source in 2019 who presented to the ED with increasing generalized weakness over 1 week.  She has apparently been under a lot of stress with her special needs son and she feels that this is contributing to her weakness.  Notably, she did have a bowel movement this morning with darker stools noted.  She did not notice any overt bleeding, but states that it was more of a burgundy color.  She appears to have some acid reflux symptoms at night, but denies any frank abdominal pain, nausea, vomiting, diarrhea, or recent weight loss.  She denies any fevers or chills.   ED Course: Stable vital signs noted.  Creatinine is currently 1.02 with baseline 0.8-0.9.  Her hemoglobin is notably 7.6 with baseline around 9.  EKG with sinus rhythm at 56 bpm noted.  She has had positive stool occult noted in the ED and is on iron supplementation.  GI has already evaluated patient with plans for Givens capsule today.  Review of Systems: All others reviewed as noted above and otherwise negative.  Past Medical History:  Diagnosis Date  . Depression   . Essential hypertension   . OSA (obstructive sleep apnea)    Mild by sleep study 2019  . PSVT (paroxysmal supraventricular tachycardia) ([place])     Past Surgical History:  Procedure Laterality Date  . CESAREAN SECTION    . COLONOSCOPY  2009   single external hemorrhoid tag, otherwise normal rectum. Numerous left-sided diverticula, abnormal IC valve and TI suggestive of Crohn's disease s/p biopsy. Biopsies with non-specific ulcerations. CTE then revealing chronic inflammatory change of distal TI, concerning for SB Crohn's. Did not start therapy  due to absence of clinical symptoms and recommended [facility] evaluation.  . COLONOSCOPY N/A 07/21/2017   Procedure: COLONOSCOPY;  Surgeon: Danie Binder, MD;  [facility];  Service: Endoscopy;  Laterality: N/A;  . ESOPHAGOGASTRODUODENOSCOPY N/A 07/20/2017   Procedure: ESOPHAGOGASTRODUODENOSCOPY (EGD);  Surgeon: Daneil Dolin, MD;  Location: [facility];  Service: Endoscopy;  Laterality: N/A;  . WISDOM TOOTH EXTRACTION       reports that she has never smoked. She has never used smokeless tobacco. She reports that she does not drink alcohol or use drugs.  Allergies  Allergen Reactions  . Penicillins Nausea Only    Family History  Problem Relation Age of Onset  . Depression Mother   . Ulcers Mother   . Colon cancer Neg Hx   . Colon polyps Neg Hx     Prior to Admission medications   Medication Sig Start Date End Date Taking? Authorizing Provider  diltiazem (DILACOR XR) 240 MG 24 hr capsule Take 1 capsule (240 mg total) by mouth daily. 06/24/19  Yes Satira Sark, MD  metoprolol succinate (TOPROL-XL) 100 MG 24 hr tablet Take 1 tablet (100 mg total) by mouth daily. Take with or immediately following a meal. 06/24/19  Yes Satira Sark, MD  metoprolol succinate (TOPROL-XL) 50 MG 24 hr tablet TAKE ONE TABLET (50MG) BY MOUTH ALONG WITH METOPROLOL 100MG FOR A TOTAL OF 150MG ONCE DAILY 06/24/19  Yes Satira Sark, MD  ferrous sulfate 325 (65 FE) MG EC tablet Take  325 mg by mouth daily.    [provider]  hydrochlorothiazide (HYDRODIURIL) 25 MG tablet Take 1 tablet (25 mg total) by mouth daily. Patient not taking: Reported on 08/30/2019 06/24/19   Satira Sark, MD    Physical Exam: Vitals:   08/30/19 0948 08/30/19 0952 08/30/19 1000 08/30/19 1200  BP:  (!) 160/70 (!) 161/70   Pulse:  (!) 58 61 (!) 59  Resp:  18 17 (!) 22  Temp:  97.8 F (36.6 C)    TempSrc:  Oral    SpO2:  100% 98% 100%  Weight: 81.5 kg     Height: 5' 4"  (1.626 m)        Constitutional: NAD, calm, comfortable Vitals:   08/30/19 0948 08/30/19 0952 08/30/19 1000 08/30/19 1200  BP:  (!) 160/70 (!) 161/70   Pulse:  (!) 58 61 (!) 59  Resp:  18 17 (!) 22  Temp:  97.8 F (36.6 C)    TempSrc:  Oral    SpO2:  100% 98% 100%  Weight: 81.5 kg     Height: 5' 4"  (1.626 m)      Eyes: lids and conjunctivae normal ENMT: Mucous membranes are moist.  Neck: normal, supple Respiratory: clear to auscultation bilaterally. Normal respiratory effort. No accessory muscle use.  Cardiovascular: Regular rate and rhythm, no murmurs. No extremity edema. Abdomen: no tenderness, no distention. Bowel sounds positive.  Musculoskeletal:  No joint deformity upper and lower extremities.   Skin: no rashes, lesions, ulcers.  Psychiatric: Normal judgment and insight. Alert and oriented x 3. Normal mood.   Labs on Admission: I have personally reviewed following labs and imaging studies  CBC: Recent Labs  Lab 08/30/19 1031  WBC 6.5  HGB 7.6*  HCT 26.9*  MCV 82.0  PLT 676   Basic Metabolic Panel: Recent Labs  Lab 08/30/19 1031  NA 137  K 4.0  CL 108  CO2 24  GLUCOSE 168*  BUN 20  CREATININE 1.02*  CALCIUM 9.3   GFR: Estimated Creatinine Clearance: 55.3 mL/min (A) (by C-G formula based on SCr of 1.02 mg/dL (H)). Liver Function Tests: No results for input(s): AST, ALT, ALKPHOS, BILITOT, PROT, ALBUMIN in the last 168 hours. No results for input(s): LIPASE, AMYLASE in the last 168 hours. No results for input(s): AMMONIA in the last 168 hours. Coagulation Profile: No results for input(s): INR, PROTIME in the last 168 hours. Cardiac Enzymes: No results for input(s): CKTOTAL, CKMB, CKMBINDEX, TROPONINI in the last 168 hours. BNP (last 3 results) No results for input(s): PROBNP in the last 8760 hours. HbA1C: No results for input(s): HGBA1C in the last 72 hours. CBG: Recent Labs  Lab 08/30/19 1023  GLUCAP 172*   Lipid Profile: No results for input(s): CHOL,  HDL, LDLCALC, TRIG, CHOLHDL, LDLDIRECT in the last 72 hours. Thyroid Function Tests: No results for input(s): TSH, T4TOTAL, FREET4, T3FREE, THYROIDAB in the last 72 hours. Anemia Panel: Recent Labs    08/30/19 1031  FERRITIN 2*  TIBC 419  IRON 7*   Urine analysis:    Component Value Date/Time   COLORURINE STRAW (A) 08/30/2019 1248   APPEARANCEUR CLEAR 08/30/2019 1248   LABSPEC 1.009 08/30/2019 1248   PHURINE 7.0 08/30/2019 1248   GLUCOSEU NEGATIVE 08/30/2019 1248   HGBUR SMALL (A) 08/30/2019 1248   BILIRUBINUR NEGATIVE 08/30/2019 1248   KETONESUR NEGATIVE 08/30/2019 1248   PROTEINUR NEGATIVE 08/30/2019 1248   NITRITE NEGATIVE 08/30/2019 1248   LEUKOCYTESUR NEGATIVE 08/30/2019 1248  Radiological Exams on Admission: No results found.  EKG: Independently reviewed. SR 56bpm.  Assessment/Plan Active Problems:   * No active hospital problems. *    Symptomatic acute blood loss anemia -Noted prior endoscopy in 2019 with diverticulosis noted on colonoscopy as well as small external hemorrhoids -Noted to have prior to iron deficiency anemia and is on supplementation -Transfuse for hemoglobin less than 7 -IV PPI twice daily -Iron panel ordered -N.p.o. for now with Givens capsule per GI -SCDs for DVT prophylaxis -Continue on gentle IV fluid for maintenance  Hypertension -Resume diltiazem and metoprolol -Hold HCTZ for now until off n.p.o. status  Depression -Not currently on medication, monitor  Obesity -BMI 30.83 -Lifestyle changes  DVT prophylaxis: SCDs Code Status: Full Family Communication: Husband at bedside Disposition Plan:Givens Capsule per GI, follow CBC and transfuse as needed Consults called:GI Admission status: Obs, MedSurg   Garlene Apperson D Chamar Broughton DO [facility]  If 7PM-7AM, please contact night-coverage [email or web address]  08/30/2019, 3:11 PM

## 2019-08-30 NOTE — ED Provider Notes (Signed)
[facility] EMERGENCY DEPARTMENT Provider Note   CSN: [id number] Arrival date & time: 08/30/19  0941     History Chief Complaint  Patient presents with  . Weakness    Julia Martinez is a 69 y.o. female with a history of depression, hypertension, history of GI bleed of unclear source with anemia requiring blood transfusion, presenting for a 1 week history of increasing generalized weakness.  This morning she had a bowel movement that was darker in color but denied having any frank blood or melanotic type stool.  She reports increased anxiety and stress at home as she has a chronically ill adult child which she states causes undue stress and anxiety and insomnia for her, mentions this as possible source of her weakness.  She denies focal weakness, she does have transient lightheadedness with positional changes over the past several days.  She denies abdominal pain, distention, heartburn, chest pain, shortness of breath, also no nausea or vomiting.  Her stools have been normal consistency but as mentioned darker than normal today.  Patient underwent colonoscopy March 2019 revealing diverticulosis, there were some ulcerations suggesting of Crohn's disease.  She was scheduled to have a Givens capsule study which she states was never completed.  The history is provided by the patient, the spouse and a relative.       Past Medical History:  Diagnosis Date  . Depression   . Essential hypertension   . OSA (obstructive sleep apnea)    Mild by sleep study 2019  . PSVT (paroxysmal supraventricular tachycardia) [facility])     Patient Active Problem List   Diagnosis Date Noted  . GI bleed 07/20/2017  . Symptomatic anemia 07/20/2017  . Essential hypertension 07/20/2017  . Obesity (BMI 30-39.9) 07/20/2017  . Lack of coordination 10/24/2013  . ISCHEMIC COLITIS 05/21/2009    Past Surgical History:  Procedure Laterality Date  . CESAREAN SECTION    . COLONOSCOPY  2009   single external  hemorrhoid tag, otherwise normal rectum. Numerous left-sided diverticula, abnormal IC valve and TI suggestive of Crohn's disease s/p biopsy. Biopsies with non-specific ulcerations. CTE then revealing chronic inflammatory change of distal TI, concerning for SB Crohn's. Did not start therapy due to absence of clinical symptoms and recommended [facility] evaluation.  . COLONOSCOPY N/A 07/21/2017   Procedure: COLONOSCOPY;  Surgeon: Danie Binder, MD;  Location: [facility] ENDO SUITE;  Service: Endoscopy;  Laterality: N/A;  . ESOPHAGOGASTRODUODENOSCOPY N/A 07/20/2017   Procedure: ESOPHAGOGASTRODUODENOSCOPY (EGD);  Surgeon: Daneil Dolin, MD;  Location: [facility] ENDO SUITE;  Service: Endoscopy;  Laterality: N/A;  . WISDOM TOOTH EXTRACTION       OB History   No obstetric history on file.     Family History  Problem Relation Age of Onset  . Depression Mother   . Ulcers Mother   . Colon cancer Neg Hx   . Colon polyps Neg Hx     Social History   Tobacco Use  . Smoking status: Never Smoker  . Smokeless tobacco: Never Used  Substance Use Topics  . Alcohol use: No  . Drug use: No    Home Medications Prior to Admission medications   Medication Sig Start Date End Date Taking? Authorizing Provider  diltiazem (DILACOR XR) 240 MG 24 hr capsule Take 1 capsule (240 mg total) by mouth daily. 06/24/19  Yes Satira Sark, MD  metoprolol succinate (TOPROL-XL) 100 MG 24 hr tablet Take 1 tablet (100 mg total) by mouth daily. Take with or immediately following a  meal. 06/24/19  Yes Satira Sark, MD  metoprolol succinate (TOPROL-XL) 50 MG 24 hr tablet TAKE ONE TABLET (50MG) BY MOUTH ALONG WITH METOPROLOL 100MG FOR A TOTAL OF 150MG ONCE DAILY 06/24/19  Yes Satira Sark, MD  ferrous sulfate 325 (65 FE) MG EC tablet Take 325 mg by mouth daily.    [provider]  hydrochlorothiazide (HYDRODIURIL) 25 MG tablet Take 1 tablet (25 mg total) by mouth daily. Patient not taking: Reported on 08/30/2019 06/24/19    Satira Sark, MD    Allergies    Penicillins  Review of Systems   Review of Systems  Constitutional: Negative for chills and fever.  HENT: Negative for congestion and sore throat.   Eyes: Negative.   Respiratory: Negative for chest tightness and shortness of breath.   Cardiovascular: Negative for chest pain.  Gastrointestinal: Negative for abdominal pain, blood in stool, nausea and vomiting.       Dark but not bloody stools  Genitourinary: Negative.   Musculoskeletal: Negative for arthralgias, joint swelling and neck pain.  Skin: Negative.  Negative for rash and wound.  Neurological: Positive for weakness and light-headedness. Negative for dizziness, numbness and headaches.  Psychiatric/Behavioral: Negative.     Physical Exam Updated Vital Signs BP (!) 161/70   Pulse (!) 59   Temp 97.8 F (36.6 C) (Oral)   Resp (!) 22   Ht 5' 4"  (1.626 m)   Wt 81.5 kg   SpO2 100%   BMI 30.83 kg/m   Physical Exam Vitals and nursing note reviewed. Exam conducted with a chaperone present.  Constitutional:      Appearance: She is well-developed.  HENT:     Head: Normocephalic and atraumatic.  Eyes:     Conjunctiva/sclera: Conjunctivae normal.  Cardiovascular:     Rate and Rhythm: Normal rate and regular rhythm.     Heart sounds: Normal heart sounds.  Pulmonary:     Effort: Pulmonary effort is normal.     Breath sounds: Normal breath sounds. No wheezing.  Abdominal:     General: Abdomen is protuberant. Bowel sounds are normal.     Palpations: Abdomen is soft.     Tenderness: There is no abdominal tenderness. There is no guarding.  Genitourinary:    Comments: Small external tag.  Dark mucoid residue.  Not tarry.  Strongly hemoccult positive.  Musculoskeletal:        General: Normal range of motion.     Cervical back: Normal range of motion.  Skin:    General: Skin is warm and dry.  Neurological:     Mental Status: She is alert.     ED Results / Procedures / Treatments    Labs (all labs ordered are listed, but only abnormal results are displayed) Labs Reviewed  BASIC METABOLIC PANEL - Abnormal; Notable for the following components:      Result Value   Glucose, Bld 168 (*)    Creatinine, Ser 1.02 (*)    GFR calc non Af Amer 57 (*)    All other components within normal limits  CBC - Abnormal; Notable for the following components:   RBC 3.28 (*)    Hemoglobin 7.6 (*)    HCT 26.9 (*)    MCH 23.2 (*)    MCHC 28.3 (*)    All other components within normal limits  CBG MONITORING, ED - Abnormal; Notable for the following components:   Glucose-Capillary 172 (*)    All other components within normal limits  POC OCCULT BLOOD, ED - Abnormal; Notable for the following components:   Fecal Occult Bld POSITIVE (*)    All other components within normal limits  URINALYSIS, ROUTINE W REFLEX MICROSCOPIC  SAMPLE TO BLOOD BANK  TROPONIN I (HIGH SENSITIVITY)  TROPONIN I (HIGH SENSITIVITY)    EKG None  Radiology No results found.  Procedures Procedures (including critical care time)  Medications Ordered in ED Medications  sodium chloride flush (NS) 0.9 % injection 3 mL (0 mLs Intravenous Hold 08/30/19 1014)  pantoprazole (PROTONIX) injection 40 mg (has no administration in time range)    ED Course  I have reviewed the triage vital signs and the nursing notes.  Pertinent labs & imaging results that were available during my care of the patient were reviewed by me and considered in my medical decision making (see chart for details).    MDM Rules/Calculators/A&P                      Patient's labs reviewed and discussed with patient.  She has a significant anemia at 7.6, dropping 1.4 g from last check early March.  This is a microcytic pattern.  She is strongly Hemoccult positive.  She did not have any nominal complaints of pain or cramping.  She does have significant generalized weakness and fatigue.  Discussed this patient with Dr. Oneida Alar who will consult  patient, requested she be n.p.o. with plans for agile capsule this afternoon at 4 PM.  Request hospitalist admission.  Discussed with Dr. Manuella Ghazi with the hospitalist group who accepts pt for admission.  Final Clinical Impression(s) / ED Diagnoses Final diagnoses:  Anemia, unspecified type  Gastrointestinal hemorrhage, unspecified gastrointestinal hemorrhage type  Weakness    Rx / DC Orders ED Discharge Orders    None       Landis Martins 08/30/19 1329    Isla Pence, MD 08/30/19 1502

## 2019-08-30 NOTE — Consult Note (Signed)
Referring Provider: Evalee Jefferson, PA-C Primary Care Physician:  Celene Squibb, MD Primary Gastroenterologist:  Dr. Oneida Alar  Date of Admission: 08/30/19 Date of Consultation: 08/30/19  Reason for Consultation:  Anemia with heme positive stool  HPI:  Julia Martinez is a 69 y.o. year old female with history of depression, HTN, and GI bleed in 2019 without identified source who presented to the emergency room today due to 1 week of increasing generalized weakness.  Labs in the ED with hemoglobin 7.6, MCH 23.2 (L), MCHC 28.3 (L).  Kidney function slightly bumped at 1.02.  BUN 20 (within normal limits).  FOBT positive.  She received 1 dose of Protonix IV and GI was consulted.   Prior evaluation for anemia/heme positive stool included EGD March 2019 revealing normal esophagus, small hiatal hernia, normal examined duodenum, no blood in the upper GI tract.  Colonoscopy March 2019 with multiple ileal diverticula, moderate diverticulosis in the entire examined colon, small external hemorrhoids.  No source for anemia/GI bleed was identified.  Recommended agile capsule but this was never completed.   Prior colonoscopy in 2009 with abnormal IC valve and TI suggestive of Crohn's disease.  Biopsies with nonspecific ulcerations.  CTE revealed chronic inflammatory change of distal TI, concerning for SB Crohn's.  No therapy was initiated due to absence of clinical symptoms and recommended evaluation at [facility].  Today:  History of anemia. Has had 2 iron transfusions in 2020. Started feeling week 3-4 weeks off and on. This has been worsening. Has a special needs son and has been under a lot of stress and felt this was contributing to her weakness.   Stools were darker this morning. Not black. Somewhat burgandy color. No bright red. No abdominal pain. Admits to reflux symptoms and burning in her lower esophagus at night.  Occurs a few times a week. Has been off and on for years. Fried foods worsen GERD symptoms. No PPI  or OTC medication for this. No NSAIDs. No abdominal pain. No dysaphia. Mild intermittent nausea. Not routinely. Associated with sinus drainage or GERD symptoms. No vomiting.   Denies fever, chills, cold or flulike symptoms.  Some associated lightheadedness. No chest pain. Rare palpitations. No shortness of breath. No cough. No hematuria, vaginal bleeding, or nose bleeds.   BMs daily without constipation or diarrhea.   Hasn't been taking iron at home.   Last ate a bacon tomato biscuit around 8:30 am.   No abdominal surgeries other than 1 C section.   Past Medical History:  Diagnosis Date  . Depression   . Essential hypertension   . OSA (obstructive sleep apnea)    Mild by sleep study 2019  . PSVT (paroxysmal supraventricular tachycardia) ([place])     Past Surgical History:  Procedure Laterality Date  . CESAREAN SECTION    . COLONOSCOPY  2009   single external hemorrhoid tag, otherwise normal rectum. Numerous left-sided diverticula, abnormal IC valve and TI suggestive of Crohn's disease s/p biopsy. Biopsies with non-specific ulcerations. CTE then revealing chronic inflammatory change of distal TI, concerning for SB Crohn's. Did not start therapy due to absence of clinical symptoms and recommended [facility] evaluation.  . COLONOSCOPY N/A 07/21/2017   Procedure: COLONOSCOPY;  Surgeon: Danie Binder, MD;  Location: [facility];  Service: Endoscopy;  Laterality: N/A;  . ESOPHAGOGASTRODUODENOSCOPY N/A 07/20/2017   Procedure: ESOPHAGOGASTRODUODENOSCOPY (EGD);  Surgeon: Daneil Dolin, MD;  Location: [facility];  Service: Endoscopy;  Laterality: N/A;  . WISDOM TOOTH EXTRACTION  Prior to Admission medications   Medication Sig Start Date End Date Taking? Authorizing Provider  diltiazem (DILACOR XR) 240 MG 24 hr capsule Take 1 capsule (240 mg total) by mouth daily. 06/24/19  Yes Satira Sark, MD  metoprolol succinate (TOPROL-XL) 100 MG 24 hr tablet Take 1 tablet (100 mg total) by mouth  daily. Take with or immediately following a meal. 06/24/19  Yes Satira Sark, MD  metoprolol succinate (TOPROL-XL) 50 MG 24 hr tablet TAKE ONE TABLET (50MG) BY MOUTH ALONG WITH METOPROLOL 100MG FOR A TOTAL OF 150MG ONCE DAILY 06/24/19  Yes Satira Sark, MD  ferrous sulfate 325 (65 FE) MG EC tablet Take 325 mg by mouth daily.    [provider]  hydrochlorothiazide (HYDRODIURIL) 25 MG tablet Take 1 tablet (25 mg total) by mouth daily. Patient not taking: Reported on 08/30/2019 06/24/19   Satira Sark, MD    Current Facility-Administered Medications  Medication Dose Route Frequency Provider Last Rate Last Admin  . pantoprazole (PROTONIX) injection 40 mg  40 mg Intravenous Q12H Dalton Molesworth S, PA-C      . sodium chloride flush (NS) 0.9 % injection 3 mL  3 mL Intravenous Once Evalee Jefferson, PA-C   Stopped at 08/30/19 1014   Current Outpatient Medications  Medication Sig Dispense Refill  . diltiazem (DILACOR XR) 240 MG 24 hr capsule Take 1 capsule (240 mg total) by mouth daily. 90 capsule 3  . metoprolol succinate (TOPROL-XL) 100 MG 24 hr tablet Take 1 tablet (100 mg total) by mouth daily. Take with or immediately following a meal. 90 tablet 3  . metoprolol succinate (TOPROL-XL) 50 MG 24 hr tablet TAKE ONE TABLET (50MG) BY MOUTH ALONG WITH METOPROLOL 100MG FOR A TOTAL OF 150MG ONCE DAILY 90 tablet 3  . ferrous sulfate 325 (65 FE) MG EC tablet Take 325 mg by mouth daily.    . hydrochlorothiazide (HYDRODIURIL) 25 MG tablet Take 1 tablet (25 mg total) by mouth daily. (Patient not taking: Reported on 08/30/2019) 90 tablet 3    Allergies as of 08/30/2019 - Review Complete 08/30/2019  Allergen Reaction Noted  . Penicillins Nausea Only 10/06/2013    Family History  Problem Relation Age of Onset  . Depression Mother   . Ulcers Mother   . Colon cancer Neg Hx   . Colon polyps Neg Hx     Social History   Socioeconomic History  . Marital status: Married    Spouse name: Not on  file  . Number of children: Not on file  . Years of education: Not on file  . Highest education level: Not on file  Occupational History  . Occupation: part-time for [employer]  Tobacco Use  . Smoking status: Never Smoker  . Smokeless tobacco: Never Used  Substance and Sexual Activity  . Alcohol use: No  . Drug use: No  . Sexual activity: Never  Other Topics Concern  . Not on file  Social History Narrative  . Not on file   Social Determinants of Health   Financial Resource Strain:   . Difficulty of Paying Living Expenses:   Food Insecurity:   . Worried About Charity fundraiser in the Last Year:   . Arboriculturist in the Last Year:   Transportation Needs:   . Film/video editor (Medical):   Marland Kitchen Lack of Transportation (Non-Medical):   Physical Activity:   . Days of Exercise per Week:   . Minutes of Exercise per Session:  Stress:   . Feeling of Stress :   Social Connections:   . Frequency of Communication with Friends and Family:   . Frequency of Social Gatherings with Friends and Family:   . Attends Religious Services:   . Active Member of Clubs or Organizations:   . Attends Archivist Meetings:   Marland Kitchen Marital Status:   Intimate Partner Violence:   . Fear of Current or Ex-Partner:   . Emotionally Abused:   Marland Kitchen Physically Abused:   . Sexually Abused:     Review of Systems: Gen: See HPI CV: See HPI Resp: See HPI GI: See HPI GU : Denies urinary burning, urinary frequency, urinary incontinence.  MS: Denies joint pain. Derm: Denies rash Psych: Denies depression or anxiety Heme: See HPI  Physical Exam: Vital signs in last 24 hours: Temp:  [97.8 F (36.6 C)] 97.8 F (36.6 C) (04/13 0952) Pulse Rate:  [58-61] 59 (04/13 1200) Resp:  [17-22] 22 (04/13 1200) BP: (160-161)/(70) 161/70 (04/13 1000) SpO2:  [98 %-100 %] 100 % (04/13 1200) Weight:  [81.5 kg] 81.5 kg (04/13 0948)   General:   Alert,  Well-developed, well-nourished, pleasant and cooperative in  NAD Head:  Normocephalic and atraumatic. Eyes:  Sclera clear, no icterus.   Conjunctiva pink. Ears:  Normal auditory acuity. Lungs:  Clear throughout to auscultation.   No wheezes, crackles, or rhonchi. No acute distress. Heart:  Regular rate and rhythm; no murmurs, clicks, rubs,  or gallops. Abdomen:  Soft, nontender and nondistended. No masses, hepatosplenomegaly or hernias noted. Normal bowel sounds, without guarding, and without rebound.   Rectal:  Deferred Msk:  Symmetrical without gross deformities. Normal posture. Extremities:  Without edema. Neurologic:  Alert and  oriented x4;  grossly normal neurologically. Skin:  Intact without significant lesions or rashes. Psych: Normal mood and affect.  Lab Results: Recent Labs    08/30/19 1031  WBC 6.5  HGB 7.6*  HCT 26.9*  PLT 283   BMET Recent Labs    08/30/19 1031  NA 137  K 4.0  CL 108  CO2 24  GLUCOSE 168*  BUN 20  CREATININE 1.02*  CALCIUM 9.3    Impression: 69 year old female with history of depression, HTN, and GI bleed in 2019 without identified source to presented to the emergency room today for 1 week of increased generalized weakness found to have hemoglobin of 7.6 and heme positive stool. BUN within normal limits. She reports 1 episode of darker or burgundy colored stool this morning.  Denies abdominal pain, bright red blood per rectum.  Chronic history of intermittent GERD and intermittent nausea not on a PPI.  No NSAIDs. Last meal at 8:30am. Not on oral iron.   Prior GI evaluation for anemia and heme positive stool in 2019 unrevealing.  EGD with normal esophagus, small hiatal hernia, normal examined duodenum.  Colonoscopy with multiple ileal diverticula, moderate diverticulosis in the entire colon, small external hemorrhoids.  She was recommended to have agile capsule but this was never completed.  Interestingly, patient had colonoscopy in 2009 with abnormal IC valve and TI with nonspecific ulcerations.  CTE with  chronic inflammatory change of the distal TI with concerns for SB Crohn's.  She does not have any symptoms consistent with Crohn's disease at this time.  Suspect anemia is secondary to occult GI blood loss from the small bowel or above with differentials including gastritis, H. pylori, PUD, AVMs, or polyps. Doubt Crohn's as she has no clinical symptoms.   Discussed with Dr.  Fields who recommended Givens Capsule today at 5 pm as patients last meal was at 8:30 am.   Plan: Givens Capsule today at [place] for now.  IV Protonix BID Monitor H/H and transfuse as necessary.  Monitor for overt GI bleeding.  Update iron panel.  Further recommendations to follow Givens capsule.      LOS: 0 days    08/30/2019, 2:21 PM   Aliene Altes, [facility]

## 2019-08-30 NOTE — Progress Notes (Signed)
Patient's hear rate is 58, scheduled to get Toprol XL 199m. Medication held and Mid-level paged. Will continue to monitor.

## 2019-08-30 NOTE — ED Notes (Signed)
Pt was informed that we need a urine sample. Pt states that she can not urinate at this time.

## 2019-08-30 NOTE — ED Triage Notes (Signed)
Pt says she has a special needs son and is under a lot of stress.  Reports has felt generally weak for the past week.  Pt says this morning she had a bm and thought the color was "different."  Pt says she didn't see any blood in stool but stool was dark brown. Reports history of anemia but hasn't had her blood work checked in a while.

## 2019-08-31 ENCOUNTER — Observation Stay (HOSPITAL_COMMUNITY): Payer: Medicare Other

## 2019-08-31 DIAGNOSIS — D649 Anemia, unspecified: Secondary | ICD-10-CM | POA: Diagnosis not present

## 2019-08-31 DIAGNOSIS — K8020 Calculus of gallbladder without cholecystitis without obstruction: Secondary | ICD-10-CM | POA: Diagnosis not present

## 2019-08-31 DIAGNOSIS — K5790 Diverticulosis of intestine, part unspecified, without perforation or abscess without bleeding: Secondary | ICD-10-CM | POA: Diagnosis not present

## 2019-08-31 DIAGNOSIS — D62 Acute posthemorrhagic anemia: Secondary | ICD-10-CM

## 2019-08-31 DIAGNOSIS — K922 Gastrointestinal hemorrhage, unspecified: Secondary | ICD-10-CM | POA: Diagnosis not present

## 2019-08-31 DIAGNOSIS — R531 Weakness: Secondary | ICD-10-CM | POA: Diagnosis not present

## 2019-08-31 LAB — CBC
HCT: 25.9 % — ABNORMAL LOW (ref 36.0–46.0)
Hemoglobin: 7.4 g/dL — ABNORMAL LOW (ref 12.0–15.0)
MCH: 23.1 pg — ABNORMAL LOW (ref 26.0–34.0)
MCHC: 28.6 g/dL — ABNORMAL LOW (ref 30.0–36.0)
MCV: 80.7 fL (ref 80.0–100.0)
Platelets: 285 10*3/uL (ref 150–400)
RBC: 3.21 MIL/uL — ABNORMAL LOW (ref 3.87–5.11)
RDW: 15.1 % (ref 11.5–15.5)
WBC: 6.1 10*3/uL (ref 4.0–10.5)
nRBC: 0 % (ref 0.0–0.2)

## 2019-08-31 LAB — PREPARE RBC (CROSSMATCH)

## 2019-08-31 LAB — BASIC METABOLIC PANEL
Anion gap: 6 (ref 5–15)
BUN: 12 mg/dL (ref 8–23)
CO2: 24 mmol/L (ref 22–32)
Calcium: 9.8 mg/dL (ref 8.9–10.3)
Chloride: 108 mmol/L (ref 98–111)
Creatinine, Ser: 0.84 mg/dL (ref 0.44–1.00)
GFR calc Af Amer: >60 mL/min (ref >60)
GFR calc non Af Amer: >60 mL/min (ref >60)
Glucose, Bld: 110 mg/dL — ABNORMAL HIGH (ref 70–99)
Potassium: 3.9 mmol/L (ref 3.5–5.1)
Sodium: 138 mmol/L (ref 135–145)

## 2019-08-31 LAB — MAGNESIUM: Magnesium: 1.9 mg/dL (ref 1.7–2.4)

## 2019-08-31 LAB — HIV ANTIBODY (ROUTINE TESTING W REFLEX): HIV Screen 4th Generation wRfx: NONREACTIVE

## 2019-08-31 MED ORDER — IOHEXOL 350 MG/ML SOLN
100.0000 mL | Freq: Once | INTRAVENOUS | Status: AC | PRN
  Administered 2019-08-31: 12:00:00 100 mL via INTRAVENOUS

## 2019-08-31 MED ORDER — SODIUM CHLORIDE 0.9 % IV SOLN
510.0000 mg | Freq: Once | INTRAVENOUS | Status: AC
  Administered 2019-08-31: 510 mg via INTRAVENOUS
  Filled 2019-08-31: qty 17

## 2019-08-31 MED ORDER — SODIUM CHLORIDE 0.9% IV SOLUTION: Freq: Once | INTRAVENOUS | Status: AC

## 2019-08-31 MED ORDER — PANTOPRAZOLE SODIUM 40 MG PO TBEC: 40.0000 mg | DELAYED_RELEASE_TABLET | Freq: Every day | ORAL | 1 refills | Status: DC

## 2019-08-31 NOTE — Op Note (Addendum)
Procedure date:  08/30/19  Pre-Operative Diagnosis:  Iron Deficiency Anemia, Heme Positive Stool  Post-Operative Diagnosis: Ulcerated Distal Small Bowel, Small Bowel AVMs, Small Bowel Erosions   Procedure:  Givens Capsule Study  Procedure date:   08/30/19  Surgeon:  Dorothyann Peng, MD  Patient data:  Wt: 183 lbs, Ht: 5'4"  First Gastric image:  00:01:18 First Duodenal image: 00:25:41 First Cecal image: N/A. Incomplete study. Gastric Passage time: 24 min 23 sec Small Bowel Passage time: Unable to calculate  Results:  No abnormalities identified in the stomach although views are limited due to retained gastric contents. Small bowel AVMs, erosions, and ulcerated distal small bowel, LIKELY IN DISTAL JEJUNUM/PROXIMAL ILEUM. Small amounts red blood seen in the distal small bowel at times 8:10:57 and 8:11:15. No masses. Study was not complete to the cecum.   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  DIFFERENTIAL DIAGNOSIS INCLUDES: ULCERS DUE TO IBD, ISCHEMIA, OR LESS LIKELY CARCINOID TUMOR.  Plan:  1. CTA today. Further recommendations to follow.  2. Continue Protonix BID 3. Feraheme today. 4. REPEAT KUB IN 30 DAYS IF CAPSULE HAS NOT PASSED. 5. CHECK PROMETHEUS IBD PANEL AS OUPT.

## 2019-08-31 NOTE — Discharge Summary (Signed)
Physician Discharge Summary  Julia Martinez WER:[id number] DOB: 1950-12-22 DOA: 08/30/2019  PCP: Celene Squibb, MD GI: [facility]  Admit date: 08/30/2019 Discharge date: 08/31/2019  Admitted From:  HOME  Disposition:  HOME   Recommendations for Outpatient Follow-up:  1. Follow up with PCP in 1 weeks 2. Follow up with [facility] in 1 months 3. Establish care with hematology in 2 weeks  Discharge Condition: STABLE   CODE STATUS: FULL    Brief Hospitalization Summary: Please see all hospital notes, images, labs for full details of the hospitalization. ADMISSION HPI: Julia Martinez is a 69 y.o. female with medical history significant for hypertension, depression, and prior GI bleed of unidentified source in 2019 who presented to the ED with increasing generalized weakness over 1 week.  She has apparently been under a lot of stress with her special needs son and she feels that this is contributing to her weakness.  Notably, she did have a bowel movement this morning with darker stools noted.  She did not notice any overt bleeding, but states that it was more of a burgundy color.  She appears to have some acid reflux symptoms at night, but denies any frank abdominal pain, nausea, vomiting, diarrhea, or recent weight loss.  She denies any fevers or chills.   ED Course: Stable vital signs noted.  Creatinine is currently 1.02 with baseline 0.8-0.9.  Her hemoglobin is notably 7.6 with baseline around 9.  EKG with sinus rhythm at 56 bpm noted.  She has had positive stool occult noted in the ED and is on iron supplementation.  GI has already evaluated patient with plans for Givens capsule.   Brief Admission Hx: 69 y.o.femalewith medical history significant forhypertension, depression, and prior GI bleed of unidentified source in 2019 who presented to the ED with increasing generalized weakness over 1 week and heme positive stools.  GI has seen her and ordered capsule endoscopy.  MDM/Assessment &  Plan:   1. Chronic blood loss anemia-symptomatic anemia had prior endoscopy in 2019 with reported diverticulosis on colonoscopy and small external hemorrhoids.  She has iron deficiency anemia and had been taking oral supplementation.  Her hemoglobin and iron levels remain low.  She is on PPI therapy.  Givens capsule study per GI with findings of small bowel AVMs, erosions and distal small bowel ulcerations, amall amounts of fresh blood seen.  Spoke with GI today gave patient IV iron infusion and 1 unit  PRBC infusion.  Pt can discharge home after PRBC transfusion.  Outpatient follow up.  Referral to hematology.  Follow up with GI.  CTA abdomen no significant findings reported. 2. Essential hypertension-resume home metoprolol and diltiazem. 3. History of depression-not currently on medications.  DVT prophylaxis: SCDs Code Status: Full  Family Communication: husband at bedside updated  Disposition Plan: Home after blood transfusion with outpatient follow up  Consultants:  GI ([facility])  Procedures:  Givens Capsule Study   Antimicrobials:  n/a  Discharge Diagnoses:  Principal Problem:   Gastrointestinal hemorrhage Active Problems:   Symptomatic anemia   Acute blood loss anemia   Weakness   GI bleeding   Discharge Instructions: Discharge Instructions    Ambulatory referral to Hematology   Complete by: As directed      Allergies as of 08/31/2019      Reactions   Penicillins Nausea Only      Medication List    STOP taking these medications   hydrochlorothiazide 25 MG tablet Commonly known as: HYDRODIURIL  TAKE these medications   diltiazem 240 MG 24 hr capsule Commonly known as: DILACOR XR Take 1 capsule (240 mg total) by mouth daily.   ferrous sulfate 325 (65 FE) MG EC tablet Take 325 mg by mouth daily.   metoprolol succinate 100 MG 24 hr tablet Commonly known as: TOPROL-XL Take 1 tablet (100 mg total) by mouth daily. Take with or immediately following a  meal.   metoprolol succinate 50 MG 24 hr tablet Commonly known as: TOPROL-XL TAKE ONE TABLET (50MG) BY MOUTH ALONG WITH METOPROLOL 100MG FOR A TOTAL OF 150MG ONCE DAILY   pantoprazole 40 MG tablet Commonly known as: Protonix Take 1 tablet (40 mg total) by mouth daily.      Follow-up Information    Celene Squibb, MD Follow up in 1 week(s).   Specialty: Internal Medicine Contact information: [place] [facility] [address] [phone]        Satira Sark, MD .   Specialty: Cardiology Contact information: [place] [address] [phone]        [facility]. Schedule an appointment as soon as possible for a visit in 1 month(s).   Contact information: [place] [phone]         Allergies  Allergen Reactions  . Penicillins Nausea Only   Allergies as of 08/31/2019      Reactions   Penicillins Nausea Only      Medication List    STOP taking these medications   hydrochlorothiazide 25 MG tablet Commonly known as: HYDRODIURIL     TAKE these medications   diltiazem 240 MG 24 hr capsule Commonly known as: DILACOR XR Take 1 capsule (240 mg total) by mouth daily.   ferrous sulfate 325 (65 FE) MG EC tablet Take 325 mg by mouth daily.   metoprolol succinate 100 MG 24 hr tablet Commonly known as: TOPROL-XL Take 1 tablet (100 mg total) by mouth daily. Take with or immediately following a meal.   metoprolol succinate 50 MG 24 hr tablet Commonly known as: TOPROL-XL TAKE ONE TABLET (50MG) BY MOUTH ALONG WITH METOPROLOL 100MG FOR A TOTAL OF 150MG ONCE DAILY   pantoprazole 40 MG tablet Commonly known as: Protonix Take 1 tablet (40 mg total) by mouth daily.       Procedures/Studies: CT Angio Abd/Pel w/ and/or w/o  Result Date: 08/31/2019 CLINICAL DATA:  ANEMIA, SMALL-BOWEL AVMS AND EROSIONS BY CAPSULE ENDOSCOPY EXAM: CT ANGIOGRAPHY ABDOMEN AND PELVIS WITH  CONTRAST AND WITHOUT CONTRAST TECHNIQUE: Multidetector CT imaging of the abdomen and pelvis was performed using the standard protocol during bolus administration of intravenous contrast. Multiplanar reconstructed images and MIPs were obtained and reviewed to evaluate the vascular anatomy. CONTRAST:  154m OMNIPAQUE IOHEXOL 350 MG/ML SOLN COMPARISON:  07/20/2017 FINDINGS: VASCULAR Aorta: aortic atherosclerosis without aneurysm, dissection, occlusive process, hemorrhage or rupture. no acute vascular finding Celiac: Minor atherosclerotic origin but remains patent including its branches SMA: Minor atherosclerotic origin but remains patent including its branches Renals: Atherosclerotic origins with mild ostial narrowings but no occlusive process. No accessory renal vasculature. IMA: Remains patent off the distal aorta including its branches Inflow: Iliac atherosclerosis without inflow disease or occlusion. No acute vascular finding in the pelvis Proximal Outflow: Minor atherosclerosis but the common femoral, proximal profunda femoral, proximal superficial femoral arteries visualized are all patent Veins: No veno-occlusive process appreciated. Review of the MIP images confirms the above findings. NON-VASCULAR Lower chest: Clear lung bases. Cardiomegaly  noted. No pericardial or pleural effusion. Hepatobiliary: No significant focal hepatic abnormality. No biliary dilatation. Patent hepatic and portal veins. Gallstones noted. Biliary system unremarkable. Common bile duct nondilated. Pancreas: Unremarkable. No pancreatic ductal dilatation or surrounding inflammatory changes. Spleen: Normal in size without focal abnormality. Adrenals/Urinary Tract: Stable subcentimeter right adrenal nodularity. No other significant adrenal finding. Kidneys demonstrate no significant cortical abnormality or hydronephrosis. No perinephric inflammatory process. Ureters are symmetric and decompressed. Contrast noted in the ureters bilaterally. No  definite obstructing stone disease or hydroureter. Bladder unremarkable. Stomach/Bowel: Duodenal diverticula noted. No obstruction pattern, ileus, free air, or definite focal bowel abnormality. Scattered colonic diverticulosis. Radiopaque capsule endoscopy noted within the distal small bowel in the left abdomen. No free fluid or ascites. No fluid collection or abscess. Lymphatic: No bulky adenopathy. Reproductive: Uterus and bilateral adnexa are unremarkable. Other: No abdominal wall hernia or abnormality. No abdominopelvic ascites. Musculoskeletal: Degenerative changes of the spine. No acute osseous finding. IMPRESSION: VASCULAR Abdominal atherosclerosis without mesenteric or renal vascular occlusive process. No evidence of acute vascular finding or active GI bleeding by CTA. NON-VASCULAR Diverticulosis without acute inflammatory process Cholelithiasis Duodenal diverticula Cardiomegaly without CHF Electronically Signed   By: Jerilynn Mages.  Shick M.D.   On: 08/31/2019 14:29     Subjective: Pt reports feeling weak but otherwise no specific complaints.  No bloody or black stool.   Discharge Exam: Vitals:   08/31/19 1620 08/31/19 1655  BP: (!) 142/72 (!) 146/54  Pulse: 67 64  Resp: 18 18  Temp: 98.2 F (36.8 C) 98.3 F (36.8 C)  SpO2: 96% 98%   Vitals:   08/31/19 0622 08/31/19 0947 08/31/19 1620 08/31/19 1655  BP: (!) 155/84 (!) 143/78 (!) 142/72 (!) 146/54  Pulse: 65  67 64  Resp: 20  18 18   Temp: 98.6 F (37 C)  98.2 F (36.8 C) 98.3 F (36.8 C)  TempSrc: Oral   Oral  SpO2: 97%  96% 98%  Weight:      Height:       General: Pt is alert, awake, not in acute distress Cardiovascular: RRR, S1/S2 +, no rubs, no gallops Respiratory: CTA bilaterally, no wheezing, no rhonchi Abdominal: Soft, NT, ND, bowel sounds + Extremities: no edema, no cyanosis   The results of significant diagnostics from this hospitalization (including imaging, microbiology, ancillary and laboratory) are listed below for  reference.    Microbiology: Recent Results (from the past 240 hour(s))  Respiratory Panel by RT PCR (Flu A&B, Covid) - Nasopharyngeal Swab     Status: None   Collection Time: 08/30/19  3:17 PM   Specimen: Nasopharyngeal Swab  Result Value Ref Range Status   SARS Coronavirus 2 by RT PCR NEGATIVE NEGATIVE Final    Comment: (NOTE) SARS-CoV-2 target nucleic acids are NOT DETECTED. The SARS-CoV-2 RNA is generally detectable in upper respiratoy specimens during the acute phase of infection. The lowest concentration of SARS-CoV-2 viral copies this assay can detect is 131 copies/mL. A negative result does not preclude SARS-Cov-2 infection and should not be used as the sole basis for treatment or other patient management decisions. A negative result may occur with  improper specimen collection/handling, submission of specimen other than nasopharyngeal swab, presence of viral mutation(s) within the areas targeted by this assay, and inadequate number of viral copies (<131 copies/mL). A negative result must be combined with clinical observations, patient history, and epidemiological information. The expected result is Negative. Fact Sheet for Patients:  PinkCheek.be Fact Sheet for Healthcare Providers:  GravelBags.it This test  is not yet ap proved or cleared by the [place] and  has been authorized for detection and/or diagnosis of SARS-CoV-2 by FDA under an Emergency Use Authorization (EUA). This EUA will remain  in effect (meaning this test can be used) for the duration of the COVID-19 declaration under Section 564(b)(1) of the Act, 21 U.S.C. section 360bbb-3(b)(1), unless the authorization is terminated or revoked sooner.    Influenza A by PCR NEGATIVE NEGATIVE Final   Influenza B by PCR NEGATIVE NEGATIVE Final    Comment: (NOTE) The Xpert Xpress SARS-CoV-2/FLU/RSV assay is intended as an aid in  the diagnosis of  influenza from Nasopharyngeal swab specimens and  should not be used as a sole basis for treatment. Nasal washings and  aspirates are unacceptable for Xpert Xpress SARS-CoV-2/FLU/RSV  testing. Fact Sheet for Patients: PinkCheek.be Fact Sheet for Healthcare Providers: GravelBags.it This test is not yet approved or cleared by the [place] FDA and  has been authorized for detection and/or diagnosis of SARS-CoV-2 by  FDA under an Emergency Use Authorization (EUA). This EUA will remain  in effect (meaning this test can be used) for the duration of the  Covid-19 declaration under Section 564(b)(1) of the Act, 21  U.S.C. section 360bbb-3(b)(1), unless the authorization is  terminated or revoked. Performed at [facility], [address], [place], [place] [address]     Labs: BNP (last 3 results) No results for input(s): BNP in the last 8760 hours. Basic Metabolic Panel: Recent Labs  Lab 08/30/19 1031 08/31/19 0505  NA 137 138  K 4.0 3.9  CL 108 108  CO2 24 24  GLUCOSE 168* 110*  BUN 20 12  CREATININE 1.02* 0.84  CALCIUM 9.3 9.8  MG  --  1.9   Liver Function Tests: No results for input(s): AST, ALT, ALKPHOS, BILITOT, PROT, ALBUMIN in the last 168 hours. No results for input(s): LIPASE, AMYLASE in the last 168 hours. No results for input(s): AMMONIA in the last 168 hours. CBC: Recent Labs  Lab 08/30/19 1031 08/31/19 0505  WBC 6.5 6.1  HGB 7.6* 7.4*  HCT 26.9* 25.9*  MCV 82.0 80.7  PLT 283 285   Cardiac Enzymes: No results for input(s): CKTOTAL, CKMB, CKMBINDEX, TROPONINI in the last 168 hours. BNP: Invalid input(s): POCBNP CBG: Recent Labs  Lab 08/30/19 1023  GLUCAP 172*   D-Dimer No results for input(s): DDIMER in the last 72 hours. Hgb A1c No results for input(s): HGBA1C in the last 72 hours. Lipid Profile No results for input(s): CHOL, HDL, LDLCALC, TRIG, CHOLHDL, LDLDIRECT in the last 72  hours. Thyroid function studies No results for input(s): TSH, T4TOTAL, T3FREE, THYROIDAB in the last 72 hours.  Invalid input(s): FREET3 Anemia work up Recent Labs    08/30/19 1031  FERRITIN 2*  TIBC 419  IRON 7*   Urinalysis    Component Value Date/Time   COLORURINE STRAW (A) 08/30/2019 1248   APPEARANCEUR CLEAR 08/30/2019 1248   LABSPEC 1.009 08/30/2019 1248   PHURINE 7.0 08/30/2019 1248   GLUCOSEU NEGATIVE 08/30/2019 1248   HGBUR SMALL (A) 08/30/2019 1248   BILIRUBINUR NEGATIVE 08/30/2019 1248   [place] 08/30/2019 1248   PROTEINUR NEGATIVE 08/30/2019 1248   NITRITE NEGATIVE 08/30/2019 1248   LEUKOCYTESUR NEGATIVE 08/30/2019 1248   Sepsis Labs Invalid input(s): PROCALCITONIN,  WBC,  LACTICIDVEN Microbiology Recent Results (from the past 240 hour(s))  Respiratory Panel by RT PCR (Flu A&B, Covid) - Nasopharyngeal Swab     Status: None  Collection Time: 08/30/19  3:17 PM   Specimen: Nasopharyngeal Swab  Result Value Ref Range Status   SARS Coronavirus 2 by RT PCR NEGATIVE NEGATIVE Final    Comment: (NOTE) SARS-CoV-2 target nucleic acids are NOT DETECTED. The SARS-CoV-2 RNA is generally detectable in upper respiratoy specimens during the acute phase of infection. The lowest concentration of SARS-CoV-2 viral copies this assay can detect is 131 copies/mL. A negative result does not preclude SARS-Cov-2 infection and should not be used as the sole basis for treatment or other patient management decisions. A negative result may occur with  improper specimen collection/handling, submission of specimen other than nasopharyngeal swab, presence of viral mutation(s) within the areas targeted by this assay, and inadequate number of viral copies (<131 copies/mL). A negative result must be combined with clinical observations, patient history, and epidemiological information. The expected result is Negative. Fact Sheet for Patients:   PinkCheek.be Fact Sheet for Healthcare Providers:  GravelBags.it This test is not yet ap proved or cleared by the [place] FDA and  has been authorized for detection and/or diagnosis of SARS-CoV-2 by FDA under an Emergency Use Authorization (EUA). This EUA will remain  in effect (meaning this test can be used) for the duration of the COVID-19 declaration under Section 564(b)(1) of the Act, 21 U.S.C. section 360bbb-3(b)(1), unless the authorization is terminated or revoked sooner.    Influenza A by PCR NEGATIVE NEGATIVE Final   Influenza B by PCR NEGATIVE NEGATIVE Final    Comment: (NOTE) The Xpert Xpress SARS-CoV-2/FLU/RSV assay is intended as an aid in  the diagnosis of influenza from Nasopharyngeal swab specimens and  should not be used as a sole basis for treatment. Nasal washings and  aspirates are unacceptable for Xpert Xpress SARS-CoV-2/FLU/RSV  testing. Fact Sheet for Patients: PinkCheek.be Fact Sheet for Healthcare Providers: GravelBags.it This test is not yet approved or cleared by the [place] FDA and  has been authorized for detection and/or diagnosis of SARS-CoV-2 by  FDA under an Emergency Use Authorization (EUA). This EUA will remain  in effect (meaning this test can be used) for the duration of the  Covid-19 declaration under Section 564(b)(1) of the Act, 21  U.S.C. section 360bbb-3(b)(1), unless the authorization is  terminated or revoked. Performed at [facility], [address] [place], [place] [address]    Time coordinating discharge:    SIGNED:  Irwin Brakeman, MD  [facility] 08/31/2019, 5:41 PM How to contact the [facility] Attending or Consulting provider [place] or covering provider during after hours [place], for this patient?  1. Check the care team in [facility] and look for a) attending/consulting TR[facility] provider listed and b) the  [facility] team listed 2. Log into [email or web address] and use [place] universal password to access. If you do not have the password, please contact the hospital operator. 3. Locate the [facility] provider you are looking for under [facility] and page to a number that you can be directly reached. 4. If you still have difficulty reaching the provider, please page the [facility] (Director on Call) for the Hospitalists listed on amion for assistance.

## 2019-08-31 NOTE — Care Management Obs Status (Signed)
[place] NOTIFICATION   Patient Details  Name: Julia Martinez MRN: [id number] Date of Birth: [birth year]   Medicare Observation Status Notification Given:  Yes    Sherie Don, LCSW 08/31/2019, 5:38 PM

## 2019-08-31 NOTE — Progress Notes (Signed)
PROGRESS NOTE [facility]   Julia Martinez  BCW:[id number]  DOB: 1950/08/18  DOA: 08/30/2019 PCP: Celene Squibb, MD   Brief Admission Hx: 69 y.o. female with medical history significant for hypertension, depression, and prior GI bleed of unidentified source in 2019 who presented to the ED with increasing generalized weakness over 1 week and heme positive stools.  GI has seen her and ordered capsule endoscopy.  MDM/Assessment & Plan:   1. Chronic blood loss anemia-symptomatic anemia had prior endoscopy in 2019 with reported diverticulosis on colonoscopy and small external hemorrhoids.  She has iron deficiency anemia and had been taking oral supplementation.  Her hemoglobin and iron levels remain low.  She is on PPI therapy.  Givens capsule study per GI.  Spoke with GI today we will order IV iron infusion. 2. Essential hypertension-resume home metoprolol and diltiazem. 3. History of depression-not currently on medications.  DVT prophylaxis: SCDs Code Status: Full  Family Communication: husband at bedside updated  Disposition Plan: GI workup, needs IV iron infusion today, further recs per GI    Consultants:  GI (Fields)  Procedures:  Givens Capsule Study   Antimicrobials:  n/a   Subjective: Patient reports that she still feels very weak.  She is anxious to have her capsule study read today.  She denies chest pain and shortness of breath.  She denies diarrhea.  She denies fever and chills.  Objective: Vitals:   08/31/19 0143 08/31/19 0145 08/31/19 0622 08/31/19 0947  BP: (!) 172/64 (!) 163/68 (!) 155/84 (!) 143/78  Pulse: 63 63 65   Resp: 20 20 20    Temp: 98.9 F (37.2 C)  98.6 F (37 C)   TempSrc: Oral  Oral   SpO2: 98% 100% 97%   Weight:      Height:        Intake/Output Summary (Last 24 hours) at 08/31/2019 1217 Last data filed at 08/31/2019 0900 Gross per 24 hour  Intake 1322.6 ml  Output --  Net 1322.6 ml   Filed Weights   08/30/19 0948 08/30/19 1713  08/30/19 1744  Weight: 81.5 kg 83 kg 83 kg     REVIEW OF SYSTEMS  As per history otherwise all reviewed and reported negative  Exam:  General exam: Sitting up in bed awake alert in no apparent distress cooperative. Respiratory system: Clear. No increased work of breathing. Cardiovascular system: S1 & S2 heard. No JVD, murmurs, gallops, clicks or pedal edema. Gastrointestinal system: Abdomen is nondistended, soft and nontender. Normal bowel sounds heard. Central nervous system: Alert and oriented. No focal neurological deficits. Extremities: no CCE.  Data Reviewed: Basic Metabolic Panel: Recent Labs  Lab 08/30/19 1031 08/31/19 0505  NA 137 138  K 4.0 3.9  CL 108 108  CO2 24 24  GLUCOSE 168* 110*  BUN 20 12  CREATININE 1.02* 0.84  CALCIUM 9.3 9.8  MG  --  1.9   Liver Function Tests: No results for input(s): AST, ALT, ALKPHOS, BILITOT, PROT, ALBUMIN in the last 168 hours. No results for input(s): LIPASE, AMYLASE in the last 168 hours. No results for input(s): AMMONIA in the last 168 hours. CBC: Recent Labs  Lab 08/30/19 1031 08/31/19 0505  WBC 6.5 6.1  HGB 7.6* 7.4*  HCT 26.9* 25.9*  MCV 82.0 80.7  PLT 283 285   Cardiac Enzymes: No results for input(s): CKTOTAL, CKMB, CKMBINDEX, TROPONINI in the last 168 hours. CBG (last 3)  Recent Labs    08/30/19 1023  GLUCAP 172*  Recent Results (from the past 240 hour(s))  Respiratory Panel by RT PCR (Flu A&B, Covid) - Nasopharyngeal Swab     Status: None   Collection Time: 08/30/19  3:17 PM   Specimen: Nasopharyngeal Swab  Result Value Ref Range Status   SARS Coronavirus 2 by RT PCR NEGATIVE NEGATIVE Final    Comment: (NOTE) SARS-CoV-2 target nucleic acids are NOT DETECTED. The SARS-CoV-2 RNA is generally detectable in upper respiratoy specimens during the acute phase of infection. The lowest concentration of SARS-CoV-2 viral copies this assay can detect is 131 copies/mL. A negative result does not preclude  SARS-Cov-2 infection and should not be used as the sole basis for treatment or other patient management decisions. A negative result may occur with  improper specimen collection/handling, submission of specimen other than nasopharyngeal swab, presence of viral mutation(s) within the areas targeted by this assay, and inadequate number of viral copies (<131 copies/mL). A negative result must be combined with clinical observations, patient history, and epidemiological information. The expected result is Negative. Fact Sheet for Patients:  PinkCheek.be Fact Sheet for Healthcare Providers:  GravelBags.it This test is not yet ap proved or cleared by the [place] FDA and  has been authorized for detection and/or diagnosis of SARS-CoV-2 by FDA under an Emergency Use Authorization (EUA). This EUA will remain  in effect (meaning this test can be used) for the duration of the COVID-19 declaration under Section 564(b)(1) of the Act, 21 U.S.C. section 360bbb-3(b)(1), unless the authorization is terminated or revoked sooner.    Influenza A by PCR NEGATIVE NEGATIVE Final   Influenza B by PCR NEGATIVE NEGATIVE Final    Comment: (NOTE) The Xpert Xpress SARS-CoV-2/FLU/RSV assay is intended as an aid in  the diagnosis of influenza from Nasopharyngeal swab specimens and  should not be used as a sole basis for treatment. Nasal washings and  aspirates are unacceptable for Xpert Xpress SARS-CoV-2/FLU/RSV  testing. Fact Sheet for Patients: PinkCheek.be Fact Sheet for Healthcare Providers: GravelBags.it This test is not yet approved or cleared by the [place] FDA and  has been authorized for detection and/or diagnosis of SARS-CoV-2 by  FDA under an Emergency Use Authorization (EUA). This EUA will remain  in effect (meaning this test can be used) for the duration of the  Covid-19  declaration under Section 564(b)(1) of the Act, 21  U.S.C. section 360bbb-3(b)(1), unless the authorization is  terminated or revoked. Performed at [facility], [address] [place], [place] [address]      Studies: No results found.   Scheduled Meds: . diltiazem  240 mg Oral Daily  . metoprolol succinate  100 mg Oral Daily  . metoprolol succinate  50 mg Oral Daily  . pantoprazole (PROTONIX) IV  40 mg Intravenous Q12H   Continuous Infusions: . ferumoxytol    . lactated ringers 75 mL/hr at 08/31/19 5537    Principal Problem:   Gastrointestinal hemorrhage Active Problems:   Symptomatic anemia   Acute blood loss anemia   Weakness   Time spent:   Irwin Brakeman, MD [facility] 08/31/2019, 12:17 PM    LOS: 0 days  How to contact the [facility] Attending or Consulting provider [place] or covering provider during after hours [place], for this patient?  1. Check the care team in [facility] and look for a) attending/consulting TR[facility] provider listed and b) the [facility] team listed 2. Log into [email or web address] and use [place]'s universal password to access. If you do not have the password,  please contact the hospital operator. 3. Locate the [facility] provider you are looking for under [facility] and page to a number that you can be directly reached. 4. If you still have difficulty reaching the provider, please page the [facility] (Director on Call) for the Hospitalists listed on amion for assistance.

## 2019-08-31 NOTE — Progress Notes (Signed)
Brief note regarding Given's Capsule. Full report to follow.   Several AVM's and erosions noted throughout the small bowel. Questionable white plaques at times 4:41:27, 5:12:42. Small amount of what looks like fresh blood from unidentified source at the end of the study 8:11:15.   Not clear to me that the study was complete to the cecum.   Will review with Dr. Oneida Alar. Further recommendations to follow.

## 2019-08-31 NOTE — Discharge Instructions (Signed)

## 2019-08-31 NOTE — Progress Notes (Addendum)
    Subjective: Denies lightheadedness or weakness today.  States she needs to go home today to take care of her son.  Denies nausea, vomiting, abdominal pain.  No BM since admission.  1 episode of heartburn/funny feeling in her throat last night.  States she does not know if this is reflux or anxiety.   Objective: Vital signs in last 24 hours: Temp:  [98.4 F (36.9 C)-98.9 F (37.2 C)] 98.6 F (37 C) (04/14 0622) Pulse Rate:  [52-65] 65 (04/14 0622) Resp:  [20] 20 (04/14 0622) BP: (143-172)/(54-84) 143/78 (04/14 0947) SpO2:  [97 %-100 %] 97 % (04/14 0622) Weight:  [83 kg] 83 kg (04/13 1744) Last BM Date: 08/30/19 General:   Alert and oriented, pleasant Head:  Normocephalic and atraumatic. Eyes:  No icterus, sclera clear. Conjuctiva pink.  Abdomen:  Bowel sounds present, soft, non-tender, non-distended. No HSM or hernias noted. No rebound or guarding. No masses appreciated  Msk:  Symmetrical without gross deformities. Normal posture. Extremities:  Without edema. Neurologic:  Alert and  oriented x4;  grossly normal neurologically. Skin:  Warm and dry, intact without significant lesions.  Psych: Normal mood and affect.  Intake/Output from previous day: 04/13 0701 - 04/14 0700 In: 962.6 [I.V.:962.6] Out: -  Intake/Output this shift: Total I/O In: 360 [P.O.:360] Out: -   Lab Results: Recent Labs    08/30/19 1031 08/31/19 0505  WBC 6.5 6.1  HGB 7.6* 7.4*  HCT 26.9* 25.9*  PLT 283 285   BMET Recent Labs    08/30/19 1031 08/31/19 0505  NA 137 138  K 4.0 3.9  CL 108 108  CO2 24 24  GLUCOSE 168* 110*  BUN 20 12  CREATININE 1.02* 0.84  CALCIUM 9.3 9.8    Assessment: 69 year old female with history of depression, HTN, and GI bleed in 2019 undergoing EGD/TCS without identified source to presented to the emergency room today for 1 week of increased generalized weakness and an episode of dark/burgancy stool yesterday morning found to have hemoglobin of 7.6, heme  positive stool, ferritin 2, serum iron 7, percent saturation 2%.  Admitted to chronic intermittent GERD and nausea not on a PPI.  No NSAIDs.  Given's Capsule completed 08/30/19 with small bowel erosions, AVMs, and ulcerations in the distal small bowel with oozing. Patient has not passed capsule and study did not appear to be complete. We will need to follow-up on this tomorrow. No overt GI bleeding since admission.  Hemoglobin is stable today at 7.4. Discussed findings with Dr. Oneida Alar who recommended CTA today for further evaluation.   Interestingly, TCS in 2009 with abnormal IC valve and TI with nonspecific ulcerations.  CTE with chronic inflammatory change of the distal TI with concerns for SB Crohn's. No medications started as she was clinically asymptomatic. With small bowel ulcerations and prior findings in 2009, Crohn's disease is in the differential.  Further recommendations to follow CTA today.  Plan: CTA today. Continue IV Protonix 40 mg twice daily. Feraheme today. Continue to monitor H&H and transfuse as necessary. Monitor for overt GI bleeding. May need abdominal X-ray tomorrow to follow-up on Givens capsule location.  Further recommendations to follow CTA.    LOS: 0 days    08/31/2019, 12:50 PM   Aliene Altes, [facility]

## 2019-08-31 NOTE — Care Management CC44 (Signed)
Condition Code 44 Documentation Completed  Patient Details  Name: INAYA GILLHAM MRN: [id number] Date of Birth: [birth year]   Condition Code 44 given:  Yes(Verbal consent given to CSW to sign) Patient signature on Condition Code 44 notice:  Yes Documentation of 2 MD's agreement:  Yes Code 44 added to claim:  Yes    Sherie Don, LCSW 08/31/2019, 5:38 PM

## 2019-09-01 LAB — TYPE AND SCREEN
ABO/RH(D): A POS
Antibody Screen: NEGATIVE
Unit division: 0

## 2019-09-01 LAB — BPAM RBC
Blood Product Expiration Date: 202105032359
ISSUE DATE / TIME: 202104141631
Unit Type and Rh: 6200

## 2019-09-02 DIAGNOSIS — R001 Bradycardia, unspecified: Secondary | ICD-10-CM | POA: Diagnosis not present

## 2019-09-02 DIAGNOSIS — D509 Iron deficiency anemia, unspecified: Secondary | ICD-10-CM | POA: Diagnosis not present

## 2019-09-02 DIAGNOSIS — M545 Low back pain: Secondary | ICD-10-CM | POA: Diagnosis not present

## 2019-09-02 DIAGNOSIS — Z0189 Encounter for other specified special examinations: Secondary | ICD-10-CM | POA: Diagnosis not present

## 2019-09-02 DIAGNOSIS — I10 Essential (primary) hypertension: Secondary | ICD-10-CM | POA: Diagnosis not present

## 2019-09-06 ENCOUNTER — Encounter: Payer: Self-pay | Admitting: Student

## 2019-09-06 ENCOUNTER — Telehealth: Payer: Self-pay | Admitting: Gastroenterology

## 2019-09-06 ENCOUNTER — Other Ambulatory Visit: Payer: Self-pay

## 2019-09-06 ENCOUNTER — Ambulatory Visit (INDEPENDENT_AMBULATORY_CARE_PROVIDER_SITE_OTHER): Payer: Medicare Other | Admitting: Student

## 2019-09-06 VITALS — BP 138/62 | HR 61 | Temp 98.3°F | Ht 64.0 in | Wt 177.0 lb

## 2019-09-06 DIAGNOSIS — D649 Anemia, unspecified: Secondary | ICD-10-CM | POA: Diagnosis not present

## 2019-09-06 DIAGNOSIS — I10 Essential (primary) hypertension: Secondary | ICD-10-CM | POA: Diagnosis not present

## 2019-09-06 DIAGNOSIS — K289 Gastrojejunal ulcer, unspecified as acute or chronic, without hemorrhage or perforation: Secondary | ICD-10-CM

## 2019-09-06 DIAGNOSIS — I471 Supraventricular tachycardia: Secondary | ICD-10-CM | POA: Diagnosis not present

## 2019-09-06 DIAGNOSIS — D508 Other iron deficiency anemias: Secondary | ICD-10-CM

## 2019-09-06 NOTE — Patient Instructions (Signed)
Medication Instructions:   Decrease Toprol-XL to 148m daily.   Labwork:  CBC this week if not checked by Hematology.   Testing/Procedures:  None  Follow-Up:  With Dr. MDomenic Politein 6 months.   Any Other Special Instructions Will Be Listed Below (If Applicable).     If you need a refill on your cardiac medications before your next appointment, please call your pharmacy.

## 2019-09-06 NOTE — Telephone Encounter (Signed)
PT NEEDS:  1. SUBMIT BLOOD FOR PROMETHEUS IBD PANEL. 2. REFERRAL TO HEMATOLOGY [facility], DV:IPNY DEFICIENCY ANEMIA. 3. REFERRAL TO [facility], Dx: IRON DEFICIENCY ANEMIA/JEJUNAL ULCERS/EVLAUATE FOR BALLOON ENTEROSCOPY 4. IF SHE DOES NOT SEE THE CAPSULE PASS, SHE WILL NEED A KUB @ MAY 13 TO ASSES FOR CAPSULE RETENTION.

## 2019-09-06 NOTE — Progress Notes (Signed)
Cardiology Office Note    Date:  09/06/2019   ID:  Julia Martinez, DOB Jan 21, 1951, MRN [id number]  PCP:  Julia Squibb, MD  Cardiologist: Julia Lesches, MD    Chief Complaint  Patient presents with  . Hospitalization Follow-up    History of Present Illness:    Julia Martinez is a 69 y.o. female with past medical history of HTN, pSVT and GERD who presents to the office today for hospital follow-up.  She most recently had a telehealth visit with Dr. Domenic Martinez in 06/2019 and denied any recent chest pain or palpitations at that time. Sleep study demonstrated mild sleep apnea but she did not meet requirements for CPAP.  Was continued on her current medication regimen at that time including Cardizem CD 240 mg daily, HCTZ 25 mg daily and Toprol-XL 135m daily.   In the interim, she presented to [facility] for evaluation of progressive weakness. She wad found to be anemic with Hgb at 7.6 and GI was consulted for further evaluation. Underwent a capsule study which showed several AVM's and erosions throughout the small bowel. CT abdomen showed abdominal atherosclerosis without mesenteric or renal vascular occlusions. Was noted to have diverticulosis without acute inflammatory processes and cholelithiasis. She was started on iron supplementation and Protonix with outpatient Hematology and Oncology follow-up recommended.   In talking with the patient today, she reports being under increased stress as her son who has known Prader-Willi syndrome is starting to have increased behavioral issues and they are also having difficulty managing his glucose levels.  Since her hospital stay, she has continued to experience weakness and is scheduled to see Hematology later this week. She previously required iron transfusions but did not receive any over the past several months given the pandemic. Intolerant to PO iron in the past. She denies any recent melena, hematochezia or hematuria.  During the  hospitalization, she was informed she was bradycardic at times, therefore she did self-reduce her Toprol-XL from 150 mg daily to 100 mg daily.  Denies any significant change in palpitations since doing so. No recent orthopnea, PND or lower extremity edema.   Past Medical History:  Diagnosis Date  . Depression   . Essential hypertension   . OSA (obstructive sleep apnea)    Mild by sleep study 2019  . PSVT (paroxysmal supraventricular tachycardia) ([place]     Past Surgical History:  Procedure Laterality Date  . CESAREAN SECTION    . COLONOSCOPY  2009   single external hemorrhoid tag, otherwise normal rectum. Numerous left-sided diverticula, abnormal IC valve and TI suggestive of Crohn's disease s/p biopsy. Biopsies with non-specific ulcerations. CTE then revealing chronic inflammatory change of distal TI, concerning for SB Crohn's. Did not start therapy due to absence of clinical symptoms and [facility]  . COLONOSCOPY N/A 07/21/2017   Procedure: COLONOSCOPY;  Surgeon: FDanie Binder MD;  Location: [facility];  Service: Endoscopy;  Laterality: N/A;  . ESOPHAGOGASTRODUODENOSCOPY N/A 07/20/2017   Procedure: ESOPHAGOGASTRODUODENOSCOPY (EGD);  Surgeon: RDaneil Dolin MD;  Location: [facility];  Service: Endoscopy;  Laterality: N/A;  . GIVENS CAPSULE STUDY N/A 08/30/2019   Procedure: GIVENS CAPSULE STUDY;  Surgeon: FDanie Binder MD;  Location: [facility];  Service: Endoscopy;  Laterality: N/A;  Place Givens Capsule at 5:00 PM  . WISDOM TOOTH EXTRACTION      Current Medications: Outpatient Medications Prior to Visit  Medication Sig Dispense Refill  . diltiazem (DILACOR XR) 240  MG 24 hr capsule Take 1 capsule (240 mg total) by mouth daily. 90 capsule 3  . ferrous sulfate 325 (65 FE) MG EC tablet Take 325 mg by mouth daily.    . metoprolol succinate (TOPROL-XL) 100 MG 24 hr tablet Take 1 tablet (100 mg total) by mouth daily. Take with or immediately following a meal.  90 tablet 3  . pantoprazole (PROTONIX) 40 MG tablet Take 1 tablet (40 mg total) by mouth daily. 30 tablet 1  . metoprolol succinate (TOPROL-XL) 50 MG 24 hr tablet TAKE ONE TABLET (50MG) BY MOUTH ALONG WITH METOPROLOL 100MG FOR A TOTAL OF 150MG ONCE DAILY 90 tablet 3   No facility-administered medications prior to visit.     Allergies:   Penicillins   Social History   Socioeconomic History  . Marital status: Married    Spouse name: Not on file  . Number of children: Not on file  . Years of education: Not on file  . Highest education level: Not on file  Occupational History  . Occupation: part-time for [employer]  Tobacco Use  . Smoking status: Never Smoker  . Smokeless tobacco: Never Used  Substance and Sexual Activity  . Alcohol use: No  . Drug use: No  . Sexual activity: Never  Other Topics Concern  . Not on file  Social History Narrative  . Not on file   Social Determinants of Health   Financial Resource Strain:   . Difficulty of Paying Living Expenses:   Food Insecurity:   . Worried About Charity fundraiser in the Last Year:   . Arboriculturist in the Last Year:   Transportation Needs:   . Film/video editor (Medical):   Julia Martinez Lack of Transportation (Non-Medical):   Physical Activity:   . Days of Exercise per Week:   . Minutes of Exercise per Session:   Stress:   . Feeling of Stress :   Social Connections:   . Frequency of Communication with Friends and Family:   . Frequency of Social Gatherings with Friends and Family:   . Attends Religious Services:   . Active Member of Clubs or Organizations:   . Attends Archivist Meetings:   Julia Martinez Marital Status:      Family History:  The patient's family history includes Depression in her mother; Ulcers in her mother.   Review of Systems:   Please see the history of present illness.     General:  No chills, fever, night sweats or weight changes. Positive for generalized weakness.  Cardiovascular:  No chest pain,  dyspnea on exertion, edema, orthopnea, palpitations, paroxysmal nocturnal dyspnea. Dermatological: No rash, lesions/masses Respiratory: No cough, dyspnea Urologic: No hematuria, dysuria Abdominal:   No nausea, vomiting, diarrhea, bright red blood per rectum, melena, or hematemesis Neurologic:  No visual changes, changes in mental status.  All other systems reviewed and are otherwise negative except as noted above.   Physical Exam:    VS:  BP 138/62   Pulse 61   Temp 98.3 F (36.8 C)   Ht 5' 4"  (1.626 m)   Wt 177 lb (80.3 kg)   SpO2 98%   BMI 30.38 kg/m    General: Well developed, well nourished,female appearing in no acute distress. Head: Normocephalic, atraumatic, sclera non-icteric.  Neck: No carotid bruits. JVD not elevated.  Lungs: Respirations regular and unlabored, without wheezes or rales.  Heart: Regular rate and rhythm. No S3 or S4.  No murmur, no rubs, or gallops  appreciated. Abdomen: Soft, non-tender, non-distended. No obvious abdominal masses. Msk:  Strength and tone appear normal for age. No obvious joint deformities or effusions. Extremities: No clubbing or cyanosis. No lower extremity edema.  Distal pedal pulses are 2+ bilaterally. Neuro: Alert and oriented X 3. Moves all extremities spontaneously. No focal deficits noted. Psych:  Responds to questions appropriately with a normal affect. Skin: No rashes or lesions noted  Wt Readings from Last 3 Encounters:  09/06/19 177 lb (80.3 kg)  08/30/19 182 lb 15.7 oz (83 kg)  06/24/19 180 lb (81.6 kg)    Studies/Labs Reviewed:   EKG:  EKG is not ordered today. EKG from 08/30/2019 is reviewed which shows sinus bradycardia, HR 56 with known RBBB.   Recent Labs: 08/31/2019: BUN 12; Creatinine, Ser 0.84; Hemoglobin 7.4; Magnesium 1.9; Platelets 285; Potassium 3.9; Sodium 138   Lipid Panel No results found for: CHOL, TRIG, HDL, CHOLHDL, VLDL, LDLCALC, LDLDIRECT  Additional studies/ records that were reviewed today  include:   Echocardiogram: 09/2017 Study Conclusions   - Left ventricle: The cavity size was normal. Wall thickness was  normal. Systolic function was normal. The estimated ejection  fraction was in the range of 60% to 65%. Wall motion was normal;  there were no regional wall motion abnormalities. Doppler  parameters are consistent with abnormal left ventricular  relaxation (grade 1 diastolic dysfunction). Doppler parameters  are consistent with high ventricular filling pressure.  - Aortic valve: Mildly calcified annulus. Trileaflet; mildly  thickened leaflets. Valve area (VTI): 1.81 cm^2. Valve area  (Vmax): 1.53 cm^2. Valve area (Vmean): 1.54 cm^2.  - Mitral valve: Mildly calcified annulus. Normal thickness leaflets  . There was mild regurgitation.  - Left atrium: The atrium was mildly dilated.  - Right ventricle: The cavity size was mildly to moderately  dilated.  - Right atrium: The atrium was mildly dilated.  - Atrial septum: No defect or patent foramen ovale was identified.  - Pulmonary arteries: Systolic pressure was moderately increased.  PA peak pressure: 50 mm Hg (S).  - Technically adequate study.   Assessment:    1. PSVT (paroxysmal supraventricular tachycardia) ([place])   2. Essential hypertension   3. Anemia, unspecified type      Plan:   In order of problems listed above:  1. pSVT - she denies any recent palpitations and she does monitor her caffeine intake. Given bradycardia during admission, she did self reduce Toprol-XL from 150 mg daily to 100 mg daily and this seems reasonable as heart rate has improved into the 60's and she denies any increased palpitations. Will continue current medication regimen with Cardizem CD 240 mg daily and Toprol-XL 100 mg daily.  2. HTN - BP was initially recorded as being elevated at 154/76, rechecked and improved to 138/62. I did encourage her to check readings at home. She is under increased stress at home  and this could be playing a role as well. Continue current regimen as outlined above.  3. Anemia - Recently admitted for evaluation of worsening weakness and was found to be anemic. She does have follow-up with Hematology scheduled this week and follow-up with GI scheduled next week. I did encourage her to have a follow-up CBC this week if not obtained by Hematology.   Medication Adjustments/Labs and Tests Ordered: Current medicines are reviewed at length with the patient today.  Concerns regarding medicines are outlined above.  Medication changes, Labs and Tests ordered today are listed in the Patient Instructions below. Patient Instructions  Medication  Instructions:   Decrease Toprol-XL to 162m daily.   Labwork:  CBC this week if not checked by Hematology.   Testing/Procedures:  None  Follow-Up:  With Dr. MDomenic Politein 6 months.   Any Other Special Instructions Will Be Listed Below (If Applicable).   If you need a refill on your cardiac medications before your next appointment, please call your pharmacy.    Signed, BErma Heritage PA-C  09/06/2019 4:59 PM    [address] [address]Phone: [phone]: [phone]

## 2019-09-07 ENCOUNTER — Encounter (HOSPITAL_COMMUNITY): Payer: Self-pay

## 2019-09-07 NOTE — Addendum Note (Signed)
Addended by: Cheron Every on: 09/07/2019 08:22 AM   Modules accepted: Orders

## 2019-09-07 NOTE — Telephone Encounter (Signed)
Referrals sent to [facility] and [facility].

## 2019-09-07 NOTE — Telephone Encounter (Signed)
Working on [employer]

## 2019-09-08 ENCOUNTER — Other Ambulatory Visit: Payer: Self-pay

## 2019-09-08 ENCOUNTER — Encounter (HOSPITAL_COMMUNITY): Payer: Self-pay | Admitting: Hematology

## 2019-09-08 ENCOUNTER — Inpatient Hospital Stay (HOSPITAL_COMMUNITY): Payer: Medicare Other | Attending: Hematology | Admitting: Hematology

## 2019-09-08 ENCOUNTER — Encounter (HOSPITAL_COMMUNITY): Payer: Self-pay | Admitting: Surgery

## 2019-09-08 ENCOUNTER — Encounter: Payer: Self-pay | Admitting: Gastroenterology

## 2019-09-08 ENCOUNTER — Inpatient Hospital Stay (HOSPITAL_COMMUNITY): Payer: Medicare Other

## 2019-09-08 VITALS — BP 161/84 | HR 59 | Temp 97.3°F | Resp 18 | Ht 60.0 in | Wt 179.6 lb

## 2019-09-08 DIAGNOSIS — D649 Anemia, unspecified: Secondary | ICD-10-CM | POA: Insufficient documentation

## 2019-09-08 DIAGNOSIS — D500 Iron deficiency anemia secondary to blood loss (chronic): Secondary | ICD-10-CM

## 2019-09-08 DIAGNOSIS — K529 Noninfective gastroenteritis and colitis, unspecified: Secondary | ICD-10-CM | POA: Insufficient documentation

## 2019-09-08 LAB — CBC WITH DIFFERENTIAL/PLATELET
Abs Immature Granulocytes: 0.03 10*3/uL (ref 0.00–0.07)
Basophils Absolute: 0.1 10*3/uL (ref 0.0–0.1)
Basophils Relative: 1 %
Eosinophils Absolute: 0.1 10*3/uL (ref 0.0–0.5)
Eosinophils Relative: 1 %
HCT: 36.9 % (ref 36.0–46.0)
Hemoglobin: 10.7 g/dL — ABNORMAL LOW (ref 12.0–15.0)
Immature Granulocytes: 0 %
Lymphocytes Relative: 15 %
Lymphs Abs: 1.2 10*3/uL (ref 0.7–4.0)
MCH: 24.8 pg — ABNORMAL LOW (ref 26.0–34.0)
MCHC: 29 g/dL — ABNORMAL LOW (ref 30.0–36.0)
MCV: 85.4 fL (ref 80.0–100.0)
Monocytes Absolute: 0.6 10*3/uL (ref 0.1–1.0)
Monocytes Relative: 8 %
Neutro Abs: 6.1 10*3/uL (ref 1.7–7.7)
Neutrophils Relative %: 75 %
Platelets: 301 10*3/uL (ref 150–400)
RBC: 4.32 MIL/uL (ref 3.87–5.11)
RDW: 19.9 % — ABNORMAL HIGH (ref 11.5–15.5)
WBC: 8.1 10*3/uL (ref 4.0–10.5)
nRBC: 0 % (ref 0.0–0.2)

## 2019-09-08 LAB — RETICULOCYTES
Immature Retic Fract: 15.5 % (ref 2.3–15.9)
RBC.: 4.3 MIL/uL (ref 3.87–5.11)
Retic Count, Absolute: 227 10*3/uL — ABNORMAL HIGH (ref 19.0–186.0)
Retic Ct Pct: 5.1 % — ABNORMAL HIGH (ref 0.4–3.1)

## 2019-09-08 LAB — LACTATE DEHYDROGENASE: LDH: 129 U/L (ref 98–192)

## 2019-09-08 LAB — VITAMIN B12: Vitamin B-12: 172 pg/mL — ABNORMAL LOW (ref 180–914)

## 2019-09-08 LAB — FOLATE: Folate: 9.3 ng/mL (ref >5.9)

## 2019-09-08 NOTE — Progress Notes (Signed)
CONSULT NOTE  Patient Care Team: Celene Squibb, MD as PCP - General (Internal Medicine) Satira Sark, MD as PCP - Cardiology (Cardiology)  CHIEF COMPLAINTS/PURPOSE OF CONSULTATION:  Normocytic anemia.  HISTORY OF PRESENTING ILLNESS:  Julia Martinez 69 y.o. female is seen in consultation today for further work-up and management of normocytic anemia.  She recently presented to the hospital on 08/30/2019 with severe weakness and also noticed to 1 dark bowel movement.  She was found to have hemoglobin of 7.6 with MCV of 8.2.  Creatinine was 1.02.  Ferritin was found to be 2 with percent saturation of 2.  She underwent a capsule study which showed small bowel AVMs, erosions, distal small bowel ulcerations, small amounts of fresh blood.  She did receive 1 unit of PRBC and 1 unit of Feraheme.  Denies any chest pain on exertion, shortness of breath on minimal exertion, presyncopal episodes or palpitations.  No hematuria reported.  Denies any over-the-counter NSAID ingestion.  She is not on any antiplatelet agents.  Last colonoscopy was on 07/21/2017 which showed multiple ileal diverticula, moderate diverticulosis in the entire colon, significant looping of the scope left colon, small external hemorrhoids.  EGD on 07/20/2017 shows normal esophagus, small hiatal hernia, normal duodenal bulb, second portion of the duodenum and third portion of the duodenum.  She has tried taking iron tablet for 3 months last year and stopped it because of severe nausea.  Does not report any pica.  She reportedly lost 30 pounds during last year trying to lose weight.  Denies any fevers or night sweats.  She reports that she is under a lot of stress caring for her special needs son.  Her daughter Threasa Beards works in the endoscopy suite as an Therapist, sports.  She worked in Sports coach office prior to retirement.  She was never smoker.  No family history of transfusions.  Maternal aunt had breast cancer in her 59s.  No other malignancies  reported.  Patient does not have any history of Crohn's or symptoms of Crohn's disease.    MEDICAL HISTORY:  Past Medical History:  Diagnosis Date  . Anemia   . Depression   . Essential hypertension   . OSA (obstructive sleep apnea)    Mild by sleep study 2019  . PSVT (paroxysmal supraventricular tachycardia) ([place])   . Ulcer of small intestine     SURGICAL HISTORY: Past Surgical History:  Procedure Laterality Date  . CESAREAN SECTION    . COLONOSCOPY  2009   single external hemorrhoid tag, otherwise normal rectum. Numerous left-sided diverticula, abnormal IC valve and TI suggestive of Crohn's disease s/p biopsy. Biopsies with non-specific ulcerations. CTE then revealing chronic inflammatory change of distal TI, concerning for SB Crohn's. Did not start therapy due to absence of clinical symptoms and recommended [facility] evaluation.  . COLONOSCOPY N/A 07/21/2017   Procedure: COLONOSCOPY;  Surgeon: Danie Binder, MD;  [facility];  Service: Endoscopy;  Laterality: N/A;  . ESOPHAGOGASTRODUODENOSCOPY N/A 07/20/2017   Procedure: ESOPHAGOGASTRODUODENOSCOPY (EGD);  Surgeon: Daneil Dolin, MD;  Location: [facility];  Service: Endoscopy;  Laterality: N/A;  . GIVENS CAPSULE STUDY N/A 08/30/2019   Procedure: GIVENS CAPSULE STUDY;  Surgeon: Danie Binder, MD;  Location: [facility];  Service: Endoscopy;  Laterality: N/A;  Place Givens Capsule at 5:00 PM  . WISDOM TOOTH EXTRACTION      SOCIAL HISTORY: Social History   Socioeconomic History  . Marital status: Married    Spouse  name: Not on file  . Number of children: 3  . Years of education: Not on file  . Highest education level: Not on file  Occupational History  . Occupation: paraprofessional  Tobacco Use  . Smoking status: Never Smoker  . Smokeless tobacco: Never Used  Substance and Sexual Activity  . Alcohol use: No  . Drug use: No  . Sexual activity: Not Currently  Other Topics Concern  . Not on file  Social  History Narrative  . Not on file   Social Determinants of Health   Financial Resource Strain:   . Difficulty of Paying Living Expenses:   Food Insecurity:   . Worried About Charity fundraiser in the Last Year:   . Arboriculturist in the Last Year:   Transportation Needs:   . Film/video editor (Medical):   Marland Kitchen Lack of Transportation (Non-Medical):   Physical Activity:   . Days of Exercise per Week:   . Minutes of Exercise per Session:   Stress:   . Feeling of Stress :   Social Connections:   . Frequency of Communication with Friends and Family:   . Frequency of Social Gatherings with Friends and Family:   . Attends Religious Services:   . Active Member of Clubs or Organizations:   . Attends Archivist Meetings:   Marland Kitchen Marital Status:   Intimate Partner Violence:   . Fear of Current or Ex-Partner:   . Emotionally Abused:   Marland Kitchen Physically Abused:   . Sexually Abused:     FAMILY HISTORY: Family History  Problem Relation Age of Onset  . Depression Mother   . Ulcers Mother   . Emphysema Father   . Prader-Willi syndrome Son   . Colon cancer Neg Hx   . Colon polyps Neg Hx     ALLERGIES:  is allergic to penicillins.  MEDICATIONS:  Current Outpatient Medications  Medication Sig Dispense Refill  . diltiazem (DILACOR XR) 240 MG 24 hr capsule Take 1 capsule (240 mg total) by mouth daily. 90 capsule 3  . ferrous sulfate 325 (65 FE) MG EC tablet Take 325 mg by mouth as needed.     . metoprolol succinate (TOPROL-XL) 100 MG 24 hr tablet Take 1 tablet (100 mg total) by mouth daily. Take with or immediately following a meal. 90 tablet 3  . pantoprazole (PROTONIX) 40 MG tablet Take 1 tablet (40 mg total) by mouth daily. 30 tablet 1   No current facility-administered medications for this visit.    REVIEW OF SYSTEMS:   Constitutional: Denies fevers, chills or abnormal night sweats Eyes: Denies blurriness of vision, double vision or watery eyes Ears, nose, mouth, throat,  and face: Denies mucositis or sore throat Respiratory: Denies cough, dyspnea or wheezes Cardiovascular: Denies chest discomfort or lower extremity swelling.  Positive for palpitations. Gastrointestinal:  Denies nausea, heartburn or change in bowel habits Skin: Denies abnormal skin rashes Lymphatics: Denies new lymphadenopathy or easy bruising Neurological:Denies numbness, tingling or new weaknesses Behavioral/Psych: Mood is stable, no new changes.  Positive for anxiety and depression. All other systems were reviewed with the patient and are negative.  PHYSICAL EXAMINATION: ECOG PERFORMANCE STATUS: 1 - Symptomatic but completely ambulatory  Vitals:   09/08/19 1257  BP: (!) 161/84  Pulse: (!) 59  Resp: 18  Temp: (!) 97.3 F (36.3 C)  SpO2: 97%   Filed Weights   09/08/19 1257  Weight: 179 lb 9.6 oz (81.5 kg)    GENERAL:alert, no  distress and comfortable SKIN: skin color, texture, turgor are normal, no rashes or significant lesions EYES: normal, conjunctiva are pink and non-injected, sclera clear OROPHARYNX:no exudate, no erythema and lips, buccal mucosa, and tongue normal  NECK: supple, thyroid normal size, non-tender, without nodularity LYMPH:  no palpable lymphadenopathy in the cervical, axillary or inguinal LUNGS: clear to auscultation and percussion with normal breathing effort HEART: regular rate & rhythm and no murmurs and no lower extremity edema ABDOMEN:abdomen soft, non-tender and normal bowel sounds Musculoskeletal:no cyanosis of digits and no clubbing  PSYCH: alert & oriented x 3 with fluent speech NEURO: no focal motor/sensory deficits  LABORATORY DATA:  I have reviewed the data as listed Recent Results (from the past 2160 hour(s))  CBG monitoring, ED     Status: Abnormal   Collection Time: 08/30/19 10:23 AM  Result Value Ref Range   Glucose-Capillary 172 (H) 70 - 99 mg/dL    Comment: Glucose reference range applies only to samples taken after fasting for at  least 8 hours.  Basic metabolic panel     Status: Abnormal   Collection Time: 08/30/19 10:31 AM  Result Value Ref Range   Sodium 137 135 - 145 mmol/L   Potassium 4.0 3.5 - 5.1 mmol/L   Chloride 108 98 - 111 mmol/L   CO2 24 22 - 32 mmol/L   Glucose, Bld 168 (H) 70 - 99 mg/dL    Comment: Glucose reference range applies only to samples taken after fasting for at least 8 hours.   BUN 20 8 - 23 mg/dL   Creatinine, Ser 1.02 (H) 0.44 - 1.00 mg/dL   Calcium 9.3 8.9 - 10.3 mg/dL   GFR calc non Af Amer 57 (L) >60 mL/min   GFR calc Af Amer >60 >60 mL/min   Anion gap 5 5 - 15    Comment: Performed at [facility], [address], [place], [place] [address]  CBC     Status: Abnormal   Collection Time: 08/30/19 10:31 AM  Result Value Ref Range   WBC 6.5 4.0 - 10.5 K/uL   RBC 3.28 (L) 3.87 - 5.11 MIL/uL   Hemoglobin 7.6 (L) 12.0 - 15.0 g/dL   HCT 26.9 (L) 36.0 - 46.0 %   MCV 82.0 80.0 - 100.0 fL   MCH 23.2 (L) 26.0 - 34.0 pg   MCHC 28.3 (L) 30.0 - 36.0 g/dL   RDW 15.3 11.5 - 15.5 %   Platelets 283 150 - 400 K/uL   nRBC 0.0 0.0 - 0.2 %    Comment: Performed at [facility], [address], [place], [place] [address]  Troponin I (High Sensitivity)     Status: None   Collection Time: 08/30/19 10:31 AM  Result Value Ref Range   Troponin I (High Sensitivity) 3 <18 ng/L    Comment: (NOTE) Elevated high sensitivity troponin I (hsTnI) values and significant  changes across serial measurements may suggest ACS but many other  chronic and acute conditions are known to elevate hsTnI results.  Refer to the "Links" section for chest pain algorithms and additional  guidance. Performed at [facility], [address], [place], [place] [address]   Iron and TIBC     Status: Abnormal   Collection Time: 08/30/19 10:31 AM  Result Value Ref Range   Iron 7 (L) 28 - 170 ug/dL   TIBC 419 250 - 450 ug/dL   Saturation Ratios 2 (L) 10.4 - 31.8 %   UIBC 412 ug/dL    Comment:  Performed at [facility], [address] [place], [place] [address]  Ferritin     Status: Abnormal   Collection Time: 08/30/19 10:31 AM  Result Value Ref Range   Ferritin 2 (L) 11 - 307 ng/mL    Comment: Performed at [facility], [address], [place], [place] [address]  POC occult blood, ED RN will collect     Status: Abnormal   Collection Time: 08/30/19 11:31 AM  Result Value Ref Range   Fecal Occult Bld POSITIVE (A) NEGATIVE  Sample to Blood Bank     Status: None   Collection Time: 08/30/19 12:19 PM  Result Value Ref Range   Blood Bank Specimen SAMPLE AVAILABLE FOR TESTING    Sample Expiration      09/02/2019,2359 Performed at [facility], [address], [place], [place] [address]   Troponin I (High Sensitivity)     Status: None   Collection Time: 08/30/19 12:19 PM  Result Value Ref Range   Troponin I (High Sensitivity) 3 <18 ng/L    Comment: (NOTE) Elevated high sensitivity troponin I (hsTnI) values and significant  changes across serial measurements may suggest ACS but many other  chronic and acute conditions are known to elevate hsTnI results.  Refer to the "Links" section for chest pain algorithms and additional  guidance. Performed at [facility], [address] [place], [place] [address]   Urinalysis, Routine w reflex microscopic     Status: Abnormal   Collection Time: 08/30/19 12:48 PM  Result Value Ref Range   Color, Urine STRAW (A) YELLOW   APPearance CLEAR CLEAR   Specific Gravity, Urine 1.009 1.005 - 1.030   pH 7.0 5.0 - 8.0   Glucose, UA NEGATIVE NEGATIVE mg/dL   Hgb urine dipstick SMALL (A) NEGATIVE   Bilirubin Urine NEGATIVE NEGATIVE   Ketones, ur NEGATIVE NEGATIVE mg/dL   Protein, ur NEGATIVE NEGATIVE mg/dL   Nitrite NEGATIVE NEGATIVE   Leukocytes,Ua NEGATIVE NEGATIVE   RBC / HPF 0-5 0 - 5 RBC/hpf   WBC, UA 0-5 0 - 5 WBC/hpf   Bacteria, UA RARE (A) NONE SEEN   Squamous Epithelial / LPF 0-5 0 - 5   Mucus PRESENT     Comment: Performed at [facility], [address] [place],  [place] [address]  Respiratory Panel by RT PCR (Flu A&B, Covid) - Nasopharyngeal Swab     Status: None   Collection Time: 08/30/19  3:17 PM   Specimen: Nasopharyngeal Swab  Result Value Ref Range   SARS Coronavirus 2 by RT PCR NEGATIVE NEGATIVE    Comment: (NOTE) SARS-CoV-2 target nucleic acids are NOT DETECTED. The SARS-CoV-2 RNA is generally detectable in upper respiratoy specimens during the acute phase of infection. The lowest concentration of SARS-CoV-2 viral copies this assay can detect is 131 copies/mL. A negative result does not preclude SARS-Cov-2 infection and should not be used as the sole basis for treatment or other patient management decisions. A negative result may occur with  improper specimen collection/handling, submission of specimen other than nasopharyngeal swab, presence of viral mutation(s) within the areas targeted by this assay, and inadequate number of viral copies (<131 copies/mL). A negative result must be combined with clinical observations, patient history, and epidemiological information. The expected result is Negative. Fact Sheet for Patients:  PinkCheek.be Fact Sheet for Healthcare Providers:  GravelBags.it This test is not yet ap proved or cleared by the [place] FDA and  has been authorized for detection and/or diagnosis of SARS-CoV-2 by  FDA under an Emergency Use Authorization (EUA). This EUA will remain  in effect (meaning this test can be used) for the duration of the COVID-19 declaration under Section 564(b)(1) of the Act, 21 U.S.C. section 360bbb-3(b)(1), unless the authorization is terminated or revoked sooner.    Influenza A by PCR NEGATIVE NEGATIVE   Influenza B by PCR NEGATIVE NEGATIVE    Comment: (NOTE) The Xpert Xpress SARS-CoV-2/FLU/RSV assay is intended as an aid in  the diagnosis of influenza from Nasopharyngeal swab specimens and  should not be used as a sole basis for  treatment. Nasal washings and  aspirates are unacceptable for Xpert Xpress SARS-CoV-2/FLU/RSV  testing. Fact Sheet for Patients: PinkCheek.be Fact Sheet for Healthcare Providers: GravelBags.it This test is not yet approved or cleared by the [place] FDA and  has been authorized for detection and/or diagnosis of SARS-CoV-2 by  FDA under an Emergency Use Authorization (EUA). This EUA will remain  in effect (meaning this test can be used) for the duration of the  Covid-19 declaration under Section 564(b)(1) of the Act, 21  U.S.C. section 360bbb-3(b)(1), unless the authorization is  terminated or revoked. Performed at [facility], [address], [place], [place] [address]   HIV Antibody (routine testing w rflx)     Status: None   Collection Time: 08/30/19  3:55 PM  Result Value Ref Range   HIV Screen 4th Generation wRfx NON REACTIVE NON REACTIVE    Comment: Performed at [facility], [place] [address] [place], [place] [address]  Magnesium     Status: None   Collection Time: 08/31/19  5:05 AM  Result Value Ref Range   Magnesium 1.9 1.7 - 2.4 mg/dL    Comment: Performed at [facility], [address], [place], [place] [address]  Basic metabolic panel     Status: Abnormal   Collection Time: 08/31/19  5:05 AM  Result Value Ref Range   Sodium 138 135 - 145 mmol/L   Potassium 3.9 3.5 - 5.1 mmol/L   Chloride 108 98 - 111 mmol/L   CO2 24 22 - 32 mmol/L   Glucose, Bld 110 (H) 70 - 99 mg/dL    Comment: Glucose reference range applies only to samples taken after fasting for at least 8 hours.   BUN 12 8 - 23 mg/dL   Creatinine, Ser 0.84 0.44 - 1.00 mg/dL   Calcium 9.8 8.9 - 10.3 mg/dL   GFR calc non Af Amer >60 >60 mL/min   GFR calc Af Amer >60 >60 mL/min   Anion gap 6 5 - 15    Comment: Performed at [facility], [address] [place], [place] [address]  CBC     Status: Abnormal   Collection Time: 08/31/19  5:05 AM   Result Value Ref Range   WBC 6.1 4.0 - 10.5 K/uL   RBC 3.21 (L) 3.87 - 5.11 MIL/uL   Hemoglobin 7.4 (L) 12.0 - 15.0 g/dL   HCT 25.9 (L) 36.0 - 46.0 %   MCV 80.7 80.0 - 100.0 fL   MCH 23.1 (L) 26.0 - 34.0 pg   MCHC 28.6 (L) 30.0 - 36.0 g/dL   RDW 15.1 11.5 - 15.5 %   Platelets 285 150 - 400 K/uL   nRBC 0.0 0.0 - 0.2 %    Comment: Performed at [facility], [address], [place], [place] [address]  Prepare RBC (crossmatch)     Status: None   Collection Time: 08/31/19  3:09 PM  Result  Value Ref Range   Order Confirmation      ORDER PROCESSED BY BLOOD BANK Performed at [facility], [address] [place], [place] [address]   Type and screen [facility]     Status: None   Collection Time: 08/31/19  3:09 PM  Result Value Ref Range   ABO/RH(D) A POS    Antibody Screen NEG    Sample Expiration 09/03/2019,2359    Unit Number [id number]    Blood Component Type RED CELLS,LR    Unit division 00    Status of Unit ISSUED,FINAL    Transfusion Status OK TO TRANSFUSE    Crossmatch Result      Compatible Performed at [facility], [address] [place], [place] [address]   BPAM RBC     Status: None   Collection Time: 08/31/19  3:09 PM  Result Value Ref Range   ISSUE DATE / TIME [id number]    Blood Product Unit Number [id number]    PRODUCT CODE [id number]    Unit Type and Rh 6200    Blood Product Expiration Date [id number]     RADIOGRAPHIC STUDIES: I have personally reviewed the radiological images as listed and agreed with the findings in the report. CT Angio Abd/Pel w/ and/or w/o  Result Date: 08/31/2019 CLINICAL DATA:  ANEMIA, SMALL-BOWEL AVMS AND EROSIONS BY CAPSULE ENDOSCOPY EXAM: CT ANGIOGRAPHY ABDOMEN AND PELVIS WITH CONTRAST AND WITHOUT CONTRAST TECHNIQUE: Multidetector CT imaging of the abdomen and pelvis was performed using the standard protocol during bolus administration of intravenous contrast. Multiplanar reconstructed images and MIPs were obtained and  reviewed to evaluate the vascular anatomy. CONTRAST:  134m OMNIPAQUE IOHEXOL 350 MG/ML SOLN COMPARISON:  07/20/2017 FINDINGS: VASCULAR Aorta: aortic atherosclerosis without aneurysm, dissection, occlusive process, hemorrhage or rupture. no acute vascular finding Celiac: Minor atherosclerotic origin but remains patent including its branches SMA: Minor atherosclerotic origin but remains patent including its branches Renals: Atherosclerotic origins with mild ostial narrowings but no occlusive process. No accessory renal vasculature. IMA: Remains patent off the distal aorta including its branches Inflow: Iliac atherosclerosis without inflow disease or occlusion. No acute vascular finding in the pelvis Proximal Outflow: Minor atherosclerosis but the common femoral, proximal profunda femoral, proximal superficial femoral arteries visualized are all patent Veins: No veno-occlusive process appreciated. Review of the MIP images confirms the above findings. NON-VASCULAR Lower chest: Clear lung bases. Cardiomegaly noted. No pericardial or pleural effusion. Hepatobiliary: No significant focal hepatic abnormality. No biliary dilatation. Patent hepatic and portal veins. Gallstones noted. Biliary system unremarkable. Common bile duct nondilated. Pancreas: Unremarkable. No pancreatic ductal dilatation or surrounding inflammatory changes. Spleen: Normal in size without focal abnormality. Adrenals/Urinary Tract: Stable subcentimeter right adrenal nodularity. No other significant adrenal finding. Kidneys demonstrate no significant cortical abnormality or hydronephrosis. No perinephric inflammatory process. Ureters are symmetric and decompressed. Contrast noted in the ureters bilaterally. No definite obstructing stone disease or hydroureter. Bladder unremarkable. Stomach/Bowel: Duodenal diverticula noted. No obstruction pattern, ileus, free air, or definite focal bowel abnormality. Scattered colonic diverticulosis. Radiopaque capsule  endoscopy noted within the distal small bowel in the left abdomen. No free fluid or ascites. No fluid collection or abscess. Lymphatic: No bulky adenopathy. Reproductive: Uterus and bilateral adnexa are unremarkable. Other: No abdominal wall hernia or abnormality. No abdominopelvic ascites. Musculoskeletal: Degenerative changes of the spine. No acute osseous finding. IMPRESSION: VASCULAR Abdominal atherosclerosis without mesenteric or renal vascular occlusive process. No evidence of acute vascular finding or active GI bleeding by CTA. NON-VASCULAR Diverticulosis without acute inflammatory process  Cholelithiasis Duodenal diverticula Cardiomegaly without CHF Electronically Signed   By: Jerilynn Mages.  Shick M.D.   On: 08/31/2019 14:29    ASSESSMENT & PLAN:  Normocytic anemia due to blood loss 1.  Normocytic anemia: -Patient recently admitted to the hospital from 08/30/2019 through 08/31/2019 with weakness and found to have hemoglobin of 7.6 and MCV of 8.2.  She has seen dark stool on the day of admission. -Last colonoscopy on 07/21/2017 shows multiple ileal diverticula, moderate diverticulosis in the entire colon, significant looping of the left colon, small external hemorrhoids. -EGD on 07/20/2017 shows normal esophagus, small hiatal hernia, normal duodenal bulb, second portion of the duodenum and third portion of the duodenum. -CT angio abdomen and pelvis on 08/31/2019 shows diverticulosis without inflammation, cholelithiasis, duodenal diverticula, cardiomegaly.  Spleen was normal. -Ferritin was 2 with percent saturation of 2 on 08/30/2019. -She did receive Feraheme on 08/31/2019 along with 1 unit of PRBC. -Capsule study showed small bowel AVMs, erosions, distal small bowel ulcerations and small amounts of fresh blood. -She is being referred to [facility] for double enteroscopy on biopsy to rule out Crohn's disease. -She took iron pill for 3 to 4 months last year but could not tolerate it secondary to nausea. -We will  check her CBC today.  Will check for other nutritional deficiencies including N27, folic acid, methylmalonic acid, copper levels.  We will also check SPEP.  Will check for hemolysis with LDH and reticulocyte count and haptoglobin. -We will arrange for 1 more Feraheme infusion.  We will see her back in 3 weeks for follow-up.     All questions were answered. The patient knows to call the clinic with any problems, questions or concerns.      Derek Jack, MD 09/08/19 1:23 PM

## 2019-09-08 NOTE — Telephone Encounter (Signed)
Called spoke to husband to have pt call when she is available

## 2019-09-08 NOTE — Assessment & Plan Note (Signed)
1.  Normocytic anemia: -Patient recently admitted to the hospital from 08/30/2019 through 08/31/2019 with weakness and found to have hemoglobin of 7.6 and MCV of 8.2.  She has seen dark stool on the day of admission. -Last colonoscopy on 07/21/2017 shows multiple ileal diverticula, moderate diverticulosis in the entire colon, significant looping of the left colon, small external hemorrhoids. -EGD on 07/20/2017 shows normal esophagus, small hiatal hernia, normal duodenal bulb, second portion of the duodenum and third portion of the duodenum. -CT angio abdomen and pelvis on 08/31/2019 shows diverticulosis without inflammation, cholelithiasis, duodenal diverticula, cardiomegaly.  Spleen was normal. -Ferritin was 2 with percent saturation of 2 on 08/30/2019. -She did receive Feraheme on 08/31/2019 along with 1 unit of PRBC. -Capsule study showed small bowel AVMs, erosions, distal small bowel ulcerations and small amounts of fresh blood. -She is being referred to [facility] for double enteroscopy on biopsy to rule out Crohn's disease. -She took iron pill for 3 to 4 months last year but could not tolerate it secondary to nausea. -We will check her CBC today.  Will check for other nutritional deficiencies including T06, folic acid, methylmalonic acid, copper levels.  We will also check SPEP.  Will check for hemolysis with LDH and reticulocyte count and haptoglobin. -We will arrange for 1 more Feraheme infusion.  We will see her back in 3 weeks for follow-up.

## 2019-09-08 NOTE — Patient Instructions (Signed)
[place] at [facility] Discharge Instructions  You were seen today by Dr. Delton Coombes. He went over your history, family history and hoe you've been feeling since your recent hospital stay. You will have blood drawn today before leaving the hospital. He will see you back in 3 weeks for follow up.   Thank you for choosing [place] at [facility] to provide your oncology and hematology care.  To afford each patient quality time with our provider, please arrive at least 15 minutes before your scheduled appointment time.   If you have a lab appointment with the [facility] please come in thru the  Main Entrance and check in at the main information desk  You need to re-schedule your appointment should you arrive 10 or more minutes late.  We strive to give you quality time with our providers, and arriving late affects you and other patients whose appointments are after yours.  Also, if you no show three or more times for appointments you may be dismissed from the clinic at the providers discretion.     Again, thank you for choosing [facility].  Our hope is that these requests will decrease the amount of time that you wait before being seen by our physicians.       _____________________________________________________________  Should you have questions after your visit to [facility], please contact our office at [phone] between the hours of 8:00 a.m. and 4:30 p.m.  Voicemails left after 4:00 p.m. will not be returned until the following business day.  For prescription refill requests, have your pharmacy contact our office and allow 72 hours.    Cancer Center Support Programs:   > Cancer Support Group  2nd Tuesday of the month 1pm-2pm, Journey Room

## 2019-09-09 ENCOUNTER — Encounter (HOSPITAL_COMMUNITY): Payer: Self-pay

## 2019-09-09 ENCOUNTER — Inpatient Hospital Stay (HOSPITAL_COMMUNITY): Payer: Medicare Other

## 2019-09-09 VITALS — BP 162/68 | HR 57 | Temp 97.9°F | Resp 18

## 2019-09-09 DIAGNOSIS — D649 Anemia, unspecified: Secondary | ICD-10-CM | POA: Diagnosis not present

## 2019-09-09 DIAGNOSIS — D500 Iron deficiency anemia secondary to blood loss (chronic): Secondary | ICD-10-CM

## 2019-09-09 DIAGNOSIS — D62 Acute posthemorrhagic anemia: Secondary | ICD-10-CM

## 2019-09-09 LAB — HAPTOGLOBIN: Haptoglobin: 105 mg/dL (ref 37–355)

## 2019-09-09 MED ORDER — SODIUM CHLORIDE 0.9 % IV SOLN: Freq: Once | INTRAVENOUS | Status: AC

## 2019-09-09 MED ORDER — SODIUM CHLORIDE 0.9 % IV SOLN
510.0000 mg | Freq: Once | INTRAVENOUS | Status: AC
  Administered 2019-09-09: 510 mg via INTRAVENOUS
  Filled 2019-09-09: qty 510

## 2019-09-09 NOTE — Progress Notes (Signed)
Patient tolerated iron infusion with no complaints voiced.  Peripheral IV site clean and dry with good blood return noted before and after infusion.  Band aid applied.  VSS with discharge and left ambulatory with no s/s of distress noted.

## 2019-09-09 NOTE — Telephone Encounter (Signed)
Called pt verified name and dob notified pt of prometheus test that needs  to be taken to the hosptital. Pt stated she will pick this test up om 4/26 when she has her appt with ll

## 2019-09-10 LAB — PROTEIN ELECTROPHORESIS, SERUM
A/G Ratio: 0.9 (ref 0.7–1.7)
Albumin ELP: 3.6 g/dL (ref 2.9–4.4)
Alpha-1-Globulin: 0.2 g/dL (ref 0.0–0.4)
Alpha-2-Globulin: 0.7 g/dL (ref 0.4–1.0)
Beta Globulin: 1 g/dL (ref 0.7–1.3)
Gamma Globulin: 2 g/dL — ABNORMAL HIGH (ref 0.4–1.8)
Globulin, Total: 3.9 g/dL (ref 2.2–3.9)
M-Spike, %: 1.7 g/dL — ABNORMAL HIGH
Total Protein ELP: 7.5 g/dL (ref 6.0–8.5)

## 2019-09-11 LAB — COPPER, SERUM: Copper: 123 ug/dL (ref 80–158)

## 2019-09-12 ENCOUNTER — Encounter: Payer: Self-pay | Admitting: Gastroenterology

## 2019-09-12 ENCOUNTER — Other Ambulatory Visit: Payer: Self-pay

## 2019-09-12 ENCOUNTER — Ambulatory Visit (INDEPENDENT_AMBULATORY_CARE_PROVIDER_SITE_OTHER): Payer: Medicare Other | Admitting: Gastroenterology

## 2019-09-12 ENCOUNTER — Telehealth: Payer: Self-pay

## 2019-09-12 VITALS — BP 162/110 | HR 63 | Temp 98.2°F | Ht 64.0 in | Wt 176.8 lb

## 2019-09-12 DIAGNOSIS — D500 Iron deficiency anemia secondary to blood loss (chronic): Secondary | ICD-10-CM | POA: Diagnosis not present

## 2019-09-12 DIAGNOSIS — K5520 Angiodysplasia of colon without hemorrhage: Secondary | ICD-10-CM | POA: Diagnosis not present

## 2019-09-12 DIAGNOSIS — K633 Ulcer of intestine: Secondary | ICD-10-CM

## 2019-09-12 NOTE — Patient Instructions (Addendum)
1. Complete [facility]ius labs as instructed. You will be contacted with results as available.  2. If you have not heard from [facility] about an appointment by the end of the week, please let us know.  3. Continue to follow with Dr. Tomie China office for your iron deficiency anemia.  4. Please go for abdominal xray this week to see if small bowel camera passed.

## 2019-09-12 NOTE — Telephone Encounter (Signed)
    Agree with recommendations as this is what we had discussed at the time of her visit given that Toprol-XL was recently reduced.   Signed, Erma Heritage, PA-C 09/12/2019, 4:51 PM Pager: [phone]

## 2019-09-12 NOTE — Telephone Encounter (Signed)
Pt has questions [id number]   Please call [phone]   Thanks renee

## 2019-09-12 NOTE — Assessment & Plan Note (Addendum)
Pleasant 69 y/o female presenting for hospital follow-up.  Recent acute on chronic anemia.  Profound iron deficiency.  Reported single burgundy colored stool was heme positive during admission.  Patient was evaluated in 2019 for anemia and heme positive stool with EGD and colonoscopy as outlined previously.  Multiple ileal diverticula noted but no ileal ulcers.  In 2019 she had abnormal ileocecal valve and terminal ileum endoscopically suggestive of Crohn's but biopsies did not support diagnosis.  CTAP at that time showed chronic inflammatory changes of the distal TI concerning for small bowel Crohn's, patient was not started on therapy due to absence of clinical symptoms at that time.  Current evaluation includes capsule endoscopy.  Capsule did not reach the cecum.  She was noted to have small bowel AVMs and erosions, ulcerations felt to be in the jejunum.  CTA abdomen with no evidence of mesenteric ischemia or small bowel findings.  Clinically she is improved.  She has received iron infusion since her hospitalization.  Multiple labs pending with hematology.  She is still waiting for an appointment at [facility] for jejunal ulcers and small bowel AVMs.  She is going for Prometheus IBD panel as previously ordered by Dr. Oneida Alar.  Further recommendations to follow pending work-up.  We will send her for KUB abdomen to evaluate for retained small bowel capsule camera.  Agree with follow-up with PCP tomorrow for anxiety and hypertension.

## 2019-09-12 NOTE — Progress Notes (Addendum)
REVIEWED. GIVENS APR 14. CT APR 14 CAPSULE IN LEFT ABDOMEN/DISTAL ILEUM. NO STRICTURE OR DILATIONI PERSONALLY REVIEWED THE KUB WITH DR. Thornton Papas. CAPSULE RETAINED IN ILEUM AT IC VALVE. PT HAS MULTIPLE ILEAL TICS. CAPSULE MAY BE RETAINED IN DIVERTICULA. ADD MIRALAX BID. REPEAT KUB ON MAY 12. DISCUSSED WITH PTS DAUGHTER.  GO TO NEAREST ED FOR ACUTE ONSET OF ABDOMINAL PAIN.  Primary Care Physician: Celene Squibb, MD  Primary Gastroenterologist:  Barney Drain, MD   Chief Complaint  Patient presents with  . Hospitalization Follow-up    hasn't seen any blood    HPI: Julia Martinez is a 69 y.o. female here for hospital follow-up.  Patient has a history of depression, hypertension, GI bleed in 2019 without identified source.  She presented to the ED earlier this month with progressive generalized weakness.  In the ED her hemoglobin was 7.6.  Appears baseline was in the 9-1/2 range in 2019 evaluating her for anemia.  No interim hemoglobins available in the system.  She was heme positive.  Patient had 2 iron infusions in 2020.  Denies black stools.  Day of admission she had one burgundy colored stool. Reflux symptoms frequently.  No PPI or OTC medication for this.  Denies NSAIDs.  Givens capsule during admission showed ulcerated distal jejunum, small bowel AVMs and occasional erosion.  Study was not complete to the cecum.  CTA showed no mesenteric stenosis.  Prior evaluation for anemia and heme positive stool included EGD March 2019 revealing normal esophagus, small hiatal hernia, normal examined duodenum.  No blood in the upper GI tract.  Colonoscopy March 2019 with multiple ileal diverticula, moderate diverticulosis in the entire examined colon, small external hemorrhoids.  Recommended agile capsule but this was never completed.  In 2009 she had abnormal ileocecal valve and terminal ileum suggestive of Crohn's disease but biopsy showed nonspecific ulcerations.  CTE revealed chronic inflammatory changes at  the distal TI, concerning for small bowel Crohn's.  No therapy was initiated due to the absence of clinical symptoms and recommended evaluation at [facility].  Less fatigued. Under a lot of stress with special needs son. He recently had severe sepsis as well. She states her BMs are regular, daily to every other day. Brown stool. No further bleeding. Has not seen capsule camera. Some central abdominal cramping, was a little worse one to two weeks ago. No n/v. Heartburn controlled on medication. Currently not taking oral iron due to nausea.   Sees PCP tomorrow to address anxiety/stress. She has also noted blood pressure up more.    Current Outpatient Medications  Medication Sig Dispense Refill  . diltiazem (DILACOR XR) 240 MG 24 hr capsule Take 1 capsule (240 mg total) by mouth daily. 90 capsule 3  . ferrous sulfate 325 (65 FE) MG EC tablet Take 325 mg by mouth as needed.     . metoprolol succinate (TOPROL-XL) 100 MG 24 hr tablet Take 1 tablet (100 mg total) by mouth daily. Take with or immediately following a meal. 90 tablet 3  . pantoprazole (PROTONIX) 40 MG tablet Take 1 tablet (40 mg total) by mouth daily. 30 tablet 1   No current facility-administered medications for this visit.    Allergies as of 09/12/2019 - Review Complete 09/12/2019  Allergen Reaction Noted  . Penicillins Nausea Only 10/06/2013   Past Medical History:  Diagnosis Date  . Anemia   . Depression   . Essential hypertension   . OSA (obstructive sleep apnea)    Mild by  sleep study 2019  . PSVT (paroxysmal supraventricular tachycardia) ([place])   . Ulcer of small intestine    Past Surgical History:  Procedure Laterality Date  . CESAREAN SECTION    . COLONOSCOPY  2009   single external hemorrhoid tag, otherwise normal rectum. Numerous left-sided diverticula, abnormal IC valve and TI suggestive of Crohn's disease s/p biopsy. Biopsies with non-specific ulcerations. CTE then revealing chronic inflammatory change of distal TI,  concerning for SB Crohn's. Did not start therapy due to absence of clinical symptoms and recommended [facility] evaluation.  . COLONOSCOPY N/A 07/21/2017   Fields: Multiple ileal diverticulum, moderate diverticulosis in the entire examined colon, small external hemorrhoids.  . ESOPHAGOGASTRODUODENOSCOPY N/A 07/20/2017   Fields: Normal esophagus, small hiatal hernia, normal examined duodenum.  No blood in the upper GI tract.  Marland Kitchen GIVENS CAPSULE STUDY N/A 08/30/2019   Fields: Ulcerated distal jejunum, AVMs, occasional erosion, study not complete the cecum.  . WISDOM TOOTH EXTRACTION      ROS:  General: Negative for anorexia, weight loss, fever, chills, fatigue, weakness. ENT: Negative for hoarseness, difficulty swallowing , nasal congestion. CV: Negative for chest pain, angina, palpitations, dyspnea on exertion, peripheral edema.  Respiratory: Negative for dyspnea at rest, dyspnea on exertion, cough, sputum, wheezing.  GI: See history of present illness. GU:  Negative for dysuria, hematuria, urinary incontinence, urinary frequency, nocturnal urination.  Endo: Negative for unusual weight change.    Physical Examination:   BP (!) 182/90   Pulse 63   Temp 98.2 F (36.8 C) (Temporal)   Ht 5' 4"  (1.626 m)   Wt 176 lb 12.8 oz (80.2 kg)   BMI 30.35 kg/m   General: Well-nourished, well-developed in no acute distress.  Eyes: No icterus. Mouth:masked Lungs: Clear to auscultation bilaterally.  Heart: Regular rate and rhythm, no murmurs rubs or gallops.  Abdomen: Bowel sounds are normal, nontender, nondistended, no hepatosplenomegaly or masses, no abdominal bruits or hernia , no rebound or guarding.   Extremities: No lower extremity edema. No clubbing or deformities. Neuro: Alert and oriented x 4   Skin: Warm and dry, no jaundice.   Psych: Alert and cooperative, normal mood and affect.  Labs:  Lab Results  Component Value Date   CREATININE 0.84 08/31/2019   BUN 12 08/31/2019   NA 138  08/31/2019   K 3.9 08/31/2019   CL 108 08/31/2019   CO2 24 08/31/2019   Lab Results  Component Value Date   WBC 8.1 09/08/2019   HGB 10.7 (L) 09/08/2019   HCT 36.9 09/08/2019   MCV 85.4 09/08/2019   PLT 301 09/08/2019   Lab Results  Component Value Date   ALT 27 07/20/2017   AST 24 07/20/2017   ALKPHOS 102 07/20/2017   BILITOT 0.2 (L) 07/20/2017   Lab Results  Component Value Date   IRON 7 (L) 08/30/2019   TIBC 419 08/30/2019   FERRITIN 2 (L) 08/30/2019   Lab Results  Component Value Date   FOLATE 9.3 09/08/2019   Lab Results  Component Value Date   VITAMINB12 172 (L) 09/08/2019     Imaging Studies: CT Angio Abd/Pel w/ and/or w/o  Result Date: 08/31/2019 CLINICAL DATA:  ANEMIA, SMALL-BOWEL AVMS AND EROSIONS BY CAPSULE ENDOSCOPY EXAM: CT ANGIOGRAPHY ABDOMEN AND PELVIS WITH CONTRAST AND WITHOUT CONTRAST TECHNIQUE: Multidetector CT imaging of the abdomen and pelvis was performed using the standard protocol during bolus administration of intravenous contrast. Multiplanar reconstructed images and MIPs were obtained and reviewed to evaluate the vascular anatomy. CONTRAST:  182m OMNIPAQUE IOHEXOL 350 MG/ML SOLN COMPARISON:  07/20/2017 FINDINGS: VASCULAR Aorta: aortic atherosclerosis without aneurysm, dissection, occlusive process, hemorrhage or rupture. no acute vascular finding Celiac: Minor atherosclerotic origin but remains patent including its branches SMA: Minor atherosclerotic origin but remains patent including its branches Renals: Atherosclerotic origins with mild ostial narrowings but no occlusive process. No accessory renal vasculature. IMA: Remains patent off the distal aorta including its branches Inflow: Iliac atherosclerosis without inflow disease or occlusion. No acute vascular finding in the pelvis Proximal Outflow: Minor atherosclerosis but the common femoral, proximal profunda femoral, proximal superficial femoral arteries visualized are all patent Veins: No  veno-occlusive process appreciated. Review of the MIP images confirms the above findings. NON-VASCULAR Lower chest: Clear lung bases. Cardiomegaly noted. No pericardial or pleural effusion. Hepatobiliary: No significant focal hepatic abnormality. No biliary dilatation. Patent hepatic and portal veins. Gallstones noted. Biliary system unremarkable. Common bile duct nondilated. Pancreas: Unremarkable. No pancreatic ductal dilatation or surrounding inflammatory changes. Spleen: Normal in size without focal abnormality. Adrenals/Urinary Tract: Stable subcentimeter right adrenal nodularity. No other significant adrenal finding. Kidneys demonstrate no significant cortical abnormality or hydronephrosis. No perinephric inflammatory process. Ureters are symmetric and decompressed. Contrast noted in the ureters bilaterally. No definite obstructing stone disease or hydroureter. Bladder unremarkable. Stomach/Bowel: Duodenal diverticula noted. No obstruction pattern, ileus, free air, or definite focal bowel abnormality. Scattered colonic diverticulosis. Radiopaque capsule endoscopy noted within the distal small bowel in the left abdomen. No free fluid or ascites. No fluid collection or abscess. Lymphatic: No bulky adenopathy. Reproductive: Uterus and bilateral adnexa are unremarkable. Other: No abdominal wall hernia or abnormality. No abdominopelvic ascites. Musculoskeletal: Degenerative changes of the spine. No acute osseous finding. IMPRESSION: VASCULAR Abdominal atherosclerosis without mesenteric or renal vascular occlusive process. No evidence of acute vascular finding or active GI bleeding by CTA. NON-VASCULAR Diverticulosis without acute inflammatory process Cholelithiasis Duodenal diverticula Cardiomegaly without CHF Electronically Signed   By: MJerilynn Mages  Shick M.D.   On: 08/31/2019 14:29

## 2019-09-12 NOTE — Telephone Encounter (Signed)
States her systolic bp today was 662 at doctors office she thinks.She is going to records BP/HR at home for a few days and acll back.

## 2019-09-13 ENCOUNTER — Ambulatory Visit (HOSPITAL_COMMUNITY)
Admission: RE | Admit: 2019-09-13 | Discharge: 2019-09-13 | Disposition: A | Discharge status: Home / Self Care | Payer: Medicare Other | Source: Ambulatory Visit | Attending: Gastroenterology | Admitting: Gastroenterology

## 2019-09-13 ENCOUNTER — Other Ambulatory Visit (HOSPITAL_COMMUNITY)
Admission: RE | Admit: 2019-09-13 | Discharge: 2019-09-13 | Disposition: A | Discharge status: Home / Self Care | Payer: Medicare Other | Source: Ambulatory Visit | Attending: Gastroenterology | Admitting: Gastroenterology

## 2019-09-13 ENCOUNTER — Encounter: Payer: Self-pay | Admitting: Gastroenterology

## 2019-09-13 DIAGNOSIS — D500 Iron deficiency anemia secondary to blood loss (chronic): Secondary | ICD-10-CM

## 2019-09-13 DIAGNOSIS — K5520 Angiodysplasia of colon without hemorrhage: Secondary | ICD-10-CM | POA: Diagnosis not present

## 2019-09-13 DIAGNOSIS — K633 Ulcer of intestine: Secondary | ICD-10-CM | POA: Diagnosis not present

## 2019-09-13 DIAGNOSIS — N200 Calculus of kidney: Secondary | ICD-10-CM | POA: Diagnosis not present

## 2019-09-13 DIAGNOSIS — K529 Noninfective gastroenteritis and colitis, unspecified: Secondary | ICD-10-CM | POA: Insufficient documentation

## 2019-09-13 LAB — METHYLMALONIC ACID, SERUM: Methylmalonic Acid, Quantitative: 415 nmol/L — ABNORMAL HIGH (ref 0–378)

## 2019-09-13 NOTE — Addendum Note (Signed)
Addended by: Danie Binder on: 09/13/2019 11:45 AM   Modules accepted: Orders

## 2019-09-14 ENCOUNTER — Telehealth (HOSPITAL_COMMUNITY): Payer: Self-pay | Admitting: *Deleted

## 2019-09-14 ENCOUNTER — Other Ambulatory Visit (HOSPITAL_COMMUNITY): Payer: Self-pay | Admitting: *Deleted

## 2019-09-14 DIAGNOSIS — R778 Other specified abnormalities of plasma proteins: Secondary | ICD-10-CM

## 2019-09-14 NOTE — Telephone Encounter (Signed)
Spoke with the patient about her recent lab work and the abnormal results. I advised the pt that Dr. Delton Coombes wanted to get more blood work and a 24 hour urine to further evaluate her. The orders were placed and the pt will come tomorrow for her lab work.

## 2019-09-15 ENCOUNTER — Other Ambulatory Visit: Payer: Self-pay

## 2019-09-15 ENCOUNTER — Inpatient Hospital Stay (HOSPITAL_COMMUNITY): Payer: Medicare Other

## 2019-09-15 DIAGNOSIS — R778 Other specified abnormalities of plasma proteins: Secondary | ICD-10-CM

## 2019-09-15 DIAGNOSIS — D649 Anemia, unspecified: Secondary | ICD-10-CM | POA: Diagnosis not present

## 2019-09-15 LAB — LACTATE DEHYDROGENASE: LDH: 119 U/L (ref 98–192)

## 2019-09-16 DIAGNOSIS — D519 Vitamin B12 deficiency anemia, unspecified: Secondary | ICD-10-CM | POA: Diagnosis not present

## 2019-09-16 DIAGNOSIS — F410 Panic disorder [episodic paroxysmal anxiety] without agoraphobia: Secondary | ICD-10-CM | POA: Diagnosis not present

## 2019-09-16 LAB — BETA 2 MICROGLOBULIN, SERUM: Beta-2 Microglobulin: 2.6 mg/L — ABNORMAL HIGH (ref 0.6–2.4)

## 2019-09-16 LAB — KAPPA/LAMBDA LIGHT CHAINS
Kappa free light chain: 24.5 mg/L — ABNORMAL HIGH (ref 3.3–19.4)
Kappa, lambda light chain ratio: 1.93 — ABNORMAL HIGH (ref 0.26–1.65)
Lambda free light chains: 12.7 mg/L (ref 5.7–26.3)

## 2019-09-19 ENCOUNTER — Other Ambulatory Visit (HOSPITAL_COMMUNITY): Payer: Self-pay | Admitting: *Deleted

## 2019-09-19 DIAGNOSIS — D472 Monoclonal gammopathy: Secondary | ICD-10-CM

## 2019-09-19 LAB — IMMUNOFIXATION ELECTROPHORESIS
IgA: 95 mg/dL (ref 87–352)
IgG (Immunoglobin G), Serum: 2205 mg/dL — ABNORMAL HIGH (ref 586–1602)
IgM (Immunoglobulin M), Srm: 44 mg/dL (ref 26–217)
Total Protein ELP: 7.6 g/dL (ref 6.0–8.5)

## 2019-09-21 ENCOUNTER — Other Ambulatory Visit (HOSPITAL_COMMUNITY): Payer: Medicare Other

## 2019-09-21 ENCOUNTER — Telehealth: Payer: Self-pay | Admitting: Emergency Medicine

## 2019-09-21 ENCOUNTER — Other Ambulatory Visit: Payer: Self-pay

## 2019-09-21 ENCOUNTER — Telehealth: Payer: Self-pay | Admitting: Internal Medicine

## 2019-09-21 ENCOUNTER — Ambulatory Visit (HOSPITAL_COMMUNITY)
Admission: RE | Admit: 2019-09-21 | Discharge: 2019-09-21 | Disposition: A | Discharge status: Home / Self Care | Payer: Medicare Other | Source: Ambulatory Visit | Attending: Hematology | Admitting: Hematology

## 2019-09-21 DIAGNOSIS — D509 Iron deficiency anemia, unspecified: Secondary | ICD-10-CM

## 2019-09-21 DIAGNOSIS — Z79899 Other long term (current) drug therapy: Secondary | ICD-10-CM

## 2019-09-21 DIAGNOSIS — D472 Monoclonal gammopathy: Secondary | ICD-10-CM | POA: Diagnosis not present

## 2019-09-21 MED ORDER — PREDNISONE 10 MG PO TABS: ORAL_TABLET | ORAL | 0 refills | Status: DC

## 2019-09-21 MED ORDER — BUDESONIDE 3 MG PO CPEP: 9.0000 mg | ORAL_CAPSULE | Freq: Every day | ORAL | 0 refills | Status: DC

## 2019-09-21 MED ORDER — MESALAMINE ER 500 MG PO CPCR: ORAL_CAPSULE | ORAL | 11 refills | Status: DC

## 2019-09-21 MED ORDER — BUDESONIDE 3 MG PO CPEP
9.0000 mg | ORAL_CAPSULE | Freq: Every day | ORAL | 1 refills | Status: DC
Start: 2019-09-21 — End: 2019-09-21

## 2019-09-21 NOTE — Addendum Note (Signed)
Addended by: Danie Binder on: 09/21/2019 01:40 PM   Modules accepted: Orders

## 2019-09-21 NOTE — Telephone Encounter (Signed)
CALLED PT. DISCUSSED ALTERNATIVES TO ENTOCORT AND PENTASA. WILL TRY PREDNISONE AND CONSIDER IMURAN AND BIOLOGICS. WILL START PREDNISONE TAPER.

## 2019-09-21 NOTE — Telephone Encounter (Signed)
Prometheus results in box

## 2019-09-21 NOTE — Telephone Encounter (Signed)
Patient aware to go may 12 for KUB. No appt is needed

## 2019-09-21 NOTE — Telephone Encounter (Signed)
Dr. Gala Romney called earlier and wanted patient to follow up in 2 weeks. Is that ok?

## 2019-09-21 NOTE — Addendum Note (Signed)
Addended by: Danie Binder on: 09/21/2019 12:02 PM   Modules accepted: Orders

## 2019-09-21 NOTE — Addendum Note (Signed)
Addended by: Danie Binder on: 09/21/2019 01:09 PM   Modules accepted: Orders

## 2019-09-21 NOTE — Telephone Encounter (Signed)
Called patient TO DISCUSS MEDS. LVM call [phone] to discuss. ALSO HAD RADIOLOGY LOOK AT BONE XRAYS. CAPSULE STILL PRESENT ON PELVIC VIEW. CAN'T AFFORD PENTASA. WILL DISCUSS ALTERNATIVES: PREDNISONE,  IMIURAN, BIOLOGICS.

## 2019-09-21 NOTE — Telephone Encounter (Signed)
FINAL READ ON BONE XRAY SHOWS CAPSULE IN PELVIS. PT AWARE. NEEDS KUB @MAY  12. OPV IN2 WEEKS PER DR. Gala Romney.

## 2019-09-21 NOTE — Telephone Encounter (Signed)
CALLED PT'S DAUGHTER: ASCA POSITIVE, pANCA. REVIEWED BONE MET SCAN. NO CAPSULE IN ABDOMEN.  NEGATIVE DISCUSSED FINDINGS AND PLAN:  1. ADD ENTOCORT 9 MG DAILY FOR 8 WEEKS. 2. START PENTASA DAILY. CHECK CMP YEARLY. 3. FOLLOW UP WITH [facility] IF IRON DEFICIENCY ANEMIA PERSISTS. 4. FOLLOW UP IN 4 MOS.   Called patient: [phone] TO DISCUSS RESULTS. NO NEED TO HAVE KUB. PT VOICED HER UNDERSTANDING.

## 2019-09-21 NOTE — Telephone Encounter (Signed)
Jonni Sanger from [facility] called and stated the 2 medications that were sent in for the pt are over $1000 a month with pts insurance. Jonni Sanger wants to know if it is ok to swap out the mesaline 576m to masalimine 4063m   2 PO QID FOR 1 MONTH THEN 2 PO TID. He stated it will be cheaper

## 2019-09-21 NOTE — Telephone Encounter (Signed)
Patient has upcoming appt in 2 weeks and RMR wants her to have labs prior.  Cbc, crp and sed rate

## 2019-09-21 NOTE — Telephone Encounter (Signed)
Labs have been ordered, released and faxed to [facility]. Pt is aware and will have labs completed.

## 2019-09-21 NOTE — Telephone Encounter (Signed)
KUB order is already placed from prior

## 2019-09-23 LAB — UPEP/UIFE/LIGHT CHAINS/TP, 24-HR UR
% BETA, Urine: 0 %
ALPHA 1 URINE: 0 %
Albumin, U: 0 %
Alpha 2, Urine: 0 %
Free Kappa Lt Chains,Ur: 3.25 mg/L (ref 0.63–113.79)
Free Kappa/Lambda Ratio: >4.64 (ref 1.03–31.76)
Free Lambda Lt Chains,Ur: <0.7 mg/L (ref 0.47–11.77)
GAMMA GLOBULIN URINE: 0 %
Total Protein, Urine-Ur/day: 282 mg/24 hr — ABNORMAL HIGH (ref 30–150)
Total Protein, Urine: 17.9 mg/dL
Total Volume: 1575

## 2019-10-03 ENCOUNTER — Encounter (HOSPITAL_COMMUNITY): Payer: Self-pay | Admitting: *Deleted

## 2019-10-03 ENCOUNTER — Encounter (HOSPITAL_COMMUNITY): Payer: Self-pay | Admitting: Hematology

## 2019-10-03 ENCOUNTER — Other Ambulatory Visit (HOSPITAL_COMMUNITY): Payer: Self-pay | Admitting: *Deleted

## 2019-10-03 ENCOUNTER — Inpatient Hospital Stay (HOSPITAL_COMMUNITY): Payer: Medicare Other | Attending: Hematology | Admitting: Hematology

## 2019-10-03 ENCOUNTER — Other Ambulatory Visit (HOSPITAL_COMMUNITY)
Admission: RE | Admit: 2019-10-03 | Discharge: 2019-10-03 | Disposition: A | Discharge status: Home / Self Care | Payer: Medicare Other | Source: Ambulatory Visit | Attending: Internal Medicine | Admitting: Internal Medicine

## 2019-10-03 ENCOUNTER — Other Ambulatory Visit: Payer: Self-pay

## 2019-10-03 VITALS — BP 156/70 | HR 60 | Temp 97.3°F | Resp 18 | Wt 179.0 lb

## 2019-10-03 DIAGNOSIS — D472 Monoclonal gammopathy: Secondary | ICD-10-CM | POA: Insufficient documentation

## 2019-10-03 DIAGNOSIS — D509 Iron deficiency anemia, unspecified: Secondary | ICD-10-CM | POA: Insufficient documentation

## 2019-10-03 DIAGNOSIS — K5521 Angiodysplasia of colon with hemorrhage: Secondary | ICD-10-CM | POA: Insufficient documentation

## 2019-10-03 DIAGNOSIS — Z7952 Long term (current) use of systemic steroids: Secondary | ICD-10-CM | POA: Diagnosis not present

## 2019-10-03 DIAGNOSIS — E538 Deficiency of other specified B group vitamins: Secondary | ICD-10-CM | POA: Insufficient documentation

## 2019-10-03 DIAGNOSIS — Z79899 Other long term (current) drug therapy: Secondary | ICD-10-CM | POA: Diagnosis not present

## 2019-10-03 DIAGNOSIS — K5090 Crohn's disease, unspecified, without complications: Secondary | ICD-10-CM | POA: Diagnosis not present

## 2019-10-03 LAB — CBC WITH DIFFERENTIAL/PLATELET
Abs Immature Granulocytes: 0.04 10*3/uL (ref 0.00–0.07)
Basophils Absolute: 0 10*3/uL (ref 0.0–0.1)
Basophils Relative: 0 %
Eosinophils Absolute: 0 10*3/uL (ref 0.0–0.5)
Eosinophils Relative: 0 %
HCT: 43.6 % (ref 36.0–46.0)
Hemoglobin: 13.6 g/dL (ref 12.0–15.0)
Immature Granulocytes: 0 %
Lymphocytes Relative: 10 %
Lymphs Abs: 1.2 10*3/uL (ref 0.7–4.0)
MCH: 27.6 pg (ref 26.0–34.0)
MCHC: 31.2 g/dL (ref 30.0–36.0)
MCV: 88.6 fL (ref 80.0–100.0)
Monocytes Absolute: 0.6 10*3/uL (ref 0.1–1.0)
Monocytes Relative: 5 %
Neutro Abs: 9.4 10*3/uL — ABNORMAL HIGH (ref 1.7–7.7)
Neutrophils Relative %: 85 %
Platelets: 253 10*3/uL (ref 150–400)
RBC: 4.92 MIL/uL (ref 3.87–5.11)
RDW: 21.9 % — ABNORMAL HIGH (ref 11.5–15.5)
WBC: 11.2 10*3/uL — ABNORMAL HIGH (ref 4.0–10.5)
nRBC: 0 % (ref 0.0–0.2)

## 2019-10-03 LAB — SEDIMENTATION RATE: Sed Rate: 3 mm/hr (ref 0–22)

## 2019-10-03 LAB — C-REACTIVE PROTEIN: CRP: 0.5 mg/dL (ref <1.0)

## 2019-10-03 NOTE — Patient Instructions (Signed)
[place] at [facility] Discharge Instructions  You were seen today by Dr. Delton Coombes. He went over your recent lab and scan results. You have MGUS and it does not require any treatment at this time. We will continue to monitor your blood work and go from there. He will see you back in 3 months for labs and follow up.   Thank you for choosing [place] at [facility] to provide your oncology and hematology care.  To afford each patient quality time with our provider, please arrive at least 15 minutes before your scheduled appointment time.   If you have a lab appointment with the [facility] please come in thru the  Main Entrance and check in at the main information desk  You need to re-schedule your appointment should you arrive 10 or more minutes late.  We strive to give you quality time with our providers, and arriving late affects you and other patients whose appointments are after yours.  Also, if you no show three or more times for appointments you may be dismissed from the clinic at the providers discretion.     Again, thank you for choosing [facility].  Our hope is that these requests will decrease the amount of time that you wait before being seen by our physicians.       _____________________________________________________________  Should you have questions after your visit to [facility], please contact our office at [phone] between the hours of 8:00 a.m. and 4:30 p.m.  Voicemails left after 4:00 p.m. will not be returned until the following business day.  For prescription refill requests, have your pharmacy contact our office and allow 72 hours.    Cancer Center Support Programs:   > Cancer Support Group  2nd Tuesday of the month 1pm-2pm, Journey Room

## 2019-10-03 NOTE — Assessment & Plan Note (Addendum)
1.  IgG kappa MGUS: -Anemia work-up showed 1.7 g of IgG kappa monoclonal gammopathy.  Kappa light chain was elevated at 24.5 with ratio of 1.93.  LDH was normal.  Beta-2 microglobulin was 2.6.  -24-hour urine shows total protein 282 mg.  UPEP, urine immunofixation and free light chains were negative. -Skeletal survey on 09/21/2019 did not show any lytic lesions. -She does not have "CRAB" features.  We discussed the normal prognosis of MGUS with 1% of patients transforming to myeloma per year.  Will consider bone marrow biopsy if there is any significant changes in the labs. -We will follow up in 3 months with labs.  2.  Normocytic anemia: -Recent admission to the hospital on 08/30/2019 with hemoglobin 7.6 and dark stools, status post 1 unit PRBC. -Colonoscopy on 07/21/2017 showing multiple ileal diverticula, moderate diverticulosis in the entire colon, significant looping of the left colon and small external hemorrhoids. -EGD on 07/20/2017 shows normal esophagus, small hiatal hernia, normal duodenal bulb, second portion of the duodenum and third portion of the duodenum. -CT angio abdomen and pelvis on 08/31/2019 shows diverticulosis without inflammation, cold lithiasis, duodenal diverticula, cardiomegaly.  Spleen normal. -Capsule study showed small bowel AVMs, erosions, distal small bowel ulceration and small amounts of fresh blood. -Capsule apparently stuck at the ileocecal junction.  She will have a colonoscopy by Dr. Buford Dresser to try to retrieve it. -Ferritin was 2 and percent saturation was 2 on 08/30/2019. -She received Feraheme on 09/09/2019.  Hemoglobin improved to 13.6.  She is feeling better. -We plan to repeat CBC, ferritin and iron panel in 3 months.  3.  Vitamin B12 deficiency: -Vitamin B12 was 172 with methylmalonic acid 415. -She will start taking vitamin B12 1 mg daily.  We will check B12 in 3 months.

## 2019-10-03 NOTE — Progress Notes (Signed)
[place], [place] [address]   CLINIC:  Medical Oncology/Hematology  PCP:  Celene Squibb, MD [address] [place] [address] [phone]   REASON FOR VISIT:  Follow-up for iron deficiency anemia and IgG kappa MGUS.  PRIOR THERAPY: Feraheme on 08/31/2019 and 09/09/2019.    INTERVAL HISTORY:  Julia Martinez 69 y.o. female seen for follow-up of anemia and MGUS.  Denies any new onset bone pains.  Reported improvement in energy levels after iron infusion.  Appetite is 100%.  Energy levels are 75%.  Reportedly started on Entocort capsule for her Crohn's disease.  She does not report any abdominal pains.    REVIEW OF SYSTEMS:  Review of Systems  All other systems reviewed and are negative.    PAST MEDICAL/SURGICAL HISTORY:  Past Medical History:  Diagnosis Date  . Anemia   . Depression   . Essential hypertension   . OSA (obstructive sleep apnea)    Mild by sleep study 2019  . PSVT (paroxysmal supraventricular tachycardia) ([facility])   . Ulcer of small intestine    Past Surgical History:  Procedure Laterality Date  . CESAREAN SECTION    . COLONOSCOPY  2009   single external hemorrhoid tag, otherwise normal rectum. Numerous left-sided diverticula, abnormal IC valve and TI suggestive of Crohn's disease s/p biopsy. Biopsies with non-specific ulcerations. CTE then revealing chronic inflammatory change of distal TI, concerning for SB Crohn's. Did not start therapy due to absence of clinical symptoms and recommended [facility] evaluation.  . COLONOSCOPY N/A 07/21/2017   Fields: Multiple ileal diverticulum, moderate diverticulosis in the entire examined colon, small external hemorrhoids.  . ESOPHAGOGASTRODUODENOSCOPY N/A 07/20/2017   Fields: Normal esophagus, small hiatal hernia, normal examined duodenum.  No blood in the upper GI tract.  Marland Kitchen GIVENS CAPSULE STUDY N/A 08/30/2019   Fields: Ulcerated distal jejunum, AVMs, occasional erosion, study not  complete the cecum.  . WISDOM TOOTH EXTRACTION       SOCIAL HISTORY:  Social History   Socioeconomic History  . Marital status: Married    Spouse name: Not on file  . Number of children: 3  . Years of education: Not on file  . Highest education level: Not on file  Occupational History  . Occupation: paraprofessional  Tobacco Use  . Smoking status: Never Smoker  . Smokeless tobacco: Never Used  Substance and Sexual Activity  . Alcohol use: No  . Drug use: No  . Sexual activity: Not Currently  Other Topics Concern  . Not on file  Social History Narrative  . Not on file   Social Determinants of Health   Financial Resource Strain:   . Difficulty of Paying Living Expenses:   Food Insecurity:   . Worried About Charity fundraiser in the Last Year:   . Arboriculturist in the Last Year:   Transportation Needs:   . Film/video editor (Medical):   Marland Kitchen Lack of Transportation (Non-Medical):   Physical Activity:   . Days of Exercise per Week:   . Minutes of Exercise per Session:   Stress:   . Feeling of Stress :   Social Connections:   . Frequency of Communication with Friends and Family:   . Frequency of Social Gatherings with Friends and Family:   . Attends Religious Services:   . Active Member of Clubs or Organizations:   . Attends Archivist Meetings:   Marland Kitchen Marital Status:   Intimate Production manager  Violence:   . Fear of Current or Ex-Partner:   . Emotionally Abused:   Marland Kitchen Physically Abused:   . Sexually Abused:     FAMILY HISTORY:  Family History  Problem Relation Age of Onset  . Depression Mother   . Ulcers Mother   . Emphysema Father   . Prader-Willi syndrome Son   . Colon cancer Neg Hx   . Colon polyps Neg Hx     CURRENT MEDICATIONS:  Outpatient Encounter Medications as of 10/03/2019  Medication Sig  . budesonide (ENTOCORT EC) 3 MG 24 hr capsule Take 9 mg by mouth 3 (three) times daily.   Marland Kitchen diltiazem (DILACOR XR) 240 MG 24 hr capsule Take 1 capsule  (240 mg total) by mouth daily.  . metoprolol succinate (TOPROL-XL) 100 MG 24 hr tablet Take 1 tablet (100 mg total) by mouth daily. Take with or immediately following a meal.  . pantoprazole (PROTONIX) 40 MG tablet Take 1 tablet (40 mg total) by mouth daily.  . ferrous sulfate 325 (65 FE) MG EC tablet Take 325 mg by mouth as needed.   . [DISCONTINUED] predniSONE (DELTASONE) 10 MG tablet 1 PO QD FOR 14 DAYS THEN  THEN 1/2 PO QD FOR 14 DAYS   No facility-administered encounter medications on file as of 10/03/2019.    ALLERGIES:  Allergies  Allergen Reactions  . Penicillins Nausea Only     PHYSICAL EXAM:  ECOG Performance status: 1  Vitals:   10/03/19 1552  BP: (!) 156/70  Pulse: 60  Resp: 18  Temp: (!) 97.3 F (36.3 C)  SpO2: 97%   Filed Weights   10/03/19 1552  Weight: 179 lb (81.2 kg)   Physical Exam Vitals reviewed.  Constitutional:      Appearance: Normal appearance.  HENT:     Head: Normocephalic.  Neurological:     General: No focal deficit present.     Mental Status: She is alert and oriented to person, place, and time.  Psychiatric:        Mood and Affect: Mood normal.        Behavior: Behavior normal.      LABORATORY DATA:  I have reviewed the labs as listed.  CBC    Component Value Date/Time   WBC 11.2 (H) 10/03/2019 0941   RBC 4.92 10/03/2019 0941   HGB 13.6 10/03/2019 0941   HCT 43.6 10/03/2019 0941   PLT 253 10/03/2019 0941   MCV 88.6 10/03/2019 0941   MCH 27.6 10/03/2019 0941   MCHC 31.2 10/03/2019 0941   RDW 21.9 (H) 10/03/2019 0941   LYMPHSABS 1.2 10/03/2019 0941   MONOABS 0.6 10/03/2019 0941   EOSABS 0.0 10/03/2019 0941   BASOSABS 0.0 10/03/2019 0941   CMP Latest Ref Rng & Units 08/31/2019 08/30/2019 07/20/2017  Glucose 70 - 99 mg/dL 110(H) 168(H) 198(H)  BUN 8 - 23 mg/dL 12 20 20   Creatinine 0.44 - 1.00 mg/dL 0.84 1.02(H) 0.88  Sodium 135 - 145 mmol/L 138 137 137  Potassium 3.5 - 5.1 mmol/L 3.9 4.0 4.5  Chloride 98 - 111 mmol/L 108  108 106  CO2 22 - 32 mmol/L 24 24 22   Calcium 8.9 - 10.3 mg/dL 9.8 9.3 9.9  Total Protein 6.5 - 8.1 g/dL - - 7.6  Total Bilirubin 0.3 - 1.2 mg/dL - - 0.2(L)  Alkaline Phos 38 - 126 U/L - - 102  AST 15 - 41 U/L - - 24  ALT 14 - 54 U/L - - 27  DIAGNOSTIC IMAGING:  I have reviewed scans.  ASSESSMENT & PLAN:  Monoclonal gammopathy of unknown significance (MGUS) 1.  IgG kappa MGUS: -Anemia work-up showed 1.7 g of IgG kappa monoclonal gammopathy.  Kappa light chain was elevated at 24.5 with ratio of 1.93.  LDH was normal.  Beta-2 microglobulin was 2.6.  -24-hour urine shows total protein 282 mg.  UPEP, urine immunofixation and free light chains were negative. -Skeletal survey on 09/21/2019 did not show any lytic lesions. -She does not have "CRAB" features.  We discussed the normal prognosis of MGUS with 1% of patients transforming to myeloma per year.  Will consider bone marrow biopsy if there is any significant changes in the labs. -We will follow up in 3 months with labs.  2.  Normocytic anemia: -Recent admission to the hospital on 08/30/2019 with hemoglobin 7.6 and dark stools, status post 1 unit PRBC. -Colonoscopy on 07/21/2017 showing multiple ileal diverticula, moderate diverticulosis in the entire colon, significant looping of the left colon and small external hemorrhoids. -EGD on 07/20/2017 shows normal esophagus, small hiatal hernia, normal duodenal bulb, second portion of the duodenum and third portion of the duodenum. -CT angio abdomen and pelvis on 08/31/2019 shows diverticulosis without inflammation, cold lithiasis, duodenal diverticula, cardiomegaly.  Spleen normal. -Capsule study showed small bowel AVMs, erosions, distal small bowel ulceration and small amounts of fresh blood. -Capsule apparently stuck at the ileocecal junction.  She will have a colonoscopy by Dr. Buford Dresser to try to retrieve it. -Ferritin was 2 and percent saturation was 2 on 08/30/2019. -She received Feraheme on  09/09/2019.  Hemoglobin improved to 13.6.  She is feeling better. -We plan to repeat CBC, ferritin and iron panel in 3 months.  3.  Vitamin B12 deficiency: -Vitamin B12 was 172 with methylmalonic acid 415. -She will start taking vitamin B12 1 mg daily.  We will check B12 in 3 months.      Orders placed this encounter:  Orders Placed This Encounter  Procedures  . CBC with Differential  . Comprehensive metabolic panel  . Protein electrophoresis, serum  . Kappa/lambda light chains  . Iron and TIBC  . Ferritin      Derek Jack, MD [place] [phone]

## 2019-10-04 ENCOUNTER — Telehealth: Payer: Self-pay | Admitting: Gastroenterology

## 2019-10-04 NOTE — Telephone Encounter (Signed)
Spoke with pt. Pt was notified of results.

## 2019-10-04 NOTE — Progress Notes (Signed)
Pt is scheduled for an ov next week and is due for labs prior to her apt. Lmom, waiting on a return call.

## 2019-10-04 NOTE — Telephone Encounter (Signed)
Pt said she was returning a call from AM. [phone]

## 2019-10-10 ENCOUNTER — Ambulatory Visit (INDEPENDENT_AMBULATORY_CARE_PROVIDER_SITE_OTHER): Payer: Medicare Other | Admitting: Gastroenterology

## 2019-10-10 ENCOUNTER — Encounter: Payer: Self-pay | Admitting: Gastroenterology

## 2019-10-10 ENCOUNTER — Other Ambulatory Visit: Payer: Self-pay

## 2019-10-10 VITALS — BP 154/88 | HR 62 | Temp 97.0°F | Ht 64.0 in | Wt 175.4 lb

## 2019-10-10 DIAGNOSIS — K633 Ulcer of intestine: Secondary | ICD-10-CM

## 2019-10-10 DIAGNOSIS — D500 Iron deficiency anemia secondary to blood loss (chronic): Secondary | ICD-10-CM

## 2019-10-10 NOTE — Assessment & Plan Note (Signed)
69 year old female with recent hospitalization for acute on chronic anemia, profound iron deficiency he reported single burgundy colored stool and was heme positive during her admission.  She had an EGD and colonoscopy in 2019 for anemia and heme positive stool which was unrevealing.  Recommended to have agile capsule after that but this was never completed.  In 2009 she had an abnormal ileocecal valve and terminal ileum endoscopically suggestive of Crohn's but biopsies did not support the diagnosis.  CTE at that time revealed chronic inflammatory changes at the distal TI concerning for small bowel Crohn's but because the patient was clinically asymptomatic no therapy was initiated and she was advised to follow-up at [facility].  During her recent admission her work-up included capsule study which showed small bowel AVMs, erosions and ulcerated distal small bowel, likely in the distal jejunum/proximal ileum.  Study was not complete to the cecum.  Her last imaging showed retained capsule in the pelvis.  Currently has a referral pending at [facility] for consideration of balloon enteroscopy.  Recent [facility] labs with positive p-ANCA and ASCA.  Sed rate and CRP normal.  Patient currently on Entocort.  Discussed with Dr. Gala Romney at length, he advises ileocolonoscopy with slim scope with hopes of visualizing abnormal portion of small bowel to confirm diagnosis of Crohn's as well as to retrieve retained capsule.  Plan for stat KUB the morning of her procedure to verify location of retained capsule.   I have discussed the risks, alternatives, benefits with regards to but not limited to the risk of reaction to medication, bleeding, infection, perforation and the patient is agreeable to proceed. Written consent to be obtained. ASA II.

## 2019-10-10 NOTE — Patient Instructions (Signed)
1. Continue Entocort 70m daily (total of 3 capsules daily). You can take all 3 capsules at one time if you prefer. 2. Colonoscopy as scheduled. See separate instructions.

## 2019-10-10 NOTE — H&P (View-Only) (Signed)
    Primary Care Physician: Hall, John Z, MD  Primary Gastroenterologist:  Formerly Sandi Fields, MD  Chief Complaint  Patient presents with  . Anemia    f/u.     HPI: Julia Martinez is a 69 y.o. female here for follow-up.  She was treated in the hospital recently due to profound anemia, iron deficiency.  She was heme positive.  The day of admission she had had a burgundy colored stool.  No NSAID use.  Givens capsule during admission showed small bowel AVMs, erosions and ulcerated distal small bowel, likely in the distal jejunum/proximal ileum.  Study was not complete to the cecum.  CTA showed no mesenteric stenosis.  Prior evaluation for anemia and heme positive stools include EGD March 2019 revealing normal esophagus, small hiatal hernia, normal examined duodenum.  No blood in the upper GI tract.  Colonoscopy March 2019 with multiple ileal diverticula, moderate diverticulosis in the entire examined colon, small external hemorrhoids.  Recommended agile capsule at that time but never completed. In 2009 she had abnormal ileocecal valve and terminal ileum suggestive of Crohn's disease but biopsy showed nonspecific ulcerations.  CTE revealed chronic inflammatory changes at the distal TI, concerning for small bowel Crohn's.  No therapy was initiated due to the absence of clinical symptoms and recommended evaluation at [facility].  Patient had a follow-up KUB on September 13, 2019 to see if capsule passed.  It was noted to be inferior right pelvis, suspected in the distal ileum rather than the cecum.  She had a bone survey med scan by Dr. Katragadda that was unremarkable but the capsule was still present in the pelvis on that study per Dr. Fields.  Patient had a [facility] IBD panel with positive p-ANCA and ASCA.  She was started on Entocort 9 mg daily for 8 weeks, Pentasa as well.  She was unable to afford the Pentasa.  Labs updated Oct 03, 2019, hemoglobin normal, CRP normal as well as sed rate.  Patient  is followed by Dr. Katragadda for anemia and MGUS.  She also had vitamin B-12 deficiency and is on vitamin B12 injections.  Patient has been on Entocort for about two weeks. Delay in obtaining medication but finally able to get it for cash price of $90 per month. Could not afford Pentasa. No blood in stool. BM every day to every other day. Stool is not hard. No diarrhea. Very little abdominal cramping but not bad, ?related to nuts. Not related to stools. No heartburn or dysphagia.   Current Outpatient Medications  Medication Sig Dispense Refill  . budesonide (ENTOCORT EC) 3 MG 24 hr capsule Take 9 mg by mouth daily.     . diltiazem (DILACOR XR) 240 MG 24 hr capsule Take 1 capsule (240 mg total) by mouth daily. 90 capsule 3  . ferrous sulfate 325 (65 FE) MG EC tablet Take 325 mg by mouth as needed.     . metoprolol succinate (TOPROL-XL) 100 MG 24 hr tablet Take 1 tablet (100 mg total) by mouth daily. Take with or immediately following a meal. 90 tablet 3  . pantoprazole (PROTONIX) 40 MG tablet Take 1 tablet (40 mg total) by mouth daily. 30 tablet 1   No current facility-administered medications for this visit.    Allergies as of 10/10/2019 - Review Complete 10/10/2019  Allergen Reaction Noted  . Penicillins Nausea Only 10/06/2013   Past Medical History:  Diagnosis Date  . Anemia   . Depression   . Essential hypertension   .   OSA (obstructive sleep apnea)    Mild by sleep study 2019  . PSVT (paroxysmal supraventricular tachycardia) ([facility])   . Ulcer of small intestine    Past Surgical History:  Procedure Laterality Date  . CESAREAN SECTION    . COLONOSCOPY  2009   single external hemorrhoid tag, otherwise normal rectum. Numerous left-sided diverticula, abnormal IC valve and TI suggestive of Crohn's disease s/p biopsy. Biopsies with non-specific ulcerations. CTE then revealing chronic inflammatory change of distal TI, concerning for SB Crohn's. Did not start therapy due to absence of  clinical symptoms and recommended [facility] evaluation.  . COLONOSCOPY N/A 07/21/2017   Fields: Multiple ileal diverticulum, moderate diverticulosis in the entire examined colon, small external hemorrhoids.  . ESOPHAGOGASTRODUODENOSCOPY N/A 07/20/2017   Fields: Normal esophagus, small hiatal hernia, normal examined duodenum.  No blood in the upper GI tract.  . GIVENS CAPSULE STUDY N/A 08/30/2019   Fields: Small bowel AVMs, erosions and ulcerated distal small bowel, likely in the distal jejunum/proximal ileum, study not complete the cecum.  . WISDOM TOOTH EXTRACTION     Family History  Problem Relation Age of Onset  . Depression Mother   . Ulcers Mother   . Emphysema Father   . Prader-Willi syndrome Son   . Colon cancer Neg Hx   . Colon polyps Neg Hx    Social History   Tobacco Use  . Smoking status: Never Smoker  . Smokeless tobacco: Never Used  Substance Use Topics  . Alcohol use: No  . Drug use: No    ROS:  General: Negative for anorexia, weight loss, fever, chills, fatigue, weakness. ENT: Negative for hoarseness, difficulty swallowing , nasal congestion. CV: Negative for chest pain, angina, palpitations, dyspnea on exertion, peripheral edema.  Respiratory: Negative for dyspnea at rest, dyspnea on exertion, cough, sputum, wheezing.  GI: See history of present illness. GU:  Negative for dysuria, hematuria, urinary incontinence, urinary frequency, nocturnal urination.  Endo: Negative for unusual weight change.    Physical Examination:   BP (!) 154/88   Pulse 62   Temp (!) 97 F (36.1 C) (Oral)   Ht 5' 4" (1.626 m)   Wt 175 lb 6.4 oz (79.6 kg)   BMI 30.11 kg/m   General: Well-nourished, well-developed in no acute distress.  Eyes: No icterus. Mouth: masked Lungs: Clear to auscultation bilaterally.  Heart: Regular rate and rhythm, no murmurs rubs or gallops.  Abdomen: Bowel sounds are normal, nontender, nondistended, no hepatosplenomegaly or masses, no abdominal bruits or  hernia , no rebound or guarding.   Extremities: No lower extremity edema. No clubbing or deformities. Neuro: Alert and oriented x 4   Skin: Warm and dry, no jaundice.   Psych: Alert and cooperative, normal mood and affect.  Labs:  Lab Results  Component Value Date   CREATININE 0.84 08/31/2019   BUN 12 08/31/2019   NA 138 08/31/2019   K 3.9 08/31/2019   CL 108 08/31/2019   CO2 24 08/31/2019   Lab Results  Component Value Date   ALT 27 07/20/2017   AST 24 07/20/2017   ALKPHOS 102 07/20/2017   BILITOT 0.2 (L) 07/20/2017   Lab Results  Component Value Date   WBC 11.2 (H) 10/03/2019   HGB 13.6 10/03/2019   HCT 43.6 10/03/2019   MCV 88.6 10/03/2019   PLT 253 10/03/2019   Lab Results  Component Value Date   IRON 7 (L) 08/30/2019   TIBC 419 08/30/2019   FERRITIN 2 (L) 08/30/2019     Lab Results  Component Value Date   FOLATE 9.3 09/08/2019   Lab Results  Component Value Date   VITAMINB12 172 (L) 09/08/2019     Imaging Studies: DG Abd 1 View  Result Date: 09/13/2019 CLINICAL DATA:  Swallowed a capsule camera 2 weeks ago EXAM: ABDOMEN - 1 VIEW COMPARISON:  CT angio abdomen and pelvis 08/31/2019 FINDINGS: Camera capsule projects over the inferior RIGHT pelvis. On the prior CT exam, capsules located in the distal ileum LEFT of midline. This likely represents persistent distal ileal localization. This is inferior to the expected position of the cecum from position demonstrated on recent CT. Nonobstructive bowel gas pattern. Bones demineralized. Tiny calculus projects over the RIGHT kidney. Small RIGHT pelvic phleboliths. IMPRESSION: Camera capsule projects over the inferior RIGHT pelvis, suspected in distal ileum rather than cecum based on positions of ileum and cecum on recent CT. Tiny nonobstructing RIGHT renal calculus. Electronically Signed   By: Mark  Boles M.D.   On: 09/13/2019 15:11   DG Bone Survey Met  Result Date: 09/21/2019 CLINICAL DATA:  Monoclonal gammopathy of  uncertain significance. EXAM: METASTATIC BONE SURVEY COMPARISON:  None. FINDINGS: No evidence of focal lytic or sclerotic lesions. No acute compression fracture. Degenerative changes of the spine, sacroiliac joints and hips. Minimal biphasic curvature of the thoracolumbar spine. Mild degenerate change of the shoulders and hands. IMPRESSION: No focal abnormalities to suggest osseous myelomatous involvement. Electronically Signed   By: Daniel  Boyle M.D.   On: 09/21/2019 14:40       

## 2019-10-10 NOTE — Progress Notes (Signed)
    Primary Care Physician: Hall, John Z, MD  Primary Gastroenterologist:  Formerly Sandi Fields, MD  Chief Complaint  Patient presents with  . Anemia    f/u.     HPI: Julia Martinez is a 69 y.o. female here for follow-up.  She was treated in the hospital recently due to profound anemia, iron deficiency.  She was heme positive.  The day of admission she had had a burgundy colored stool.  No NSAID use.  Givens capsule during admission showed small bowel AVMs, erosions and ulcerated distal small bowel, likely in the distal jejunum/proximal ileum.  Study was not complete to the cecum.  CTA showed no mesenteric stenosis.  Prior evaluation for anemia and heme positive stools include EGD March 2019 revealing normal esophagus, small hiatal hernia, normal examined duodenum.  No blood in the upper GI tract.  Colonoscopy March 2019 with multiple ileal diverticula, moderate diverticulosis in the entire examined colon, small external hemorrhoids.  Recommended agile capsule at that time but never completed. In 2009 she had abnormal ileocecal valve and terminal ileum suggestive of Crohn's disease but biopsy showed nonspecific ulcerations.  CTE revealed chronic inflammatory changes at the distal TI, concerning for small bowel Crohn's.  No therapy was initiated due to the absence of clinical symptoms and recommended evaluation at [facility].  Patient had a follow-up KUB on September 13, 2019 to see if capsule passed.  It was noted to be inferior right pelvis, suspected in the distal ileum rather than the cecum.  She had a bone survey med scan by Dr. Katragadda that was unremarkable but the capsule was still present in the pelvis on that study per Dr. Fields.  Patient had a [facility] IBD panel with positive p-ANCA and ASCA.  She was started on Entocort 9 mg daily for 8 weeks, Pentasa as well.  She was unable to afford the Pentasa.  Labs updated Oct 03, 2019, hemoglobin normal, CRP normal as well as sed rate.  Patient  is followed by Dr. Katragadda for anemia and MGUS.  She also had vitamin B-12 deficiency and is on vitamin B12 injections.  Patient has been on Entocort for about two weeks. Delay in obtaining medication but finally able to get it for cash price of $90 per month. Could not afford Pentasa. No blood in stool. BM every day to every other day. Stool is not hard. No diarrhea. Very little abdominal cramping but not bad, ?related to nuts. Not related to stools. No heartburn or dysphagia.   Current Outpatient Medications  Medication Sig Dispense Refill  . budesonide (ENTOCORT EC) 3 MG 24 hr capsule Take 9 mg by mouth daily.     . diltiazem (DILACOR XR) 240 MG 24 hr capsule Take 1 capsule (240 mg total) by mouth daily. 90 capsule 3  . ferrous sulfate 325 (65 FE) MG EC tablet Take 325 mg by mouth as needed.     . metoprolol succinate (TOPROL-XL) 100 MG 24 hr tablet Take 1 tablet (100 mg total) by mouth daily. Take with or immediately following a meal. 90 tablet 3  . pantoprazole (PROTONIX) 40 MG tablet Take 1 tablet (40 mg total) by mouth daily. 30 tablet 1   No current facility-administered medications for this visit.    Allergies as of 10/10/2019 - Review Complete 10/10/2019  Allergen Reaction Noted  . Penicillins Nausea Only 10/06/2013   Past Medical History:  Diagnosis Date  . Anemia   . Depression   . Essential hypertension   .   OSA (obstructive sleep apnea)    Mild by sleep study 2019  . PSVT (paroxysmal supraventricular tachycardia) ([place])   . Ulcer of small intestine    Past Surgical History:  Procedure Laterality Date  . CESAREAN SECTION    . COLONOSCOPY  2009   single external hemorrhoid tag, otherwise normal rectum. Numerous left-sided diverticula, abnormal IC valve and TI suggestive of Crohn's disease s/p biopsy. Biopsies with non-specific ulcerations. CTE then revealing chronic inflammatory change of distal TI, concerning for SB Crohn's. Did not start therapy due to absence of  clinical symptoms and recommended [facility] evaluation.  . COLONOSCOPY N/A 07/21/2017   Fields: Multiple ileal diverticulum, moderate diverticulosis in the entire examined colon, small external hemorrhoids.  . ESOPHAGOGASTRODUODENOSCOPY N/A 07/20/2017   Fields: Normal esophagus, small hiatal hernia, normal examined duodenum.  No blood in the upper GI tract.  Marland Kitchen GIVENS CAPSULE STUDY N/A 08/30/2019   Fields: Small bowel AVMs, erosions and ulcerated distal small bowel, likely in the distal jejunum/proximal ileum, study not complete the cecum.  . WISDOM TOOTH EXTRACTION     Family History  Problem Relation Age of Onset  . Depression Mother   . Ulcers Mother   . Emphysema Father   . Prader-Willi syndrome Son   . Colon cancer Neg Hx   . Colon polyps Neg Hx    Social History   Tobacco Use  . Smoking status: Never Smoker  . Smokeless tobacco: Never Used  Substance Use Topics  . Alcohol use: No  . Drug use: No    ROS:  General: Negative for anorexia, weight loss, fever, chills, fatigue, weakness. ENT: Negative for hoarseness, difficulty swallowing , nasal congestion. CV: Negative for chest pain, angina, palpitations, dyspnea on exertion, peripheral edema.  Respiratory: Negative for dyspnea at rest, dyspnea on exertion, cough, sputum, wheezing.  GI: See history of present illness. GU:  Negative for dysuria, hematuria, urinary incontinence, urinary frequency, nocturnal urination.  Endo: Negative for unusual weight change.    Physical Examination:   BP (!) 154/88   Pulse 62   Temp (!) 97 F (36.1 C) (Oral)   Ht 5' 4" (1.626 m)   Wt 175 lb 6.4 oz (79.6 kg)   BMI 30.11 kg/m   General: Well-nourished, well-developed in no acute distress.  Eyes: No icterus. Mouth: masked Lungs: Clear to auscultation bilaterally.  Heart: Regular rate and rhythm, no murmurs rubs or gallops.  Abdomen: Bowel sounds are normal, nontender, nondistended, no hepatosplenomegaly or masses, no abdominal bruits or  hernia , no rebound or guarding.   Extremities: No lower extremity edema. No clubbing or deformities. Neuro: Alert and oriented x 4   Skin: Warm and dry, no jaundice.   Psych: Alert and cooperative, normal mood and affect.  Labs:  Lab Results  Component Value Date   CREATININE 0.84 08/31/2019   BUN 12 08/31/2019   NA 138 08/31/2019   K 3.9 08/31/2019   CL 108 08/31/2019   CO2 24 08/31/2019   Lab Results  Component Value Date   ALT 27 07/20/2017   AST 24 07/20/2017   ALKPHOS 102 07/20/2017   BILITOT 0.2 (L) 07/20/2017   Lab Results  Component Value Date   WBC 11.2 (H) 10/03/2019   HGB 13.6 10/03/2019   HCT 43.6 10/03/2019   MCV 88.6 10/03/2019   PLT 253 10/03/2019   Lab Results  Component Value Date   IRON 7 (L) 08/30/2019   TIBC 419 08/30/2019   FERRITIN 2 (L) 08/30/2019  Lab Results  Component Value Date   FOLATE 9.3 09/08/2019   Lab Results  Component Value Date   VITAMINB12 172 (L) 09/08/2019     Imaging Studies: DG Abd 1 View  Result Date: 09/13/2019 CLINICAL DATA:  Swallowed a capsule camera 2 weeks ago EXAM: ABDOMEN - 1 VIEW COMPARISON:  CT angio abdomen and pelvis 08/31/2019 FINDINGS: Camera capsule projects over the inferior RIGHT pelvis. On the prior CT exam, capsules located in the distal ileum LEFT of midline. This likely represents persistent distal ileal localization. This is inferior to the expected position of the cecum from position demonstrated on recent CT. Nonobstructive bowel gas pattern. Bones demineralized. Tiny calculus projects over the RIGHT kidney. Small RIGHT pelvic phleboliths. IMPRESSION: Camera capsule projects over the inferior RIGHT pelvis, suspected in distal ileum rather than cecum based on positions of ileum and cecum on recent CT. Tiny nonobstructing RIGHT renal calculus. Electronically Signed   By: Mark  Boles M.D.   On: 09/13/2019 15:11   DG Bone Survey Met  Result Date: 09/21/2019 CLINICAL DATA:  Monoclonal gammopathy of  uncertain significance. EXAM: METASTATIC BONE SURVEY COMPARISON:  None. FINDINGS: No evidence of focal lytic or sclerotic lesions. No acute compression fracture. Degenerative changes of the spine, sacroiliac joints and hips. Minimal biphasic curvature of the thoracolumbar spine. Mild degenerate change of the shoulders and hands. IMPRESSION: No focal abnormalities to suggest osseous myelomatous involvement. Electronically Signed   By: Daniel  Boyle M.D.   On: 09/21/2019 14:40       

## 2019-10-19 ENCOUNTER — Other Ambulatory Visit (HOSPITAL_COMMUNITY)
Admission: RE | Admit: 2019-10-19 | Discharge: 2019-10-19 | Disposition: A | Discharge status: Home / Self Care | Payer: Medicare Other | Source: Ambulatory Visit | Attending: Internal Medicine | Admitting: Internal Medicine

## 2019-10-19 ENCOUNTER — Telehealth: Payer: Self-pay | Admitting: Internal Medicine

## 2019-10-19 ENCOUNTER — Other Ambulatory Visit: Payer: Self-pay

## 2019-10-19 DIAGNOSIS — Z20822 Contact with and (suspected) exposure to covid-19: Secondary | ICD-10-CM | POA: Diagnosis not present

## 2019-10-19 DIAGNOSIS — Z01812 Encounter for preprocedural laboratory examination: Secondary | ICD-10-CM | POA: Diagnosis not present

## 2019-10-19 LAB — SARS CORONAVIRUS 2 (TAT 6-24 HRS): SARS Coronavirus 2: NEGATIVE

## 2019-10-19 NOTE — Telephone Encounter (Signed)
Please call courtney at [facility] [phone]   About a test we referred a patient there for

## 2019-10-19 NOTE — Telephone Encounter (Signed)
Called [facility] and spoke to Aubrey.  She was not sure what was needed.  She will call me back if she needs to.

## 2019-10-21 ENCOUNTER — Encounter (HOSPITAL_COMMUNITY): Payer: Self-pay | Admitting: Internal Medicine

## 2019-10-21 ENCOUNTER — Encounter (HOSPITAL_COMMUNITY)
Admission: RE | Disposition: A | Discharge status: Home / Self Care | Payer: Self-pay | Source: Home / Self Care | Attending: Internal Medicine

## 2019-10-21 ENCOUNTER — Ambulatory Visit (HOSPITAL_COMMUNITY)
Admission: RE | Admit: 2019-10-21 | Discharge: 2019-10-21 | Disposition: A | Discharge status: Home / Self Care | Payer: Medicare Other | Source: Ambulatory Visit | Attending: Internal Medicine | Admitting: Internal Medicine

## 2019-10-21 ENCOUNTER — Other Ambulatory Visit: Payer: Self-pay

## 2019-10-21 ENCOUNTER — Ambulatory Visit (HOSPITAL_COMMUNITY)
Admission: RE | Admit: 2019-10-21 | Discharge: 2019-10-21 | Disposition: A | Discharge status: Home / Self Care | Payer: Medicare Other | Attending: Internal Medicine | Admitting: Internal Medicine

## 2019-10-21 DIAGNOSIS — Z7952 Long term (current) use of systemic steroids: Secondary | ICD-10-CM | POA: Diagnosis not present

## 2019-10-21 DIAGNOSIS — F329 Major depressive disorder, single episode, unspecified: Secondary | ICD-10-CM | POA: Insufficient documentation

## 2019-10-21 DIAGNOSIS — K5730 Diverticulosis of large intestine without perforation or abscess without bleeding: Secondary | ICD-10-CM | POA: Insufficient documentation

## 2019-10-21 DIAGNOSIS — Z79899 Other long term (current) drug therapy: Secondary | ICD-10-CM | POA: Insufficient documentation

## 2019-10-21 DIAGNOSIS — D509 Iron deficiency anemia, unspecified: Secondary | ICD-10-CM | POA: Insufficient documentation

## 2019-10-21 DIAGNOSIS — I10 Essential (primary) hypertension: Secondary | ICD-10-CM | POA: Insufficient documentation

## 2019-10-21 DIAGNOSIS — R195 Other fecal abnormalities: Secondary | ICD-10-CM | POA: Diagnosis not present

## 2019-10-21 DIAGNOSIS — G4733 Obstructive sleep apnea (adult) (pediatric): Secondary | ICD-10-CM | POA: Insufficient documentation

## 2019-10-21 DIAGNOSIS — K56609 Unspecified intestinal obstruction, unspecified as to partial versus complete obstruction: Secondary | ICD-10-CM | POA: Diagnosis not present

## 2019-10-21 DIAGNOSIS — K633 Ulcer of intestine: Secondary | ICD-10-CM

## 2019-10-21 DIAGNOSIS — K6389 Other specified diseases of intestine: Secondary | ICD-10-CM | POA: Diagnosis not present

## 2019-10-21 DIAGNOSIS — D649 Anemia, unspecified: Secondary | ICD-10-CM

## 2019-10-21 HISTORY — PX: BIOPSY: SHX5522

## 2019-10-21 HISTORY — PX: COLONOSCOPY: SHX5424

## 2019-10-21 SURGERY — COLONOSCOPY
Anesthesia: Moderate Sedation

## 2019-10-21 MED ORDER — ONDANSETRON HCL 4 MG/2ML IJ SOLN
INTRAMUSCULAR | Status: DC | PRN
  Administered 2019-10-21: 4 mg via INTRAVENOUS

## 2019-10-21 MED ORDER — MIDAZOLAM HCL 5 MG/5ML IJ SOLN
INTRAMUSCULAR | Status: DC | PRN
  Administered 2019-10-21 (×3): 1 mg via INTRAVENOUS
  Administered 2019-10-21: 2 mg via INTRAVENOUS
  Administered 2019-10-21: 1 mg via INTRAVENOUS

## 2019-10-21 MED ORDER — MIDAZOLAM HCL 5 MG/5ML IJ SOLN
INTRAMUSCULAR | Status: AC
  Filled 2019-10-21: qty 10

## 2019-10-21 MED ORDER — MIDAZOLAM HCL 5 MG/5ML IJ SOLN
INTRAMUSCULAR | Status: AC
  Filled 2019-10-21: qty 5

## 2019-10-21 MED ORDER — ONDANSETRON HCL 4 MG/2ML IJ SOLN
INTRAMUSCULAR | Status: AC
  Filled 2019-10-21: qty 2

## 2019-10-21 MED ORDER — MEPERIDINE HCL 100 MG/ML IJ SOLN
INTRAMUSCULAR | Status: DC | PRN
  Administered 2019-10-21: 25 mg via INTRAVENOUS
  Administered 2019-10-21: 15 mg via INTRAVENOUS

## 2019-10-21 MED ORDER — STERILE WATER FOR IRRIGATION IR SOLN
Status: DC | PRN
  Administered 2019-10-21: 1.5 mL

## 2019-10-21 MED ORDER — MEPERIDINE HCL 50 MG/ML IJ SOLN
INTRAMUSCULAR | Status: AC
  Filled 2019-10-21: qty 1

## 2019-10-21 MED ORDER — SODIUM CHLORIDE 0.9 % IV SOLN
INTRAVENOUS | Status: DC
  Administered 2019-10-21: 1000 mL via INTRAVENOUS

## 2019-10-21 NOTE — Discharge Instructions (Signed)
Colonoscopy Discharge Instructions  Read the instructions outlined below and refer to this sheet in the next few weeks. These discharge instructions provide you with general information on caring for yourself after you leave the hospital. Your doctor may also give you specific instructions. While your treatment has been planned according to the most current medical practices available, unavoidable complications occasionally occur. If you have any problems or questions after discharge, call Dr. Gala Romney at [phone]. ACTIVITY  You may resume your regular activity, but move at a slower pace for the next 24 hours.   Take frequent rest periods for the next 24 hours.   Walking will help get rid of the air and reduce the bloated feeling in your belly (abdomen).   No driving for 24 hours (because of the medicine (anesthesia) used during the test).    Do not sign any important legal documents or operate any machinery for 24 hours (because of the anesthesia used during the test).  NUTRITION  Drink plenty of fluids.   You may resume your normal diet as instructed by your doctor.   Begin with a light meal and progress to your normal diet. Heavy or fried foods are harder to digest and may make you feel sick to your stomach (nauseated).   Avoid alcoholic beverages for 24 hours or as instructed.  MEDICATIONS  You may resume your normal medications unless your doctor tells you otherwise.  WHAT YOU CAN EXPECT TODAY  Some feelings of bloating in the abdomen.   Passage of more gas than usual.   Spotting of blood in your stool or on the toilet paper.  IF YOU HAD POLYPS REMOVED DURING THE COLONOSCOPY:  No aspirin products for 7 days or as instructed.   No alcohol for 7 days or as instructed.   Eat a soft diet for the next 24 hours.  FINDING OUT THE RESULTS OF YOUR TEST Not all test results are available during your visit. If your test results are not back during the visit, make an appointment  with your caregiver to find out the results. Do not assume everything is normal if you have not heard from your caregiver or the medical facility. It is important for you to follow up on all of your test results.  SEEK IMMEDIATE MEDICAL ATTENTION IF:  You have more than a spotting of blood in your stool.   Your belly is swollen (abdominal distention).   You are nauseated or vomiting.   You have a temperature over 101.   You have abdominal pain or discomfort that is severe or gets worse throughout the day.    Aside from diverticulosis your colon looked fine.  Your small intestine was abnormal there was severe stricturing or narrowing up in your small intestine.  This is likely where the capsule has gotten stuck.  I could not get across it with the scope.  There also areas of inflammation and ulcers in the small intestine entirely consistent with Crohn's disease.  Biopsies were taken.  Once again the biopsy report back the first of the week I will be in touch.  I anticipate you are going on a long-term medication management of Crohn's disease.  I will make further recommendations regarding the retained capsule next week.  At patient request I called [facility] at [phone].  Got voicemail and left a message.   Diverticulosis  Diverticulosis is a condition that develops when small pouches (diverticula) form in the wall of the large intestine (colon). The colon  is where water is absorbed and stool (feces) is formed. The pouches form when the inside layer of the colon pushes through weak spots in the outer layers of the colon. You may have a few pouches or many of them. The pouches usually do not cause problems unless they become inflamed or infected. When this happens, the condition is called diverticulitis. What are the causes? The cause of this condition is not known. What increases the risk? The following factors may make you more likely to develop this condition:  Being older  than age 67. Your risk for this condition increases with age. Diverticulosis is rare among people younger than age 15. By age 67, many people have it.  Eating a low-fiber diet.  Having frequent constipation.  Being overweight.  Not getting enough exercise.  Smoking.  Taking over-the-counter pain medicines, like aspirin and ibuprofen.  Having a family history of diverticulosis. What are the signs or symptoms? In most people, there are no symptoms of this condition. If you do have symptoms, they may include:  Bloating.  Cramps in the abdomen.  Constipation or diarrhea.  Pain in the lower left side of the abdomen. How is this diagnosed? Because diverticulosis usually has no symptoms, it is most often diagnosed during an exam for other colon problems. The condition may be diagnosed by:  Using a flexible scope to examine the colon (colonoscopy).  Taking an X-ray of the colon after dye has been put into the colon (barium enema).  Having a CT scan. How is this treated? You may not need treatment for this condition. Your health care provider may recommend treatment to prevent problems. You may need treatment if you have symptoms or if you previously had diverticulitis. Treatment may include:  Eating a high-fiber diet.  Taking a fiber supplement.  Taking a live bacteria supplement (probiotic).  Taking medicine to relax your colon. Follow these instructions at home: Medicines  Take over-the-counter and prescription medicines only as told by your health care provider.  If told by your health care provider, take a fiber supplement or probiotic. Constipation prevention Your condition may cause constipation. To prevent or treat constipation, you may need to:  Drink enough fluid to keep your urine pale yellow.  Take over-the-counter or prescription medicines.  Eat foods that are high in fiber, such as beans, whole grains, and fresh fruits and vegetables.  Limit foods that  are high in fat and processed sugars, such as fried or sweet foods.  General instructions  Try not to strain when you have a bowel movement.  Keep all follow-up visits as told by your health care provider. This is important. Contact a health care provider if you:  Have pain in your abdomen.  Have bloating.  Have cramps.  Have not had a bowel movement in 3 days. Get help right away if:  Your pain gets worse.  Your bloating becomes very bad.  You have a fever or chills, and your symptoms suddenly get worse.  You vomit.  You have bowel movements that are bloody or black.  You have bleeding from your rectum. Summary  Diverticulosis is a condition that develops when small pouches (diverticula) form in the wall of the large intestine (colon).  You may have a few pouches or many of them.  This condition is most often diagnosed during an exam for other colon problems.  Treatment may include increasing the fiber in your diet, taking supplements, or taking medicines. This information is not intended to  replace advice given to you by your health care provider. Make sure you discuss any questions you have with your health care provider. Document Revised: 12/02/2018 Document Reviewed: 12/02/2018 Elsevier Patient Education  [place].

## 2019-10-21 NOTE — Op Note (Signed)
[facility] Patient Name: Julia Martinez Procedure Date: 10/21/2019 1:19 PM MRN: [id number] Date of Birth: 26-Sep-1950 Attending MD: Norvel Richards , MD CSN: [id number] Age: 69 Admit Type: Outpatient Procedure:                ileo-colonoscopy with biopsy Indications:              Gastrointestinal occult blood loss; iron deficiency                            anemia; small bowel capsule retention. Providers:                Norvel Richards, MD, Jeanann Lewandowsky. Sharon Seller, RN,                            Nelma Rothman, Technician Referring MD:              Medicines:                Midazolam 6 mg IV, Meperidine 40 mg IV Complications:            No immediate complications. Estimated Blood Loss:     Estimated blood loss was minimal. Procedure:                After obtaining informed consent, the colonoscope                            was passed under direct vision. Throughout the                            procedure, the patient's blood pressure, pulse, and                            oxygen saturations were monitored continuously. The                            PCF-H190DL ([id number]) scope was introduced through                            the anus and advanced to the 30 cm into the ileum. Scope In: 1:30:59 PM Scope Out: 2:30:35 PM Scope Withdrawal Time: 0 hours 54 minutes 50 seconds  Total Procedure Duration: 0 hours 59 minutes 36 seconds  Findings:      The perianal and digital rectal examinations were normal. Scattered       medium size diverticula throughout the colon. Abnormal ileocecal valve       with deformity scar, gaping "fishmouth" appearance. Utilizing the       pediatric colonoscope, the TI was intubated and scope was advanced       proximally 20 cm up. There was some cobblestoning and some subtle       erosions along with small bowel diverticula involving this segment of       the GI tract. I could not go further due to looping and some       narrowing/resistance seen in the  lumen upstream. I withdrew the       pediatric colonoscope from the patient and obtained the ultraslim       colonoscope and easily advanced the  scope back to the ileocecal valve. I       advanced it into the terminal ileum and made it up to approximately 30       cm where I encountered tubular narrowing with an acute narrowing ahead       with a proximal narrowing down to about 6 mm. I was unable to advance       the scope further. Please note, beginning at 20cm up into the distal TI,       there were a couple of good sized 1 to 1.5 cm ulcers. Please see photos.       These ulcerated areas were biopsied.      I did not see the retained capsule in the colon or the examined terminal       ileum Impression:               Pancolonic diverticulosis. Abnormal ileocecal valve                            and terminal ileum. Mucosal ulceration with                            stricture in the TI consistent with active                            inflammatory bowel disease i.e. Crohn's ileitis.                            Ongoing capsule retention. This nice lady continues                            to be devoid of any GI symptoms. She does have                            profound iron deficiency anemia/Hemoccult positive                            stool is virtually her only symptoms of ileitis Moderate Sedation:      Moderate (conscious) sedation was administered by the endoscopy nurse       and supervised by the endoscopist. The following parameters were       monitored: oxygen saturation, heart rate, blood pressure, respiratory       rate, EKG, adequacy of pulmonary ventilation, and response to care.       Total physician intraservice time was 65 minutes. Recommendation:           - Patient has a contact number available for                            emergencies. The signs and symptoms of potential                            delayed complications were discussed with the                             patient. Return to normal activities tomorrow.  Written discharge instructions were provided to the                            patient.                           Patient discontinue Entocort. Follow-up on                            pathology. Anticipate initiating biologic therapy                            rather than 6?"MP given patient's initial                            preferences. Will need TB skin testing and                            hepatitis B surface antigen assessment in the near                            future                           I am skeptical that a formal retrograde enteroscopy                            at a referral center would yield delivery of the                            capsule. I will reach out to IBD experts at the                            [facility] next week and discussed the case.                            Otherwise, may be looking at an elective surgical                            consultation in the near future. Procedure Code(s):        --- Professional ---                           [phone], Colonoscopy, flexible; diagnostic, including                            collection of specimen(s) by brushing or washing,                            when performed (separate procedure)                           99153, Moderate sedation; each additional 15  minutes intraservice time                           99153, Moderate sedation; each additional 15                            minutes intraservice time                           99153, Moderate sedation; each additional 15                            minutes intraservice time                           G0500, Moderate sedation services provided by the                            same physician or other qualified health care                            professional performing a gastrointestinal                            endoscopic service that sedation  supports,                            requiring the presence of an independent trained                            observer to assist in the monitoring of the                            patient's level of consciousness and physiological                            status; initial 15 minutes of intra-service time;                            patient age 76 years or older (additional time may                            be reported with [phone], as appropriate) Diagnosis Code(s):        --- Professional ---                           R19.5, Other fecal abnormalities CPT copyright 2019 American Medical Association. All rights reserved. The codes documented in this report are preliminary and upon coder review may  be revised to meet current compliance requirements. Cristopher Estimable. Jennifermarie Franzen, MD Norvel Richards, MD 10/21/2019 5:04:08 PM This report has been signed electronically. Number of Addenda: 0

## 2019-10-21 NOTE — Interval H&P Note (Signed)
History and Physical Interval Note:  10/21/2019 1:21 PM  Julia Martinez  has presented today for surgery, with the diagnosis of iron deficiency anemia, abnormal small bowel, retained capsule.  The various methods of treatment have been discussed with the patient and family. After consideration of risks, benefits and other options for treatment, the patient has consented to  Procedure(s) with comments: COLONOSCOPY (N/A) - 2:15pm as a surgical intervention.  The patient's history has been reviewed, patient examined, no change in status, stable for surgery.  I have reviewed the patient's chart and labs.  Questions were answered to the patient's satisfaction.     Julia Martinez  No change.  KUB just performed.  Capsule again noted.  Discussed with Dr. Darcella Martinez.  She has a right lying sigmoid and rectum capsule projects lower midline.  It is possible he could be in the sigmoid colon but more likely it remains in the distal small bowel.  Ileocolonoscopy today per plan with capsule extraction as feasible.  There is no 100% guarantee I will be able to extract the capsule if it remains in the small bowel. The risks, benefits, limitations, alternatives and imponderables have been reviewed with the patient. Questions have been answered. All parties are agreeable.

## 2019-10-25 LAB — SURGICAL PATHOLOGY

## 2019-10-26 ENCOUNTER — Other Ambulatory Visit: Payer: Self-pay | Admitting: *Deleted

## 2019-10-26 ENCOUNTER — Telehealth: Payer: Self-pay

## 2019-10-26 DIAGNOSIS — K289 Gastrojejunal ulcer, unspecified as acute or chronic, without hemorrhage or perforation: Secondary | ICD-10-CM

## 2019-10-26 DIAGNOSIS — K633 Ulcer of intestine: Secondary | ICD-10-CM

## 2019-10-26 DIAGNOSIS — Z1159 Encounter for screening for other viral diseases: Secondary | ICD-10-CM

## 2019-10-26 DIAGNOSIS — R109 Unspecified abdominal pain: Secondary | ICD-10-CM

## 2019-10-26 NOTE — Telephone Encounter (Signed)
Looked that question up in up to date.  Latest says pts need to be checked for latent TB;  I think the blood test should be OK

## 2019-10-26 NOTE — Telephone Encounter (Addendum)
Called pt and made her aware of RMR's recommendations.  Pt requested to have labs drawn through Dr. Juel Burrow office. Called them and they said that they do accept outside lab orders and they use [facility].  They said that they could only do PPD test through blood work right now.  They are unable to do PPD skin test.  Is it ok to order the blood test instead of skin test?

## 2019-10-26 NOTE — Telephone Encounter (Signed)
-----   Message from Daneil Dolin, MD sent at 10/25/2019  1:10 PM EDT ----- Need hepatis b surface antigen and ppd skin test in preparation for biologic therapy for Crohns disease

## 2019-10-26 NOTE — Telephone Encounter (Signed)
Lmom for pt to call me back. 

## 2019-10-27 NOTE — Telephone Encounter (Signed)
Called pt and informed her that I spoke to Dr. Juel Burrow office and they can do labs for [place].  Pt made aware that she will be getting Hep B surface antigen and PPD blood test instead of skin test.  Pt voiced understanding.

## 2019-10-31 DIAGNOSIS — F419 Anxiety disorder, unspecified: Secondary | ICD-10-CM | POA: Diagnosis not present

## 2019-10-31 DIAGNOSIS — H9201 Otalgia, right ear: Secondary | ICD-10-CM | POA: Diagnosis not present

## 2019-10-31 DIAGNOSIS — D519 Vitamin B12 deficiency anemia, unspecified: Secondary | ICD-10-CM | POA: Diagnosis not present

## 2019-10-31 DIAGNOSIS — D508 Other iron deficiency anemias: Secondary | ICD-10-CM | POA: Diagnosis not present

## 2019-10-31 DIAGNOSIS — D509 Iron deficiency anemia, unspecified: Secondary | ICD-10-CM | POA: Diagnosis not present

## 2019-10-31 DIAGNOSIS — F410 Panic disorder [episodic paroxysmal anxiety] without agoraphobia: Secondary | ICD-10-CM | POA: Diagnosis not present

## 2019-10-31 DIAGNOSIS — D518 Other vitamin B12 deficiency anemias: Secondary | ICD-10-CM | POA: Diagnosis not present

## 2019-10-31 DIAGNOSIS — F411 Generalized anxiety disorder: Secondary | ICD-10-CM | POA: Diagnosis not present

## 2019-10-31 DIAGNOSIS — B3789 Other sites of candidiasis: Secondary | ICD-10-CM | POA: Diagnosis not present

## 2019-10-31 DIAGNOSIS — E782 Mixed hyperlipidemia: Secondary | ICD-10-CM | POA: Diagnosis not present

## 2019-10-31 DIAGNOSIS — I10 Essential (primary) hypertension: Secondary | ICD-10-CM | POA: Diagnosis not present

## 2019-11-10 ENCOUNTER — Telehealth: Payer: Self-pay | Admitting: *Deleted

## 2019-11-10 NOTE — Telephone Encounter (Signed)
Hepatitis B surface antigen and TB testing negative.  Lets proceed with getting Humira -get it started

## 2019-11-10 NOTE — Telephone Encounter (Signed)
Received results of Quantiferon-TB Gold Plus and HBsAg Screen yesterday evening from Dr. Juel Burrow office.  Both results were negative.  RMR called and AM informed him of results.  Results placed in RMR's box for review and to be signed off.  FYI for chart and RMR.

## 2019-11-14 NOTE — Telephone Encounter (Signed)
Will need to discuss Medication strength, dosage, how often pt is to receive Huirma when RMR returns to office prior to getting Humira started for pt.

## 2019-11-22 NOTE — Telephone Encounter (Signed)
JL,  do you have instructions for Humira on this pt?

## 2019-11-22 NOTE — Telephone Encounter (Signed)
Dr.Rourk- per Dina Rich (Humira) the normal dosage for both crohns and UC patients is:  Day 1:  2- 20m/0.8ml injection pens Day 15: 1- 841m.08ml injection pen Day 29: 1- 4013m.4ml injection pen  Maintenance dose: 1- 24m33m4ml injection pen every other week.   Is this the dosage and schedule you want? Or do you want something different?

## 2019-11-23 NOTE — Telephone Encounter (Addendum)
The information provided by is correct.   I personally like to do the alternate start up dose that is approved as follows.   Day 1: Inject one 55m/0.8ml pen. Day 2: Inject one 832m0.8ml pen. Day 15: Inject one 8035m.8ml pen. Then starting Day 29: Inject 29m76m4ml every other week.   The start up kit will contain three 80mg75ms and then you have to start the maintenance RX on day 29 (29mg/37ml)

## 2019-11-23 NOTE — Telephone Encounter (Signed)
Magda Paganini, Dr.Rourk is requesting that you review this.

## 2019-11-23 NOTE — Telephone Encounter (Signed)
Routing to [place]. Please see instructions from [place].

## 2019-11-23 NOTE — Telephone Encounter (Signed)
Noted, will submit paper work for Humira.

## 2019-11-23 NOTE — Telephone Encounter (Signed)
That sounds good.  Thanks for the input.

## 2019-11-24 NOTE — Telephone Encounter (Signed)
Paper work completed.

## 2019-11-24 NOTE — Telephone Encounter (Signed)
Paper work has been completed. Putting in LSL to sign and return. Paperwork will be faxed when received.

## 2019-11-29 NOTE — Telephone Encounter (Signed)
Paperwork was faxed on 11/25/19. Received a fax from [facility] with a request of last ov notes. Ov notes were faxed on 11/29/19. Waiting on a response from [employer].

## 2019-12-02 NOTE — Telephone Encounter (Signed)
Received a denial letter from pts insurance company and [employer] on 12-01-2019. After submitting extra paperwork requested by pts insurance plan, pts insurance approved medication.   Called [employer] to discuss the denial letter received. I notified that I did get an approval today 12/02/2019 from pts insurance company. They asked me to fax the approval letter and they will contact me once reviewed. Approval letter was faxed to [employer] as directed.

## 2019-12-08 NOTE — Telephone Encounter (Signed)
Noted  

## 2019-12-08 NOTE — Telephone Encounter (Addendum)
Received a letter from [place]. Pt's copay will be $3,055.06. I reached out to [employer] and they've tried to reach pt and haven't been able to speak with pt.   Called pt and let her know that the Humira was approved but the copay cost is $3,055.06. pt is aware that [employer] has tried to reach her but the copay is too expensive. Pt is aware that I'm send patient assistance paperwork in the mail to see if medication can be covered. Pt is out of town and will be for 1 more week. Pt will fill out forms and bring financial forms with it.

## 2019-12-08 NOTE — Telephone Encounter (Signed)
FYI routing to LSL

## 2019-12-28 ENCOUNTER — Telehealth: Payer: Self-pay | Admitting: Internal Medicine

## 2019-12-28 NOTE — Telephone Encounter (Signed)
Daughter, Threasa Beards, called to follow up on patient's Humira. Please call [phone]

## 2019-12-28 NOTE — Telephone Encounter (Signed)
Form completed and given back to [place].

## 2019-12-28 NOTE — Telephone Encounter (Signed)
Pt returned pt assistance forms in. Paper work will be placed on Neil Crouch, PA desk to sign.

## 2019-12-28 NOTE — Telephone Encounter (Signed)
Lmom, waiting on a return call.  

## 2019-12-28 NOTE — Telephone Encounter (Signed)
Forms faxed to [employer] at [phone]. Waiting on a response.

## 2019-12-29 ENCOUNTER — Telehealth: Payer: Self-pay | Admitting: Internal Medicine

## 2019-12-29 NOTE — Telephone Encounter (Signed)
Pt was returning call from Derrick Ravel, [place]. I told her Elmo Putt was on another call. Patient said she was driving and for the nurse to call her back. [phone]

## 2019-12-29 NOTE — Telephone Encounter (Signed)
Spoke with pt. Pt is aware that paper work was faxed and we're waiting on a response from pt assistance.

## 2019-12-29 NOTE — Telephone Encounter (Signed)
Left a detailed message for pt. Pts paper work was sent to pt assistance company. Waiting on a response from pt assistance.

## 2019-12-29 NOTE — Telephone Encounter (Signed)
Pt was returning call from AM. [phone]

## 2020-01-02 ENCOUNTER — Other Ambulatory Visit: Payer: Self-pay

## 2020-01-02 ENCOUNTER — Inpatient Hospital Stay (HOSPITAL_COMMUNITY): Payer: Medicare Other

## 2020-01-02 ENCOUNTER — Inpatient Hospital Stay (HOSPITAL_COMMUNITY): Payer: Medicare Other | Attending: Hematology

## 2020-01-02 DIAGNOSIS — F329 Major depressive disorder, single episode, unspecified: Secondary | ICD-10-CM | POA: Insufficient documentation

## 2020-01-02 DIAGNOSIS — G4733 Obstructive sleep apnea (adult) (pediatric): Secondary | ICD-10-CM | POA: Diagnosis not present

## 2020-01-02 DIAGNOSIS — E538 Deficiency of other specified B group vitamins: Secondary | ICD-10-CM

## 2020-01-02 DIAGNOSIS — I471 Supraventricular tachycardia: Secondary | ICD-10-CM | POA: Diagnosis not present

## 2020-01-02 DIAGNOSIS — H25013 Cortical age-related cataract, bilateral: Secondary | ICD-10-CM | POA: Diagnosis not present

## 2020-01-02 DIAGNOSIS — H25043 Posterior subcapsular polar age-related cataract, bilateral: Secondary | ICD-10-CM | POA: Diagnosis not present

## 2020-01-02 DIAGNOSIS — D509 Iron deficiency anemia, unspecified: Secondary | ICD-10-CM | POA: Insufficient documentation

## 2020-01-02 DIAGNOSIS — I10 Essential (primary) hypertension: Secondary | ICD-10-CM | POA: Diagnosis not present

## 2020-01-02 DIAGNOSIS — H40013 Open angle with borderline findings, low risk, bilateral: Secondary | ICD-10-CM | POA: Diagnosis not present

## 2020-01-02 DIAGNOSIS — H2513 Age-related nuclear cataract, bilateral: Secondary | ICD-10-CM | POA: Diagnosis not present

## 2020-01-02 DIAGNOSIS — D472 Monoclonal gammopathy: Secondary | ICD-10-CM | POA: Diagnosis not present

## 2020-01-02 LAB — CBC WITH DIFFERENTIAL/PLATELET
Abs Immature Granulocytes: 0.11 10*3/uL — ABNORMAL HIGH (ref 0.00–0.07)
Basophils Absolute: 0.1 10*3/uL (ref 0.0–0.1)
Basophils Relative: 1 %
Eosinophils Absolute: 0 10*3/uL (ref 0.0–0.5)
Eosinophils Relative: 0 %
HCT: 46.4 % — ABNORMAL HIGH (ref 36.0–46.0)
Hemoglobin: 15 g/dL (ref 12.0–15.0)
Immature Granulocytes: 1 %
Lymphocytes Relative: 11 %
Lymphs Abs: 1.2 10*3/uL (ref 0.7–4.0)
MCH: 30.2 pg (ref 26.0–34.0)
MCHC: 32.3 g/dL (ref 30.0–36.0)
MCV: 93.5 fL (ref 80.0–100.0)
Monocytes Absolute: 0.5 10*3/uL (ref 0.1–1.0)
Monocytes Relative: 5 %
Neutro Abs: 8.9 10*3/uL — ABNORMAL HIGH (ref 1.7–7.7)
Neutrophils Relative %: 82 %
Platelets: 235 10*3/uL (ref 150–400)
RBC: 4.96 MIL/uL (ref 3.87–5.11)
RDW: 14.3 % (ref 11.5–15.5)
WBC: 10.8 10*3/uL — ABNORMAL HIGH (ref 4.0–10.5)
nRBC: 0 % (ref 0.0–0.2)

## 2020-01-02 LAB — COMPREHENSIVE METABOLIC PANEL
ALT: 18 U/L (ref 0–44)
AST: 12 U/L — ABNORMAL LOW (ref 15–41)
Albumin: 3.5 g/dL (ref 3.5–5.0)
Alkaline Phosphatase: 67 U/L (ref 38–126)
Anion gap: 7 (ref 5–15)
BUN: 21 mg/dL (ref 8–23)
CO2: 28 mmol/L (ref 22–32)
Calcium: 10.9 mg/dL — ABNORMAL HIGH (ref 8.9–10.3)
Chloride: 103 mmol/L (ref 98–111)
Creatinine, Ser: 0.9 mg/dL (ref 0.44–1.00)
GFR calc Af Amer: >60 mL/min (ref >60)
GFR calc non Af Amer: >60 mL/min (ref >60)
Glucose, Bld: 168 mg/dL — ABNORMAL HIGH (ref 70–99)
Potassium: 4.3 mmol/L (ref 3.5–5.1)
Sodium: 138 mmol/L (ref 135–145)
Total Bilirubin: 0.8 mg/dL (ref 0.3–1.2)
Total Protein: 7.4 g/dL (ref 6.5–8.1)

## 2020-01-02 LAB — IRON AND TIBC
Iron: 63 ug/dL (ref 28–170)
Saturation Ratios: 17 % (ref 10.4–31.8)
TIBC: 379 ug/dL (ref 250–450)
UIBC: 316 ug/dL

## 2020-01-02 LAB — FERRITIN: Ferritin: 36 ng/mL (ref 11–307)

## 2020-01-02 LAB — VITAMIN B12: Vitamin B-12: 210 pg/mL (ref 180–914)

## 2020-01-03 LAB — PROTEIN ELECTROPHORESIS, SERUM
A/G Ratio: 0.9 (ref 0.7–1.7)
Albumin ELP: 3.3 g/dL (ref 2.9–4.4)
Alpha-1-Globulin: 0.3 g/dL (ref 0.0–0.4)
Alpha-2-Globulin: 0.7 g/dL (ref 0.4–1.0)
Beta Globulin: 1 g/dL (ref 0.7–1.3)
Gamma Globulin: 1.5 g/dL (ref 0.4–1.8)
Globulin, Total: 3.5 g/dL (ref 2.2–3.9)
M-Spike, %: 1.3 g/dL — ABNORMAL HIGH
Total Protein ELP: 6.8 g/dL (ref 6.0–8.5)

## 2020-01-03 LAB — KAPPA/LAMBDA LIGHT CHAINS
Kappa free light chain: 18.9 mg/L (ref 3.3–19.4)
Kappa, lambda light chain ratio: 1.64 (ref 0.26–1.65)
Lambda free light chains: 11.5 mg/L (ref 5.7–26.3)

## 2020-01-04 NOTE — Telephone Encounter (Signed)
Received a letter from [facility] [employer], pt has been approved through 05/08/21. Spoke with pt. Pt is aware of approval. The next step per the letter received, says they will do a review with the pharmacy team first and then they will contact pt about delivery. Pt is aware.

## 2020-01-09 ENCOUNTER — Other Ambulatory Visit: Payer: Self-pay

## 2020-01-09 ENCOUNTER — Inpatient Hospital Stay (HOSPITAL_BASED_OUTPATIENT_CLINIC_OR_DEPARTMENT_OTHER): Payer: Medicare Other | Admitting: Hematology

## 2020-01-09 ENCOUNTER — Inpatient Hospital Stay (HOSPITAL_COMMUNITY): Payer: Medicare Other

## 2020-01-09 VITALS — BP 154/69 | HR 67 | Temp 97.1°F | Resp 18 | Wt 177.8 lb

## 2020-01-09 DIAGNOSIS — D500 Iron deficiency anemia secondary to blood loss (chronic): Secondary | ICD-10-CM | POA: Diagnosis not present

## 2020-01-09 DIAGNOSIS — D472 Monoclonal gammopathy: Secondary | ICD-10-CM | POA: Diagnosis not present

## 2020-01-09 DIAGNOSIS — I10 Essential (primary) hypertension: Secondary | ICD-10-CM | POA: Diagnosis not present

## 2020-01-09 DIAGNOSIS — I471 Supraventricular tachycardia: Secondary | ICD-10-CM | POA: Diagnosis not present

## 2020-01-09 DIAGNOSIS — F329 Major depressive disorder, single episode, unspecified: Secondary | ICD-10-CM | POA: Diagnosis not present

## 2020-01-09 DIAGNOSIS — G4733 Obstructive sleep apnea (adult) (pediatric): Secondary | ICD-10-CM | POA: Diagnosis not present

## 2020-01-09 DIAGNOSIS — D509 Iron deficiency anemia, unspecified: Secondary | ICD-10-CM | POA: Diagnosis not present

## 2020-01-09 LAB — VITAMIN D 25 HYDROXY (VIT D DEFICIENCY, FRACTURES): Vit D, 25-Hydroxy: 22.03 ng/mL — ABNORMAL LOW (ref 30–100)

## 2020-01-09 NOTE — Patient Instructions (Signed)
[place] at [facility] Discharge Instructions  You were seen today by Dr. Delton Coombes. He went over your recent results. You had labs drawn for further analysis. Dr. Delton Coombes will see you back in 4 months for labs and follow up.   Thank you for choosing [place] at [facility] to provide your oncology and hematology care.  To afford each patient quality time with our provider, please arrive at least 15 minutes before your scheduled appointment time.   If you have a lab appointment with the [facility] please come in thru the Main Entrance and check in at the main information desk  You need to re-schedule your appointment should you arrive 10 or more minutes late.  We strive to give you quality time with our providers, and arriving late affects you and other patients whose appointments are after yours.  Also, if you no show three or more times for appointments you may be dismissed from the clinic at the providers discretion.     Again, thank you for choosing [facility].  Our hope is that these requests will decrease the amount of time that you wait before being seen by our physicians.       _____________________________________________________________  Should you have questions after your visit to [facility], please contact our office at [phone] between the hours of 8:00 a.m. and 4:30 p.m.  Voicemails left after 4:00 p.m. will not be returned until the following business day.  For prescription refill requests, have your pharmacy contact our office and allow 72 hours.    Cancer Center Support Programs:   > Cancer Support Group  2nd Tuesday of the month 1pm-2pm, Journey Room

## 2020-01-09 NOTE — Progress Notes (Signed)
[place], [place] [address]   CLINIC:  Medical Oncology/Hematology  PCP:  Celene Squibb, MD [address] [place] [address]  [phone]  REASON FOR VISIT:  Follow-up for IgG kappa MGUS and iron deficiency anemia  PRIOR THERAPY: None  CURRENT THERAPY: Intermittent Feraheme  INTERVAL HISTORY:  Julia Martinez, a 69 y.o. female, returns for routine follow-up for her IgG kappa MGUS and iron deficiency anemia. [name] was last seen on 10/03/2019.  Today she is accompanied by her daughter. She denies taking calcium or vitamin D; she has never been informed about having elevated calcium. She does not drink milk. She denies having any bone pains. She denies having N/V/D, changes in BM's, or hematochezia or hematuria. She does not take iron tablets.   REVIEW OF SYSTEMS:  Review of Systems  Constitutional: Negative for appetite change and fatigue.  Gastrointestinal: Negative for blood in stool, diarrhea, nausea and vomiting.  Genitourinary: Negative for hematuria.   Musculoskeletal: Negative for arthralgias.  Psychiatric/Behavioral: Positive for depression. The patient is nervous/anxious.   All other systems reviewed and are negative.   PAST MEDICAL/SURGICAL HISTORY:  Past Medical History:  Diagnosis Date  . Anemia   . Depression   . Essential hypertension   . OSA (obstructive sleep apnea)    Mild by sleep study 2019  . PSVT (paroxysmal supraventricular tachycardia) ([facility])   . Ulcer of small intestine    Past Surgical History:  Procedure Laterality Date  . BIOPSY  10/21/2019   Procedure: BIOPSY;  Surgeon: Daneil Dolin, MD;  Location: [facility];  Service: Endoscopy;;  small bowel  . CESAREAN SECTION    . COLONOSCOPY  2009   single external hemorrhoid tag, otherwise normal rectum. Numerous left-sided diverticula, abnormal IC valve and TI suggestive of Crohn's disease s/p biopsy. Biopsies with non-specific ulcerations. CTE  then revealing chronic inflammatory change of distal TI, concerning for SB Crohn's. Did not start therapy due to absence of clinical symptoms and recommended [facility] evaluation.  . COLONOSCOPY N/A 07/21/2017   Fields: Multiple ileal diverticulum, moderate diverticulosis in the entire examined colon, small external hemorrhoids.  . COLONOSCOPY N/A 10/21/2019   Procedure: COLONOSCOPY;  Surgeon: Daneil Dolin, MD;  Location: [facility];  Service: Endoscopy;  Laterality: N/A;  2:15pm  . ESOPHAGOGASTRODUODENOSCOPY N/A 07/20/2017   Fields: Normal esophagus, small hiatal hernia, normal examined duodenum.  No blood in the upper GI tract.  Marland Kitchen GIVENS CAPSULE STUDY N/A 08/30/2019   Fields: Small bowel AVMs, erosions and ulcerated distal small bowel, likely in the distal jejunum/proximal ileum, study not complete the cecum.  . WISDOM TOOTH EXTRACTION      SOCIAL HISTORY:  Social History   Socioeconomic History  . Marital status: Married    Spouse name: Not on file  . Number of children: 3  . Years of education: Not on file  . Highest education level: Not on file  Occupational History  . Occupation: paraprofessional  Tobacco Use  . Smoking status: Never Smoker  . Smokeless tobacco: Never Used  Vaping Use  . Vaping Use: Never used  Substance and Sexual Activity  . Alcohol use: No  . Drug use: No  . Sexual activity: Not Currently  Other Topics Concern  . Not on file  Social History Narrative  . Not on file   Social Determinants of Health   Financial Resource Strain:   . Difficulty of Paying Living Expenses:  Not on file  Food Insecurity:   . Worried About Charity fundraiser in the Last Year: Not on file  . Ran Out of Food in the Last Year: Not on file  Transportation Needs:   . Lack of Transportation (Medical): Not on file  . Lack of Transportation (Non-Medical): Not on file  Physical Activity:   . Days of Exercise per Week: Not on file  . Minutes of Exercise per Session: Not on file    Stress:   . Feeling of Stress : Not on file  Social Connections:   . Frequency of Communication with Friends and Family: Not on file  . Frequency of Social Gatherings with Friends and Family: Not on file  . Attends Religious Services: Not on file  . Active Member of Clubs or Organizations: Not on file  . Attends Archivist Meetings: Not on file  . Marital Status: Not on file  Intimate Partner Violence:   . Fear of Current or Ex-Partner: Not on file  . Emotionally Abused: Not on file  . Physically Abused: Not on file  . Sexually Abused: Not on file    FAMILY HISTORY:  Family History  Problem Relation Age of Onset  . Depression Mother   . Ulcers Mother   . Emphysema Father   . Prader-Willi syndrome Son   . Colon cancer Neg Hx   . Colon polyps Neg Hx     CURRENT MEDICATIONS:  Current Outpatient Medications  Medication Sig Dispense Refill  . brimonidine (ALPHAGAN) 0.2 % ophthalmic solution 1 drop 2 (two) times daily.    . budesonide (ENTOCORT EC) 3 MG 24 hr capsule Take 9 mg by mouth daily.     . cyanocobalamin (,VITAMIN B-12,) 1000 MCG/ML injection Inject into the muscle.    . Cyanocobalamin 1000 MCG/ML KIT Inject 1 mL as directed once a week.    . diltiazem (CARDIZEM CD) 240 MG 24 hr capsule Take 240 mg by mouth daily.    Marland Kitchen escitalopram (LEXAPRO) 5 MG tablet TAKE ONE TABLET BY MOUTH NIGHTLY, START WITH ONE-HALF TABLET    . HUMIRA PEN-CD/UC/HS STARTER 80 MG/0.8ML PNKT Inject into the skin.    . metoprolol succinate (TOPROL-XL) 100 MG 24 hr tablet Take 1 tablet (100 mg total) by mouth daily. Take with or immediately following a meal. 90 tablet 3   No current facility-administered medications for this visit.    ALLERGIES:  Allergies  Allergen Reactions  . Penicillins Nausea Only    PHYSICAL EXAM:  Performance status (ECOG): 1 - Symptomatic but completely ambulatory  Vitals:   01/09/20 1507  BP: (!) 154/69  Pulse: 67  Resp: 18  Temp: (!) 97.1 F (36.2 C)   SpO2: 98%   Wt Readings from Last 3 Encounters:  01/09/20 177 lb 12.8 oz (80.6 kg)  10/21/19 172 lb (78 kg)  10/10/19 175 lb 6.4 oz (79.6 kg)   Physical Exam Vitals reviewed.  Constitutional:      Appearance: Normal appearance. She is obese.  Cardiovascular:     Rate and Rhythm: Normal rate and regular rhythm.     Pulses: Normal pulses.     Heart sounds: Murmur heard.   Pulmonary:     Effort: Pulmonary effort is normal.     Breath sounds: Normal breath sounds.  Abdominal:     Palpations: Abdomen is soft. There is no hepatomegaly, splenomegaly or mass.     Tenderness: There is no abdominal tenderness.     Hernia:  No hernia is present.  Neurological:     General: No focal deficit present.     Mental Status: She is alert and oriented to person, place, and time.  Psychiatric:        Mood and Affect: Mood normal.        Behavior: Behavior normal.     LABORATORY DATA:  I have reviewed the labs as listed.  CBC Latest Ref Rng & Units 01/02/2020 10/03/2019 09/08/2019  WBC 4.0 - 10.5 K/uL 10.8(H) 11.2(H) 8.1  Hemoglobin 12.0 - 15.0 g/dL 15.0 13.6 10.7(L)  Hematocrit 36 - 46 % 46.4(H) 43.6 36.9  Platelets 150 - 400 K/uL 235 253 301   CMP Latest Ref Rng & Units 01/02/2020 08/31/2019 08/30/2019  Glucose 70 - 99 mg/dL 168(H) 110(H) 168(H)  BUN 8 - 23 mg/dL 21 12 20   Creatinine 0.44 - 1.00 mg/dL 0.90 0.84 1.02(H)  Sodium 135 - 145 mmol/L 138 138 137  Potassium 3.5 - 5.1 mmol/L 4.3 3.9 4.0  Chloride 98 - 111 mmol/L 103 108 108  CO2 22 - 32 mmol/L 28 24 24   Calcium 8.9 - 10.3 mg/dL 10.9(H) 9.8 9.3  Total Protein 6.5 - 8.1 g/dL 7.4 - -  Total Bilirubin 0.3 - 1.2 mg/dL 0.8 - -  Alkaline Phos 38 - 126 U/L 67 - -  AST 15 - 41 U/L 12(L) - -  ALT 0 - 44 U/L 18 - -      Component Value Date/Time   RBC 4.96 01/02/2020 1400   MCV 93.5 01/02/2020 1400   MCH 30.2 01/02/2020 1400   MCHC 32.3 01/02/2020 1400   RDW 14.3 01/02/2020 1400   LYMPHSABS 1.2 01/02/2020 1400   MONOABS 0.5  01/02/2020 1400   EOSABS 0.0 01/02/2020 1400   BASOSABS 0.1 01/02/2020 1400   Lab Results  Component Value Date   LDH 119 09/15/2019   LDH 129 09/08/2019   Lab Results  Component Value Date   TOTALPROTELP 6.8 01/02/2020   ALBUMINELP 3.3 01/02/2020   A1GS 0.3 01/02/2020   A2GS 0.7 01/02/2020   BETS 1.0 01/02/2020   GAMS 1.5 01/02/2020   MSPIKE 1.3 (H) 01/02/2020   SPEI Comment 01/02/2020    Lab Results  Component Value Date   KPAFRELGTCHN 18.9 01/02/2020   LAMBDASER 11.5 01/02/2020   KAPLAMBRATIO 1.64 01/02/2020   Lab Results  Component Value Date   TIBC 379 01/02/2020   TIBC 419 08/30/2019   FERRITIN 36 01/02/2020   FERRITIN 2 (L) 08/30/2019   IRONPCTSAT 17 01/02/2020   IRONPCTSAT 2 (L) 08/30/2019    DIAGNOSTIC IMAGING:  I have independently reviewed the scans and discussed with the patient. No results found.   ASSESSMENT:  1.  IgG kappa MGUS: -Anemia work-up showed 1.7 g of IgG kappa monoclonal gammopathy.  Kappa light chain was elevated at 24.5 with ratio of 1.93.  LDH was normal.  Beta-2 microglobulin was 2.6.  -24-hour urine shows total protein 282 mg.  UPEP, urine immunofixation and free light chains were negative. -Skeletal survey on 09/21/2019 did not show any lytic lesions. -She does not have "CRAB" features.  We discussed the normal prognosis of MGUS with 1% of patients transforming to myeloma per year.  Will consider bone marrow biopsy if there is any significant changes in the labs. -We will follow up in 3 months with labs.  2.  Normocytic anemia: -Recent admission to the hospital on 08/30/2019 with hemoglobin 7.6 and dark stools, status post 1 unit PRBC. -Colonoscopy on  07/21/2017 showing multiple ileal diverticula, moderate diverticulosis in the entire colon, significant looping of the left colon and small external hemorrhoids. -EGD on 07/20/2017 shows normal esophagus, small hiatal hernia, normal duodenal bulb, second portion of the duodenum and third  portion of the duodenum. -CT angio abdomen and pelvis on 08/31/2019 shows diverticulosis without inflammation, cold lithiasis, duodenal diverticula, cardiomegaly.  Spleen normal. -Capsule study showed small bowel AVMs, erosions, distal small bowel ulceration and small amounts of fresh blood. -Capsule apparently stuck at the ileocecal junction.  She will have a colonoscopy by Dr. Buford Dresser to try to retrieve it. -Ferritin was 2 and percent saturation was 2 on 08/30/2019. -She received Feraheme on 09/09/2019.  Hemoglobin improved to 13.6.  She is feeling better. -We plan to repeat CBC, ferritin and iron panel in 3 months.  3.  Vitamin B12 deficiency: -Vitamin B12 was 172 with methylmalonic acid 415. -She will start taking vitamin B12 1 mg daily.  We will check B12 in 3 months.   PLAN:  1.  IgG kappa MGUS: -We reviewed labs from 01/02/2020.  M spike improved to 1.3 from 1.7 previously.  Free light chain ratio has normalized.  Kappa light chains are also normal. -However calcium is slightly increased to 10.9.  Creatinine is 0.9. -I plan to repeat myeloma labs in 4 months.  If it is stable, we will switch her to 29-monthvisits.  2.  Normocytic anemia: -Last Feraheme was on 09/09/2019. -Ferritin is 36.  Hemoglobin is 15.  Percent saturation is 17.  No need for parenteral iron therapy.  3.  Vitamin B12 deficiency: -Continue vitamin B12 1 mg daily.  B12 level is 210.  4.  Hypocalcemia: -She has mild hypercalcemia 10.9 for the first time. -She is not taking any calcium or vitamin D supplements. -We will check her vitamin D today along with intact PTH.   Orders placed this encounter:  Orders Placed This Encounter  Procedures  . PTH, intact and calcium  . Vitamin D 25 hydroxy  . CBC with Differential/Platelet  . Comprehensive metabolic panel  . Kappa/lambda light chains  . Protein electrophoresis, serum     SDerek Jack MD [phone]  I, DMilinda Antis am acting as a scribe for Dr. SSanda Linger  I, SDerek JackMD, have reviewed the above documentation for accuracy and completeness, and I agree with the above.

## 2020-01-10 ENCOUNTER — Telehealth (HOSPITAL_COMMUNITY): Payer: Self-pay | Admitting: *Deleted

## 2020-01-10 LAB — PTH, INTACT AND CALCIUM
Calcium, Total (PTH): 10.5 mg/dL — ABNORMAL HIGH (ref 8.7–10.3)
PTH: 110 pg/mL — ABNORMAL HIGH (ref 15–65)

## 2020-01-10 NOTE — Telephone Encounter (Signed)
Pt's daughter Threasa Beards called stating that her mother had seen her lab results on her mychart and was worried that something was wrong. She stated that the pt's PTH was elevated and the pt was afraid she was going to have to have surgery. I advised Threasa Beards that Dr. Delton Coombes would likely send her mother to an Endocrinologist first and have them assess the issue.   I spoke with Dr.Katragadda and he advised that we should refer the pt to Dr. Cruzita Lederer per the pt's request and have them evaluate her.   I advised Threasa Beards of above and that the referral would be sent tomorrow. She verbalized understanding,

## 2020-01-12 ENCOUNTER — Ambulatory Visit: Payer: Medicare Other

## 2020-01-12 ENCOUNTER — Ambulatory Visit: Payer: Medicare Other | Attending: Internal Medicine

## 2020-01-12 ENCOUNTER — Ambulatory Visit: Payer: Self-pay

## 2020-01-12 DIAGNOSIS — Z23 Encounter for immunization: Secondary | ICD-10-CM

## 2020-01-12 NOTE — Progress Notes (Signed)
   Covid-19 Vaccination Clinic  Name:  [place]    MRN: [id number] DOB: March 28, 1951  01/12/2020  Julia Martinez was observed post Covid-19 immunization for 15 minutes without incident. She was provided with Vaccine Information Sheet and instruction to access the V-Safe system.   Julia [place] was instructed to call 911 with any severe reactions post vaccine: Marland Kitchen Difficulty breathing  . Swelling of face and throat  . A fast heartbeat  . A bad rash all over body  . Dizziness and weakness

## 2020-01-18 ENCOUNTER — Telehealth: Payer: Self-pay | Admitting: Internal Medicine

## 2020-01-18 NOTE — Telephone Encounter (Signed)
PLEASE CALL PATIENT, SHE HAS QUESTIONS ABOUT HER HUMIRA SHIPMENT [phone]

## 2020-01-18 NOTE — Telephone Encounter (Signed)
Lmom, waiting on a return call.  

## 2020-01-18 NOTE — Telephone Encounter (Signed)
Spoke with pt. Pt wanted to make sure the company is suppose to contact her about shipment. Pt received her approval letter and will receive a call back from the company to schedule delivery. Pt will call back if she hasn't heard back from the company.

## 2020-01-19 DIAGNOSIS — H2513 Age-related nuclear cataract, bilateral: Secondary | ICD-10-CM | POA: Diagnosis not present

## 2020-01-19 DIAGNOSIS — H25013 Cortical age-related cataract, bilateral: Secondary | ICD-10-CM | POA: Diagnosis not present

## 2020-01-19 DIAGNOSIS — H25043 Posterior subcapsular polar age-related cataract, bilateral: Secondary | ICD-10-CM | POA: Diagnosis not present

## 2020-01-19 DIAGNOSIS — H40013 Open angle with borderline findings, low risk, bilateral: Secondary | ICD-10-CM | POA: Diagnosis not present

## 2020-02-20 DIAGNOSIS — H40013 Open angle with borderline findings, low risk, bilateral: Secondary | ICD-10-CM | POA: Diagnosis not present

## 2020-02-22 ENCOUNTER — Telehealth: Payer: Self-pay | Admitting: Internal Medicine

## 2020-02-22 ENCOUNTER — Other Ambulatory Visit: Payer: Self-pay

## 2020-02-22 ENCOUNTER — Other Ambulatory Visit: Payer: Self-pay | Admitting: *Deleted

## 2020-02-22 ENCOUNTER — Ambulatory Visit (HOSPITAL_COMMUNITY)
Admission: RE | Admit: 2020-02-22 | Discharge: 2020-02-22 | Disposition: A | Discharge status: Home / Self Care | Payer: Medicare Other | Source: Ambulatory Visit | Attending: Internal Medicine | Admitting: Internal Medicine

## 2020-02-22 ENCOUNTER — Encounter: Payer: Self-pay | Admitting: Internal Medicine

## 2020-02-22 DIAGNOSIS — R109 Unspecified abdominal pain: Secondary | ICD-10-CM | POA: Insufficient documentation

## 2020-02-22 DIAGNOSIS — I878 Other specified disorders of veins: Secondary | ICD-10-CM | POA: Diagnosis not present

## 2020-02-22 NOTE — Telephone Encounter (Signed)
Patient's daughter, Threasa Beards, concerned about patient with abdominal cramps recently has had 2 doses of Humira so far.  Entocort 6 mg daily.  No diarrhea.  No nausea vomiting or fever. Capsule retention is ongoing issue.  I examination revealed questionable increase intraocular  pressures query Entocort effect-I doubt.  I recommend patient decrease Entocort to 3 mg once daily starting today Lets get a KUB to assess location of capsule.  Her symptoms may be due to capsule migration.  Has inflammation is squelched with Humira, I'm optimistic capsule will pass.  We'll hold off on prescription anticholinergics for abdominal cramping for now.  Please let patient know.

## 2020-02-22 NOTE — Telephone Encounter (Signed)
KUB is just an abd xray. No appt needed

## 2020-02-22 NOTE — Telephone Encounter (Signed)
Spoke with pt. Pt is aware of Dr. Roseanne Kaufman recommendations. Pt will decrease Entocort to 3 mg daily for now.   Mindy please arrange KUB.

## 2020-02-22 NOTE — Telephone Encounter (Signed)
Julia Martinez called and she has ran out of her budesonide. Pharmacy does not have any refills. Dr. Gala Romney advised Julia Martinez to decrease Budesonide to 28m daily for now.

## 2020-02-22 NOTE — Telephone Encounter (Signed)
Noted  

## 2020-02-22 NOTE — Telephone Encounter (Signed)
Pt is aware to go to [facility] for KUB today.

## 2020-02-23 MED ORDER — BUDESONIDE 3 MG PO CPEP: 3.0000 mg | ORAL_CAPSULE | Freq: Every day | ORAL | 1 refills | Status: AC

## 2020-02-23 NOTE — Addendum Note (Signed)
Addended by: Mahala Menghini on: 02/23/2020 07:33 AM   Modules accepted: Orders

## 2020-02-23 NOTE — Telephone Encounter (Signed)
RX for Entocort 39m po daily sent to pharmacy.

## 2020-02-28 DIAGNOSIS — H2513 Age-related nuclear cataract, bilateral: Secondary | ICD-10-CM | POA: Diagnosis not present

## 2020-02-28 DIAGNOSIS — H52223 Regular astigmatism, bilateral: Secondary | ICD-10-CM | POA: Diagnosis not present

## 2020-02-28 DIAGNOSIS — H5213 Myopia, bilateral: Secondary | ICD-10-CM | POA: Diagnosis not present

## 2020-02-28 DIAGNOSIS — H524 Presbyopia: Secondary | ICD-10-CM | POA: Diagnosis not present

## 2020-03-02 ENCOUNTER — Ambulatory Visit (INDEPENDENT_AMBULATORY_CARE_PROVIDER_SITE_OTHER): Payer: Medicare Other | Admitting: Internal Medicine

## 2020-03-02 ENCOUNTER — Encounter: Payer: Self-pay | Admitting: Internal Medicine

## 2020-03-02 ENCOUNTER — Other Ambulatory Visit: Payer: Self-pay

## 2020-03-02 VITALS — BP 152/94 | HR 82 | Ht 63.0 in | Wt 175.8 lb

## 2020-03-02 DIAGNOSIS — E559 Vitamin D deficiency, unspecified: Secondary | ICD-10-CM | POA: Diagnosis not present

## 2020-03-02 DIAGNOSIS — E213 Hyperparathyroidism, unspecified: Secondary | ICD-10-CM

## 2020-03-02 NOTE — Progress Notes (Addendum)
Patient ID: Julia Martinez, female   DOB: 01-19-1951, 69 y.o.   MRN: [id number]   This visit occurred during the SARS-CoV-2 public health emergency.  Safety protocols were in place, including screening questions prior to the visit, additional usage of staff PPE, and extensive cleaning of exam room while observing appropriate contact time as indicated for disinfecting solutions.   HPI  Julia Martinez is a 69 y.o.-year-old female, referred by her PCP, Derek Jack, MD, for evaluation for and management of hypercalcemia/hyperparathyroidism. Her son, Jamoni Broadfoot, is also my patient.  Pt was dx with hypercalcemia in 12/2019.  A PTH level was also found high then.  I reviewed pt's pertinent labs: Lab Results  Component Value Date   PTH 110 (H) 01/09/2020   PTH Comment 01/09/2020   CALCIUM 10.5 (H) 01/09/2020   CALCIUM 10.9 (H) 01/02/2020   CALCIUM 9.8 08/31/2019   CALCIUM 9.3 08/30/2019   CALCIUM 9.9 07/20/2017   CALCIUM 9.4 07/08/2009   CALCIUM 9.6 10/30/2007   No history of osteoporosis or fractures.  No clinical h/o kidney stones.  However, on abdominal x-ray from 02/22/2020, there was a possible right renal calculus.  No h/o CKD. Last BUN/Cr: Lab Results  Component Value Date   BUN 21 01/02/2020   BUN 12 08/31/2019   CREATININE 0.90 01/02/2020   CREATININE 0.84 08/31/2019   Pt is not on HCTZ.  She also has vitamin D insufficiency. Reviewed vit D levels: Lab Results  Component Value Date   VD25OH 22.03 (L) 01/09/2020   Pt is not on calcium and vitamin D.  She is not on biotin.  Pt does not have a FH of hypercalcemia, pituitary tumors, thyroid cancer, or osteoporosis.   Pt. also has a history of MGUS.   She was dx'ed with Crohn's ds >> She was started on Entocort high-dose >> now tapering down and started Humira 2 weeks ago.  ROS: Constitutional: no weight gain/loss, no fatigue, no subjective hyperthermia/hypothermia, + insomnia Eyes: no blurry vision, no  xerophthalmia ENT: no sore throat, no nodules palpated in throat, no dysphagia/odynophagia, no hoarseness Cardiovascular: no CP/SOB/+ palpitations/no leg swelling Respiratory: no cough/SOB Gastrointestinal: no N/V/D/C, + AP Musculoskeletal: no muscle/joint aches Skin: no rashes Neurological: no tremors/numbness/tingling/dizziness Psychiatric: + depression/no anxiety  Past Medical History:  Diagnosis Date  . Anemia   . Depression   . Essential hypertension   . OSA (obstructive sleep apnea)    Mild by sleep study 2019  . PSVT (paroxysmal supraventricular tachycardia) ([place])   . Ulcer of small intestine    Past Surgical History:  Procedure Laterality Date  . BIOPSY  10/21/2019   Procedure: BIOPSY;  Surgeon: Daneil Dolin, MD;  [facility];  Service: Endoscopy;;  small bowel  . CESAREAN SECTION    . COLONOSCOPY  2009   single external hemorrhoid tag, otherwise normal rectum. Numerous left-sided diverticula, abnormal IC valve and TI suggestive of Crohn's disease s/p biopsy. Biopsies with non-specific ulcerations. CTE then revealing chronic inflammatory change of distal TI, concerning for SB Crohn's. Did not start therapy due to absence of clinical symptoms and recommended [facility] evaluation.  . COLONOSCOPY N/A 07/21/2017   Fields: Multiple ileal diverticulum, moderate diverticulosis in the entire examined colon, small external hemorrhoids.  . COLONOSCOPY N/A 10/21/2019   Procedure: COLONOSCOPY;  Surgeon: Daneil Dolin, MD;  [facility];  Service: Endoscopy;  Laterality: N/A;  2:15pm  . ESOPHAGOGASTRODUODENOSCOPY N/A 07/20/2017   Fields: Normal esophagus, small hiatal hernia, normal  examined duodenum.  No blood in the upper GI tract.  Marland Kitchen GIVENS CAPSULE STUDY N/A 08/30/2019   Fields: Small bowel AVMs, erosions and ulcerated distal small bowel, likely in the distal jejunum/proximal ileum, study not complete the cecum.  . WISDOM TOOTH EXTRACTION     Social History    Socioeconomic History  . Marital status: Married    Spouse name: Not on file  . Number of children: 3  . Years of education: Not on file  . Highest education level: Not on file  Occupational History  . Occupation: paraprofessional  Tobacco Use  . Smoking status: Never Smoker  . Smokeless tobacco: Never Used  Vaping Use  . Vaping Use: Never used  Substance and Sexual Activity  . Alcohol use: No  . Drug use: No  . Sexual activity: Not Currently  Other Topics Concern  . Not on file  Social History Narrative  . Not on file   Social Determinants of Health   Financial Resource Strain:   . Difficulty of Paying Living Expenses: Not on file  Food Insecurity:   . Worried About Charity fundraiser in the Last Year: Not on file  . Ran Out of Food in the Last Year: Not on file  Transportation Needs:   . Lack of Transportation (Medical): Not on file  . Lack of Transportation (Non-Medical): Not on file  Physical Activity:   . Days of Exercise per Week: Not on file  . Minutes of Exercise per Session: Not on file  Stress:   . Feeling of Stress : Not on file  Social Connections:   . Frequency of Communication with Friends and Family: Not on file  . Frequency of Social Gatherings with Friends and Family: Not on file  . Attends Religious Services: Not on file  . Active Member of Clubs or Organizations: Not on file  . Attends Archivist Meetings: Not on file  . Marital Status: Not on file  Intimate Partner Violence:   . Fear of Current or Ex-Partner: Not on file  . Emotionally Abused: Not on file  . Physically Abused: Not on file  . Sexually Abused: Not on file   Current Outpatient Medications on File Prior to Visit  Medication Sig Dispense Refill  . brimonidine (ALPHAGAN) 0.2 % ophthalmic solution 1 drop 2 (two) times daily.    . budesonide (ENTOCORT EC) 3 MG 24 hr capsule Take 1 capsule (3 mg total) by mouth daily. 30 capsule 1  . cyanocobalamin (,VITAMIN B-12,) 1000  MCG/ML injection Inject into the muscle.    . diltiazem (CARDIZEM CD) 240 MG 24 hr capsule Take 240 mg by mouth daily.    Marland Kitchen HUMIRA PEN-CD/UC/HS STARTER 80 MG/0.8ML PNKT Inject into the skin.    Marland Kitchen LATANOPROST OP Apply to eye.    . metoprolol succinate (TOPROL-XL) 100 MG 24 hr tablet Take 1 tablet (100 mg total) by mouth daily. Take with or immediately following a meal. 90 tablet 3  . escitalopram (LEXAPRO) 5 MG tablet TAKE ONE TABLET BY MOUTH NIGHTLY, START WITH ONE-HALF TABLET (Patient not taking: Reported on 03/02/2020)     No current facility-administered medications on file prior to visit.   Allergies  Allergen Reactions  . Penicillins Nausea Only   Family History  Problem Relation Age of Onset  . Depression Mother   . Ulcers Mother   . Emphysema Father   . Prader-Willi syndrome Son   . Colon cancer Neg Hx   . Colon  polyps Neg Hx   + Diabetes in her son  PE: BP (!) 152/94   Pulse 82   Ht 5' 3"  (1.6 m)   Wt 175 lb 12.8 oz (79.7 kg)   SpO2 96%   BMI 31.14 kg/m  Wt Readings from Last 3 Encounters:  03/02/20 175 lb 12.8 oz (79.7 kg)  01/09/20 177 lb 12.8 oz (80.6 kg)  10/21/19 172 lb (78 kg)   Constitutional: overweight, in NAD. + kyphosis and interscapular fat deposition. Eyes: PERRLA, EOMI, no exophthalmos ENT: moist mucous membranes, no thyromegaly, no cervical lymphadenopathy Cardiovascular: RRR, No MRG Respiratory: CTA B Gastrointestinal: abdomen soft, NT, ND, BS+ Musculoskeletal: no deformities, strength intact in all 4 Skin: moist, warm, + thin skin, + macular rash -generalized  Rash  Neurological: no tremor with outstretched hands, DTR normal in all 4  Assessment: 1. Hypercalcemia/hyperparathyroidism  Plan: Patient has had several instances of elevated calcium, with the highest level being at 10.9. An intact PTH level was also high, at 110, for a calcium of 10.5, however, at that time vitamin D was low, at 22. - She does have incidental nephrolithiasis on  abdominal CT, no osteoporosis (but she does appear to have kyphosis), no fractures. She also describes abdominal pain (Crohn's ds. And also has a retained endoscopy capsule), depression (related to her medical conditions). - I discussed with the patient about the physiology of calcium and parathyroid hormone, and possible side effects from increased PTH, including kidney stones, osteoporosis, abdominal pain, etc.  - We discussed that we need to check whether her hyperparathyroidism is primary (Familial hypercalcemic hypocalciuria or parathyroid adenoma) or secondary (to conditions like: vitamin D deficiency, calcium malabsorption, hypercalciuria, renal insufficiency, etc.). - I discussed with her that we first need to bring her vitamin D level to normal so we can further investigate the parathyroid status. I explained that in the setting of a low vitamin D, the parathyroid hormone can be elevated, which is not a pathologic finding. However, if the PTH is elevated in the setting of a normal vitamin D, we will further need to investigate her for primary or secondary hyperparathyroidism. - after we normalize the vitamin D level, we'll need to check: calcium level intact PTH ([facility]) Magnesium Phosphorus vitamin D 1,25 HO 24h urinary calcium/creatinine ratio - given instructions for urine collection - We discussed possible consequences of hyperparathyroidism: ~1/3 pts will develop complications over 15 years (OP, nephrolithiasis).  - If the tests indicate a parathyroid adenoma, she agrees with a referral to surgery.  - Criteria for parathyroid surgery are:  . Increased calcium by more than 1 mg/dL above the upper limit of normal  . Kidney ds.  . Osteoporosis (or vertebral fracture) . Age <52 years old Newer criteria (2013): Marland Kitchen High UCa >400 mg/d and increased stone risk by biochemical stone risk analysis . Presence of nephrolithiasis or nephrocalcinosis . Pt's preference!  - We may need to check a  DXA scan to see if she has osteoporosis (+ add a 33% distal radius for evaluation of cortical bone, which is predominantly affected by hyperparathyroidism).  - I will advise her about vitamin D supplement dose when the results of the vitamin D level are back. - I will see the patient back in 3 months  Component     Latest Ref Rng & Units 03/02/2020  Vitamin D, 25-Hydroxy     30.0 - 100.0 ng/mL 22.1 (L)   Vitamin D is still low. We will start a vitamin D supplement:  2000 units daily and will repeat the level in 2 months.  Philemon Kingdom, MD PhD [facility]

## 2020-03-02 NOTE — Patient Instructions (Addendum)
Please stop at the lab.  Please come back for a follow-up appointment in 3 months.  In case we need a 24 h urine collection: Patient information (Up-to-Date): Collection of a 24-hour urine specimen  - You should collect every drop of urine during each 24-hour period. It does not matter how much or little urine is passed each time, as long as every drop is collected. - Begin the urine collection in the morning after you wake up, after you have emptied your bladder for the first time. - Urinate (empty the bladder) for the first time and flush it down the toilet. Note the exact time (eg, 6:15 AM). You will begin the urine collection at this time. - Collect every drop of urine during the day and night in an empty collection bottle. Store the bottle at room temperature or in the refrigerator. - If you need to have a bowel movement, any urine passed with the bowel movement should be collected. Try not to include feces with the urine collection. If feces does get mixed in, do not try to remove the feces from the urine collection bottle. - Finish by collecting the first urine passed the next morning, adding it to the collection bottle. This should be within ten minutes before or after the time of the first morning void on the first day (which was flushed). In this example, you would try to void between 6:05 and 6:25 on the second day. - If you need to urinate one hour before the final collection time, drink a full glass of water so that you can void again at the appropriate time. If you have to urinate 20 minutes before, try to hold the urine until the proper time. - Please note the exact time of the final collection, even if it is not the same time as when collection began on day 1. - The bottle(s) may be kept at room temperature for a day or two, but should be kept cool or refrigerated for longer periods of time.   Hypercalcemia Hypercalcemia is when the level of calcium in a person's blood is above  normal. The body needs calcium to make bones and keep them strong. Calcium also helps the muscles, nerves, brain, and heart work the way they should. Most of the calcium in the body is in the bones. There is also some calcium in the blood. Hypercalcemia can happen when calcium comes out of the bones, or when the kidneys are not able to remove calcium from the blood. Hypercalcemia can be mild or severe. What are the causes? There are many possible causes of hypercalcemia. Common causes of this condition include:  Hyperparathyroidism. This is a condition in which the body produces too much parathyroid hormone. There are four parathyroid glands in your neck. These glands produce a chemical messenger (hormone) that helps the body absorb calcium from foods and helps your bones release calcium.  Certain kinds of cancer. Less common causes of hypercalcemia include:  Getting too much calcium or vitamin D from your diet.  Kidney failure.  Hyperthyroidism.  Severe dehydration.  Being on bed rest or being inactive for a long time.  Certain medicines.  Infections. What increases the risk? You are more likely to develop this condition if you:  Are female.  Are 9 years of age or older.  Have a family history of hypercalcemia. What are the signs or symptoms? Mild hypercalcemia that starts slowly may not cause symptoms. Severe, sudden hypercalcemia is more likely to cause  symptoms, such as:  Being more thirsty than usual.  Needing to urinate more often than usual.  Abdominal pain.  Nausea and vomiting.  Constipation.  Muscle pain, twitching, or weakness.  Feeling very tired. How is this diagnosed?  Hypercalcemia is usually diagnosed with a blood test. You may also have tests to help determine what is causing this condition, such as imaging tests and more blood tests. How is this treated? Treatment for hypercalcemia depends on the cause. Treatment may include:  Receiving fluids  through an IV.  Medicines that: ? Keep calcium levels steady after receiving fluids (loop diuretics). ? Keep calcium in your bones (bisphosphonates). ? Lower the calcium level in your blood.  Surgery to remove overactive parathyroid glands.  A procedure that filters your blood to correct calcium levels (hemodialysis). Follow these instructions at home:   Take over-the-counter and prescription medicines only as told by your health care provider.  Follow instructions from your health care provider about eating or drinking restrictions.  Drink enough fluid to keep your urine pale yellow.  Stay active. Weight-bearing exercise helps to keep calcium in your bones. Follow instructions from your health care provider about what type and level of exercise is safe for you.  Keep all follow-up visits as told by your health care provider. This is important. Contact a health care provider if you have:  A fever.  A heartbeat that is irregular or very fast.  Changes in mood, memory, or personality. Get help right away if you:  Have severe abdominal pain.  Have chest pain.  Have trouble breathing.  Become very confused and sleepy.  Lose consciousness. Summary  Hypercalcemia is when the level of calcium in a person's blood is above normal. The body needs calcium to make bones and keep them strong. Calcium also helps the muscles, nerves, brain, and heart work the way they should.  There are many possible causes of hypercalcemia, and treatment depends on the cause.  Take over-the-counter and prescription medicines only as told by your health care provider.  Follow instructions from your health care provider about eating or drinking restrictions. This information is not intended to replace advice given to you by your health care provider. Make sure you discuss any questions you have with your health care provider. Document Revised: 06/01/2018 Document Reviewed: 02/08/2018 Elsevier  Patient Education  2020 [employer].

## 2020-03-03 LAB — VITAMIN D 25 HYDROXY (VIT D DEFICIENCY, FRACTURES): Vit D, 25-Hydroxy: 22.1 ng/mL — ABNORMAL LOW (ref 30.0–100.0)

## 2020-03-06 NOTE — Progress Notes (Signed)
Cardiology Office Note  Date: 03/07/2020   ID: Julia Martinez, DOB 21-Dec-1950, MRN [id number]  PCP:  Celene Squibb, MD  Cardiologist:  Rozann Lesches, MD Electrophysiologist:  None   Chief Complaint  Patient presents with  . Cardiac follow-up    History of Present Illness: Julia Martinez is a 69 y.o. female last seen in April by Ms. Strader PA-C.  She presents for a routine visit.  She reports only occasional episodes of palpitations since last encounter, no syncope or prolonged events.  I reviewed her medications which are outlined below.  She continues on combination of Cardizem CD and Toprol-XL.  She has had other changes in health, diagnosed with Crohn's disease, also being followed with hyperparathyroidism.  Past Medical History:  Diagnosis Date  . Anemia   . Depression   . Essential hypertension   . OSA (obstructive sleep apnea)    Mild by sleep study 2019  . PSVT (paroxysmal supraventricular tachycardia) ([place])   . Ulcer of small intestine     Past Surgical History:  Procedure Laterality Date  . BIOPSY  10/21/2019   Procedure: BIOPSY;  Surgeon: Daneil Dolin, MD;  [facility];  Service: Endoscopy;;  small bowel  . CESAREAN SECTION    . COLONOSCOPY  2009   single external hemorrhoid tag, otherwise normal rectum. Numerous left-sided diverticula, abnormal IC valve and TI suggestive of Crohn's disease s/p biopsy. Biopsies with non-specific ulcerations. CTE then revealing chronic inflammatory change of distal TI, concerning for SB Crohn's. Did not start therapy due to absence of clinical symptoms and recommended [facility] evaluation.  . COLONOSCOPY N/A 07/21/2017   Fields: Multiple ileal diverticulum, moderate diverticulosis in the entire examined colon, small external hemorrhoids.  . COLONOSCOPY N/A 10/21/2019   Procedure: COLONOSCOPY;  Surgeon: Daneil Dolin, MD;  [facility];  Service: Endoscopy;  Laterality: N/A;  2:15pm  .  ESOPHAGOGASTRODUODENOSCOPY N/A 07/20/2017   Fields: Normal esophagus, small hiatal hernia, normal examined duodenum.  No blood in the upper GI tract.  Marland Kitchen GIVENS CAPSULE STUDY N/A 08/30/2019   Fields: Small bowel AVMs, erosions and ulcerated distal small bowel, likely in the distal jejunum/proximal ileum, study not complete the cecum.  . WISDOM TOOTH EXTRACTION      Current Outpatient Medications  Medication Sig Dispense Refill  . brimonidine (ALPHAGAN) 0.2 % ophthalmic solution 1 drop 2 (two) times daily.    . budesonide (ENTOCORT EC) 3 MG 24 hr capsule Take 1 capsule (3 mg total) by mouth daily. 30 capsule 1  . cyanocobalamin (,VITAMIN B-12,) 1000 MCG/ML injection Inject into the muscle.    . diltiazem (CARDIZEM CD) 240 MG 24 hr capsule Take 240 mg by mouth daily.    Marland Kitchen HUMIRA PEN-CD/UC/HS STARTER 80 MG/0.8ML PNKT Inject into the skin.    Marland Kitchen LATANOPROST OP Apply to eye.    . metoprolol succinate (TOPROL-XL) 100 MG 24 hr tablet Take 1 tablet (100 mg total) by mouth daily. Take with or immediately following a meal. 90 tablet 3   No current facility-administered medications for this visit.   Allergies:  Penicillins   ROS:  No syncope.  Physical Exam: VS:  BP 132/78   Pulse 70   Ht 5' 1"  (1.549 m)   Wt 173 lb (78.5 kg)   SpO2 99%   BMI 32.69 kg/m , BMI Body mass index is 32.69 kg/m.  Wt Readings from Last 3 Encounters:  03/07/20 173 lb (78.5 kg)  03/02/20 175  lb 12.8 oz (79.7 kg)  01/09/20 177 lb 12.8 oz (80.6 kg)    General: Patient appears comfortable at rest. HEENT: Conjunctiva and lids normal, wearing a mask. Neck: Supple, no elevated JVP or carotid bruits, no thyromegaly. Lungs: Clear to auscultation, nonlabored breathing at rest. Cardiac: Regular rate and rhythm, no S3, 2/6 systolic murmur. Extremities: No pitting edema, distal pulses 2+.  ECG:  An ECG dated 08/30/2019 was personally reviewed today and demonstrated:  Sinus rhythm with right bundle branch block and lead motion  artifact.  Recent Labwork: 08/31/2019: Magnesium 1.9 01/02/2020: ALT 18; AST 12; BUN 21; Creatinine, Ser 0.90; Hemoglobin 15.0; Platelets 235; Potassium 4.3; Sodium 138   Other Studies Reviewed Today:  Echocardiogram 10/01/2017: - Left ventricle: The cavity size was normal. Wall thickness was  normal. Systolic function was normal. The estimated ejection  fraction was in the range of 60% to 65%. Wall motion was normal;  there were no regional wall motion abnormalities. Doppler  parameters are consistent with abnormal left ventricular  relaxation (grade 1 diastolic dysfunction). Doppler parameters  are consistent with high ventricular filling pressure.  - Aortic valve: Mildly calcified annulus. Trileaflet; mildly  thickened leaflets. Valve area (VTI): 1.81 cm^2. Valve area  (Vmax): 1.53 cm^2. Valve area (Vmean): 1.54 cm^2.  - Mitral valve: Mildly calcified annulus. Normal thickness leaflets  . There was mild regurgitation.  - Left atrium: The atrium was mildly dilated.  - Right ventricle: The cavity size was mildly to moderately  dilated.  - Right atrium: The atrium was mildly dilated.  - Atrial septum: No defect or patent foramen ovale was identified.  - Pulmonary arteries: Systolic pressure was moderately increased.  PA peak pressure: 50 mm Hg (S).  - Technically adequate study.   Assessment and Plan:  1.  PSVT, only a few episodes of palpitations since last assessment.  Plan to continue observation on Cardizem CD and Toprol-XL at current doses.  Could consider as needed use of short acting Cardizem 30 mg tablets if breakthrough episodes increase in frequency.  2.  Essential hypertension, systolic 827 today.  No change in current regimen.  Medication Adjustments/Labs and Tests Ordered: Current medicines are reviewed at length with the patient today.  Concerns regarding medicines are outlined above.   Tests Ordered: No orders of the defined types were placed  in this encounter.   Medication Changes: No orders of the defined types were placed in this encounter.   Disposition:  Follow up 6 months in the [place] office.  Signed, Satira Sark, MD, [facility] 03/07/2020 1:15 PM    [facility] at [facility] [address], [place], [place] [address] Phone: [phone]; Fax: [phone]

## 2020-03-07 ENCOUNTER — Encounter: Payer: Self-pay | Admitting: Cardiology

## 2020-03-07 ENCOUNTER — Ambulatory Visit (INDEPENDENT_AMBULATORY_CARE_PROVIDER_SITE_OTHER): Payer: Medicare Other | Admitting: Cardiology

## 2020-03-07 ENCOUNTER — Other Ambulatory Visit: Payer: Self-pay

## 2020-03-07 VITALS — BP 132/78 | HR 70 | Ht 61.0 in | Wt 173.0 lb

## 2020-03-07 DIAGNOSIS — I471 Supraventricular tachycardia: Secondary | ICD-10-CM | POA: Diagnosis not present

## 2020-03-07 DIAGNOSIS — I10 Essential (primary) hypertension: Secondary | ICD-10-CM | POA: Diagnosis not present

## 2020-03-07 NOTE — Patient Instructions (Signed)
Medication Instructions:  °Your physician recommends that you continue on your current medications as directed. Please refer to the Current Medication list given to you today. ° °*If you need a refill on your cardiac medications before your next appointment, please call your pharmacy* ° ° °Lab Work: °None today °If you have labs (blood work) drawn today and your tests are completely normal, you will receive your results only by: °• MyChart Message (if you have MyChart) OR °• A paper copy in the mail °If you have any lab test that is abnormal or we need to change your treatment, we will call you to review the results. ° ° °Testing/Procedures: °None today ° ° °Follow-Up: °At [facility], you and your health needs are our priority.  As part of our continuing mission to provide you with exceptional heart care, we have created designated Provider Care Teams.  These Care Teams include your primary Cardiologist (physician) and Advanced Practice Providers (APPs -  Physician Assistants and Nurse Practitioners) who all work together to provide you with the care you need, when you need it. ° °We recommend signing up for the patient portal called "MyChart".  Sign up information is provided on this After Visit Summary.  MyChart is used to connect with patients for Virtual Visits (Telemedicine).  Patients are able to view lab/test results, encounter notes, upcoming appointments, etc.  Non-urgent messages can be sent to your provider as well.   °To learn more about what you can do with MyChart, go to [email or web address].   ° °Your next appointment:   °6 month(s) ° °The format for your next appointment:   °In Person ° °Provider:   °Samuel McDowell, MD ° ° °Other Instructions °None ° ° ° ° °Thank you for choosing [facility] ! ° ° ° ° ° ° ° ° °

## 2020-03-13 ENCOUNTER — Telehealth: Payer: Self-pay

## 2020-03-13 ENCOUNTER — Other Ambulatory Visit: Payer: Self-pay

## 2020-03-13 MED ORDER — BUDESONIDE 3 MG PO CPEP: 3.0000 mg | ORAL_CAPSULE | Freq: Every day | ORAL | 0 refills | Status: DC

## 2020-03-13 NOTE — Telephone Encounter (Signed)
90 okay.  But this medication will be tapering off.

## 2020-03-13 NOTE — Telephone Encounter (Signed)
Received a call from [facility]. It's less expensive for pt to have a 90 day supply of Budesonide. Please advise if it's ok for pt to have a 90 day supply of medication.

## 2020-03-13 NOTE — Telephone Encounter (Signed)
Noted. 90 day supply sent for Entocort 3 mg daily.

## 2020-03-28 ENCOUNTER — Other Ambulatory Visit: Payer: Self-pay

## 2020-03-28 ENCOUNTER — Encounter (INDEPENDENT_AMBULATORY_CARE_PROVIDER_SITE_OTHER): Payer: Medicare Other | Admitting: Ophthalmology

## 2020-03-28 DIAGNOSIS — H35033 Hypertensive retinopathy, bilateral: Secondary | ICD-10-CM

## 2020-03-28 DIAGNOSIS — I10 Essential (primary) hypertension: Secondary | ICD-10-CM | POA: Diagnosis not present

## 2020-03-28 DIAGNOSIS — H4423 Degenerative myopia, bilateral: Secondary | ICD-10-CM | POA: Diagnosis not present

## 2020-03-28 DIAGNOSIS — H43813 Vitreous degeneration, bilateral: Secondary | ICD-10-CM

## 2020-04-09 DIAGNOSIS — R300 Dysuria: Secondary | ICD-10-CM | POA: Diagnosis not present

## 2020-04-09 DIAGNOSIS — R10814 Left lower quadrant abdominal tenderness: Secondary | ICD-10-CM | POA: Diagnosis not present

## 2020-04-16 DIAGNOSIS — Z23 Encounter for immunization: Secondary | ICD-10-CM | POA: Diagnosis not present

## 2020-05-21 ENCOUNTER — Inpatient Hospital Stay (HOSPITAL_COMMUNITY): Payer: Medicare Other

## 2020-05-28 ENCOUNTER — Ambulatory Visit (HOSPITAL_COMMUNITY): Payer: Medicare Other | Admitting: Hematology

## 2020-06-06 ENCOUNTER — Ambulatory Visit: Payer: Medicare Other | Admitting: Internal Medicine

## 2020-06-18 ENCOUNTER — Other Ambulatory Visit: Payer: Self-pay | Admitting: Cardiology

## 2020-06-18 ENCOUNTER — Other Ambulatory Visit: Payer: Self-pay | Admitting: Internal Medicine

## 2020-06-20 ENCOUNTER — Telehealth: Payer: Self-pay

## 2020-06-20 NOTE — Telephone Encounter (Signed)
Received a call from [facility]. A refill request was sent Monday for Budesonide 3 mg. Please send in refill.

## 2020-06-20 NOTE — Telephone Encounter (Signed)
Spoke with pt. Pt was notified that RX was sent to the pharmacy.

## 2020-06-20 NOTE — Telephone Encounter (Signed)
Rx sent 

## 2020-07-17 ENCOUNTER — Telehealth: Payer: Self-pay | Admitting: Internal Medicine

## 2020-07-17 NOTE — Telephone Encounter (Signed)
Spoke with pt. She wanted to know if she would be on Budesonide for life. Pt is aware that this is a medication that must be tapered and not d/c at once. When pt follows up with the doctor, they will discuss how pt is doing before considering if pt needs to d/c or increase dose.

## 2020-07-17 NOTE — Telephone Encounter (Signed)
Pt has questions about a medication she is taking. Please call [phone]

## 2020-08-09 ENCOUNTER — Telehealth: Payer: Self-pay | Admitting: Student

## 2020-08-09 NOTE — Telephone Encounter (Signed)
Patient wants to know if she can be squeezed in sooner she is having some palpitations, and a burning in the pit of her stomach , I scheduled her with Domenic Polite for tomorrow , would like a call back

## 2020-08-09 NOTE — Telephone Encounter (Signed)
LMTCB

## 2020-08-10 ENCOUNTER — Encounter: Payer: Self-pay | Admitting: Cardiology

## 2020-08-10 ENCOUNTER — Other Ambulatory Visit: Payer: Self-pay

## 2020-08-10 ENCOUNTER — Ambulatory Visit (INDEPENDENT_AMBULATORY_CARE_PROVIDER_SITE_OTHER): Payer: Medicare Other | Admitting: Cardiology

## 2020-08-10 VITALS — BP 156/92 | HR 73 | Ht 63.0 in | Wt 178.0 lb

## 2020-08-10 DIAGNOSIS — I10 Essential (primary) hypertension: Secondary | ICD-10-CM

## 2020-08-10 DIAGNOSIS — I471 Supraventricular tachycardia: Secondary | ICD-10-CM

## 2020-08-10 DIAGNOSIS — R002 Palpitations: Secondary | ICD-10-CM | POA: Diagnosis not present

## 2020-08-10 MED ORDER — DILTIAZEM HCL ER COATED BEADS 300 MG PO CP24: 300.0000 mg | ORAL_CAPSULE | Freq: Every day | ORAL | 3 refills | Status: DC

## 2020-08-10 NOTE — Progress Notes (Signed)
Cardiology Office Note  Date: 08/10/2020   ID: PAHOLA Martinez, DOB 02-Jan-1951, MRN [id number]  PCP:  Celene Squibb, MD  Cardiologist:  Rozann Lesches, MD Electrophysiologist:  None   Chief Complaint  Patient presents with  . Palpitations    History of Present Illness: Julia Martinez is a 70 y.o. female last seen in October 2021.  She presents for a routine visit.  States that she has been under a lot of stress recently.  Her mother passed away, and her son has also been in poor health.  She has felt very anxious, intermittent palpitations, also increasing reflux symptoms.  No dizziness or syncope.  I personally reviewed her ECG today which shows sinus rhythm with right bundle branch block and ST-T wave abnormalities that are old.  We went over her medications today, discussed dividing Cardizem CD and Toprol-XL to separate dosing instead of at the same time, also increasing Cardizem CD to 300 mg daily.  Past Medical History:  Diagnosis Date  . Anemia   . Depression   . Essential hypertension   . OSA (obstructive sleep apnea)    Mild by sleep study 2019  . PSVT (paroxysmal supraventricular tachycardia) ([place])   . Ulcer of small intestine     Past Surgical History:  Procedure Laterality Date  . BIOPSY  10/21/2019   Procedure: BIOPSY;  Surgeon: Daneil Dolin, MD;  [facility];  Service: Endoscopy;;  small bowel  . CESAREAN SECTION    . COLONOSCOPY  2009   single external hemorrhoid tag, otherwise normal rectum. Numerous left-sided diverticula, abnormal IC valve and TI suggestive of Crohn's disease s/p biopsy. Biopsies with non-specific ulcerations. CTE then revealing chronic inflammatory change of distal TI, concerning for SB Crohn's. Did not start therapy due to absence of clinical symptoms and recommended [facility] evaluation.  . COLONOSCOPY N/A 07/21/2017   Fields: Multiple ileal diverticulum, moderate diverticulosis in the entire examined colon, small external  hemorrhoids.  . COLONOSCOPY N/A 10/21/2019   Procedure: COLONOSCOPY;  Surgeon: Daneil Dolin, MD;  [facility];  Service: Endoscopy;  Laterality: N/A;  2:15pm  . ESOPHAGOGASTRODUODENOSCOPY N/A 07/20/2017   Fields: Normal esophagus, small hiatal hernia, normal examined duodenum.  No blood in the upper GI tract.  Marland Kitchen GIVENS CAPSULE STUDY N/A 08/30/2019   Fields: Small bowel AVMs, erosions and ulcerated distal small bowel, likely in the distal jejunum/proximal ileum, study not complete the cecum.  . WISDOM TOOTH EXTRACTION      Current Outpatient Medications  Medication Sig Dispense Refill  . brimonidine (ALPHAGAN) 0.2 % ophthalmic solution 1 drop 2 (two) times daily.    . budesonide (ENTOCORT EC) 3 MG 24 hr capsule TAKE ONE CAPSULE (3 MG TOTAL) BY MOUTH DAILY. 90 capsule 0  . cyanocobalamin (,VITAMIN B-12,) 1000 MCG/ML injection Inject into the muscle.    . diltiazem (CARDIZEM CD) 300 MG 24 hr capsule Take 1 capsule (300 mg total) by mouth daily. 90 capsule 3  . HUMIRA PEN-CD/UC/HS STARTER 80 MG/0.8ML PNKT Inject into the skin.    Marland Kitchen LATANOPROST OP Apply to eye.    . metoprolol succinate (TOPROL-XL) 100 MG 24 hr tablet Take 1 tablet (100 mg total) by mouth daily. Take with or immediately following a meal. 90 tablet 3   No current facility-administered medications for this visit.   Allergies:  Penicillins   ROS: No syncope.  Physical Exam: VS:  BP (!) 156/92   Pulse 73   Ht  5' 3"  (1.6 m)   Wt 178 lb (80.7 kg)   SpO2 97%   BMI 31.53 kg/m , BMI Body mass index is 31.53 kg/m.  Wt Readings from Last 3 Encounters:  08/10/20 178 lb (80.7 kg)  03/07/20 173 lb (78.5 kg)  03/02/20 175 lb 12.8 oz (79.7 kg)    General: Patient appears comfortable at rest. HEENT: Conjunctiva and lids normal, wearing a mask. Neck: Supple, no elevated JVP or carotid bruits, no thyromegaly. Lungs: Clear to auscultation, nonlabored breathing at rest. Cardiac: Regular rate and rhythm, no S3, 2/6  systolic murmur, no pericardial rub. Extremities: No pitting edema.  ECG:  An ECG dated 08/30/2019 was personally reviewed today and demonstrated:  Normal sinus rhythm with right bundle branch block, lead motion artifact.  Recent Labwork: 08/31/2019: Magnesium 1.9 01/02/2020: ALT 18; AST 12; BUN 21; Creatinine, Ser 0.90; Hemoglobin 15.0; Platelets 235; Potassium 4.3; Sodium 138   Other Studies Reviewed Today:  Echocardiogram 10/01/2017: - Left ventricle: The cavity size was normal. Wall thickness was  normal. Systolic function was normal. The estimated ejection  fraction was in the range of 60% to 65%. Wall motion was normal;  there were no regional wall motion abnormalities. Doppler  parameters are consistent with abnormal left ventricular  relaxation (grade 1 diastolic dysfunction). Doppler parameters  are consistent with high ventricular filling pressure.  - Aortic valve: Mildly calcified annulus. Trileaflet; mildly  thickened leaflets. Valve area (VTI): 1.81 cm^2. Valve area  (Vmax): 1.53 cm^2. Valve area (Vmean): 1.54 cm^2.  - Mitral valve: Mildly calcified annulus. Normal thickness leaflets  . There was mild regurgitation.  - Left atrium: The atrium was mildly dilated.  - Right ventricle: The cavity size was mildly to moderately  dilated.  - Right atrium: The atrium was mildly dilated.  - Atrial septum: No defect or patent foramen ovale was identified.  - Pulmonary arteries: Systolic pressure was moderately increased.  PA peak pressure: 50 mm Hg (S).  - Technically adequate study.   Assessment and Plan:  1.  Recurrent palpitations in the setting of situational stress.  She has a history of PSVT on dual AV nodal blockers.  Plan to increase Cardizem CD to 300 mg daily, continue current dose of Toprol-XL.  Office follow-up arranged.  2.  Essential hypertension, blood pressure is up today.  Medication adjustments made as noted above.  Continue observation with  Dr. Nevada Crane.  Medication Adjustments/Labs and Tests Ordered: Current medicines are reviewed at length with the patient today.  Concerns regarding medicines are outlined above.   Tests Ordered: Orders Placed This Encounter  Procedures  . EKG 12-Lead    Medication Changes: Meds ordered this encounter  Medications  . diltiazem (CARDIZEM CD) 300 MG 24 hr capsule    Sig: Take 1 capsule (300 mg total) by mouth daily.    Dispense:  90 capsule    Refill:  3    08/10/20 dose increased to 300 mg qd    Disposition:  Follow up 6 weeks in the [place] office.  Signed, Satira Sark, MD, [facility] 08/10/2020 11:14 AM    [place] [facility] [address], [place], [place] [address] Phone: [phone]; Fax: [phone]

## 2020-08-10 NOTE — Patient Instructions (Signed)
Medication Instructions:  INCREASE Diltiazem to 300 mg daily   Take your Diltiazem in the morning and take your metoprolol at night    *If you need a refill on your cardiac medications before your next appointment, please call your pharmacy*   Lab Work: None today If you have labs (blood work) drawn today and your tests are completely normal, you will receive your results only by: Marland Kitchen MyChart Message (if you have MyChart) OR . A paper copy in the mail If you have any lab test that is abnormal or we need to change your treatment, we will call you to review the results.   Testing/Procedures: None today   Follow-Up: At [facility], you and your health needs are our priority.  As part of our continuing mission to provide you with exceptional heart care, we have created designated Provider Care Teams.  These Care Teams include your primary Cardiologist (physician) and Advanced Practice Providers (APPs -  Physician Assistants and Nurse Practitioners) who all work together to provide you with the care you need, when you need it.  We recommend signing up for the patient portal called "MyChart".  Sign up information is provided on this After Visit Summary.  MyChart is used to connect with patients for Virtual Visits (Telemedicine).  Patients are able to view lab/test results, encounter notes, upcoming appointments, etc.  Non-urgent messages can be sent to your provider as well.   To learn more about what you can do with MyChart, go to [email or web address].    Your next appointment:   6 week(s)  The format for your next appointment:   In Person  Provider:   Rozann Lesches, MD   Other Instructions None       Thank you for choosing [facility] !

## 2020-08-10 NOTE — Telephone Encounter (Signed)
Patient did not return call from yesterday.She has apt this morning with Dr.McDowell.

## 2020-08-14 ENCOUNTER — Other Ambulatory Visit: Payer: Self-pay | Admitting: Cardiology

## 2020-08-22 ENCOUNTER — Ambulatory Visit: Payer: Medicare Other | Admitting: Student

## 2020-08-22 DIAGNOSIS — Z13820 Encounter for screening for osteoporosis: Secondary | ICD-10-CM | POA: Diagnosis not present

## 2020-08-22 DIAGNOSIS — Z1231 Encounter for screening mammogram for malignant neoplasm of breast: Secondary | ICD-10-CM | POA: Diagnosis not present

## 2020-08-22 DIAGNOSIS — E538 Deficiency of other specified B group vitamins: Secondary | ICD-10-CM | POA: Diagnosis not present

## 2020-08-22 DIAGNOSIS — M25551 Pain in right hip: Secondary | ICD-10-CM | POA: Diagnosis not present

## 2020-08-24 ENCOUNTER — Other Ambulatory Visit: Payer: Self-pay

## 2020-08-24 ENCOUNTER — Ambulatory Visit (INDEPENDENT_AMBULATORY_CARE_PROVIDER_SITE_OTHER): Payer: Medicare Other | Admitting: Internal Medicine

## 2020-08-24 ENCOUNTER — Encounter: Payer: Self-pay | Admitting: Internal Medicine

## 2020-08-24 VITALS — BP 136/78 | HR 72 | Temp 97.1°F | Ht 63.0 in | Wt 185.4 lb

## 2020-08-24 DIAGNOSIS — D509 Iron deficiency anemia, unspecified: Secondary | ICD-10-CM

## 2020-08-24 DIAGNOSIS — T189XXD Foreign body of alimentary tract, part unspecified, subsequent encounter: Secondary | ICD-10-CM

## 2020-08-24 DIAGNOSIS — K633 Ulcer of intestine: Secondary | ICD-10-CM | POA: Diagnosis not present

## 2020-08-24 DIAGNOSIS — D500 Iron deficiency anemia secondary to blood loss (chronic): Secondary | ICD-10-CM

## 2020-08-24 NOTE — Patient Instructions (Signed)
Stop Entocort entirely as of today  Continue Humira  Serum ferritin and CBC soon as feasible  Flat and upright plain abdominal films to assess for retained capsule  If right groin pain does not subside, see Dr. Aline Brochure as discussed  Avoid ibuprofen and all NSAIDs; use acetaminophen or nonaspirin products for aches and pains  Office visit here in 3 months

## 2020-08-24 NOTE — Progress Notes (Signed)
Primary Care Physician:  Pablo Lawrence, NP Primary Gastroenterologist:  Dr. Gala Romney  Pre-Procedure History & Physical: HPI:  Julia Martinez is a 70 y.o. female here for follow-up of small bowel Crohn's disease with secondary iron deficiency anemia.  She is now been tolerating Humira for the better part of the year.  Had been overlap with budesonide 9 mg daily.  All of her bowel symptoms have subsided.  She has no abdominal discomfort no nausea no rectal bleeding or melena.  Denies diarrhea has 1 formed bowel movement daily.  She feels well far as GI symptoms goes. Of note, on her own, she cut back on budesonide to (2) 3 mg tablets about 6 months ago and then 3 months ago dropped back to (1) 3 mg tablet daily.  Over the past 1 month, she has cut back to (1) 3 mg tablet every other day. Again, tolerating Humira very well.  She did have iron studies which were within normal limits back in August of last year none since that time.  No iron supplement now.  Gets monthly B12 injections.  CTA abdomen last year demonstrated no evidence of critical mesenteric ischemia.   Patient has a history of history of retained Given capsule.  I performed ileoscopy last year and ran into the ulcerated ileum and a high-grade stricture.  I made it to 30 cm; unable to see/remove capsule.  She is followed by oncology for monoclonal gammopathy.  Recently did a lot of moving in her house up and down the stairs and developed right groin pain for which she was given a steroid injection and started on muscle relaxers per PCP -   That was a few weeks ago -  symptoms have not improved.  No direct trauma.  Past Medical History:  Diagnosis Date  . Anemia   . Depression   . Essential hypertension   . OSA (obstructive sleep apnea)    Mild by sleep study 2019  . PSVT (paroxysmal supraventricular tachycardia) ([place])   . Ulcer of small intestine     Past Surgical History:  Procedure Laterality Date  . BIOPSY  10/21/2019    Procedure: BIOPSY;  Surgeon: Daneil Dolin, MD;  [facility];  Service: Endoscopy;;  small bowel  . CESAREAN SECTION    . COLONOSCOPY  2009   single external hemorrhoid tag, otherwise normal rectum. Numerous left-sided diverticula, abnormal IC valve and TI suggestive of Crohn's disease s/p biopsy. Biopsies with non-specific ulcerations. CTE then revealing chronic inflammatory change of distal TI, concerning for SB Crohn's. Did not start therapy due to absence of clinical symptoms and recommended [facility] evaluation.  . COLONOSCOPY N/A 07/21/2017   Fields: Multiple ileal diverticulum, moderate diverticulosis in the entire examined colon, small external hemorrhoids.  . COLONOSCOPY N/A 10/21/2019   Procedure: COLONOSCOPY;  Surgeon: Daneil Dolin, MD;  [facility];  Service: Endoscopy;  Laterality: N/A;  2:15pm  . ESOPHAGOGASTRODUODENOSCOPY N/A 07/20/2017   Fields: Normal esophagus, small hiatal hernia, normal examined duodenum.  No blood in the upper GI tract.  Marland Kitchen GIVENS CAPSULE STUDY N/A 08/30/2019   Fields: Small bowel AVMs, erosions and ulcerated distal small bowel, likely in the distal jejunum/proximal ileum, study not complete the cecum.  . WISDOM TOOTH EXTRACTION      Prior to Admission medications   Medication Sig Start Date End Date Taking? Authorizing Provider  brimonidine (ALPHAGAN) 0.2 % ophthalmic solution 1 drop 2 (two) times daily. 01/02/20  Yes [provider]  budesonide (ENTOCORT EC) 3 MG 24 hr capsule TAKE ONE CAPSULE (3 MG TOTAL) BY MOUTH DAILY. 06/20/20  Yes Jodi Mourning, Kristen S, PA-C  cyanocobalamin (,VITAMIN B-12,) 1000 MCG/ML injection Inject into the muscle every 30 (thirty) days. 01/04/20  Yes [provider]  diltiazem (CARDIZEM CD) 300 MG 24 hr capsule Take 1 capsule (300 mg total) by mouth daily. 08/10/20  Yes Satira Sark, MD  HUMIRA PEN-CD/UC/HS STARTER 80 MG/0.8ML PNKT Inject into the skin every 14 (fourteen) days. 12/02/19  Yes  [provider]  LATANOPROST OP Apply to eye.   Yes [provider]  metoprolol succinate (TOPROL-XL) 100 MG 24 hr tablet TAKE ONE TABLET (100MG TOTAL) BY MOUTH DAILY. TAKE WITH OR IMMEDIATELY FOLLOWING A MEAL. TAKING A TOTAL OF 150MG DAILY 08/14/20  Yes Satira Sark, MD    Allergies as of 08/24/2020 - Review Complete 08/24/2020  Allergen Reaction Noted  . Penicillins Nausea Only 10/06/2013    Family History  Problem Relation Age of Onset  . Depression Mother   . Ulcers Mother   . Emphysema Father   . Prader-Willi syndrome Son   . Colon cancer Neg Hx   . Colon polyps Neg Hx     Social History   Socioeconomic History  . Marital status: Married    Spouse name: Not on file  . Number of children: 3  . Years of education: Not on file  . Highest education level: Not on file  Occupational History  . Occupation: paraprofessional  Tobacco Use  . Smoking status: Never Smoker  . Smokeless tobacco: Never Used  Vaping Use  . Vaping Use: Never used  Substance and Sexual Activity  . Alcohol use: No  . Drug use: No  . Sexual activity: Not Currently  Other Topics Concern  . Not on file  Social History Narrative  . Not on file   Social Determinants of Health   Financial Resource Strain: Not on file  Food Insecurity: Not on file  Transportation Needs: Not on file  Physical Activity: Not on file  Stress: Not on file  Social Connections: Not on file  Intimate Partner Violence: Not on file    Review of Systems: See HPI, otherwise negative ROS  Physical Exam: BP 136/78   Pulse 72   Temp (!) 97.1 F (36.2 C) (Temporal)   Ht 5' 3"  (1.6 m)   Wt 185 lb 6.4 oz (84.1 kg)   BMI 32.84 kg/m  General:   Alert,   pleasant and cooperative in NAD Neck:  Supple; no masses or thyromegaly. No significant cervical adenopathy. Lungs:  Clear throughout to auscultation.   No wheezes, crackles, or rhonchi. No acute distress. Heart:  Regular rate and rhythm; 3/6  systolic ejection murmur, clicks, rubs,  or gallops. Abdomen: Obese.  Positive bowel sounds soft and nontender  Pulses:  Normal pulses noted. Extremities:  Without clubbing or edema.  Impression/Plan: 70 year old lady with ileal Crohn's disease with secondary ulceration, stricture and iron deficiency anemia.  Tolerating Humira well.  She is now devoid of any GI symptoms.  On her own, she has tapered off her budesonide over the past 6 months and is basically taking homeopathic doses at this time.  Clinically, she is in remission.  History of small bowel capsule retention.  Follow-up imaging needed  New onset right groin pain.  Hopefully, it is a muscle strain and not a hip issue.   Recommendations:  Stop Entocort entirely as of today  Continue Humira  Continue monthly B12 injections.  Serum ferritin and CBC soon as feasible  Flat and upright plain abdominal films to assess for retained capsule  If right groin pain does not subside, see Dr. Aline Brochure as discussed  Avoid ibuprofen and all NSAIDs; use acetaminophen or nonaspirin products for aches and pains  Office visit here in 3 months      Notice: This dictation was prepared with Dragon dictation along with smaller phrase technology. Any transcriptional errors that result from this process are unintentional and may not be corrected upon review.

## 2020-08-27 ENCOUNTER — Other Ambulatory Visit: Payer: Self-pay

## 2020-08-27 ENCOUNTER — Ambulatory Visit (HOSPITAL_COMMUNITY)
Admission: RE | Admit: 2020-08-27 | Discharge: 2020-08-27 | Disposition: A | Discharge status: Home / Self Care | Payer: Medicare Other | Source: Ambulatory Visit | Attending: Internal Medicine | Admitting: Internal Medicine

## 2020-08-27 ENCOUNTER — Other Ambulatory Visit (HOSPITAL_COMMUNITY)
Admission: RE | Admit: 2020-08-27 | Discharge: 2020-08-27 | Disposition: A | Discharge status: Home / Self Care | Payer: Medicare Other | Source: Ambulatory Visit | Attending: Internal Medicine | Admitting: Internal Medicine

## 2020-08-27 ENCOUNTER — Telehealth: Payer: Self-pay | Admitting: Orthopedic Surgery

## 2020-08-27 DIAGNOSIS — T189XXD Foreign body of alimentary tract, part unspecified, subsequent encounter: Secondary | ICD-10-CM | POA: Diagnosis not present

## 2020-08-27 DIAGNOSIS — D509 Iron deficiency anemia, unspecified: Secondary | ICD-10-CM | POA: Insufficient documentation

## 2020-08-27 DIAGNOSIS — Z0389 Encounter for observation for other suspected diseases and conditions ruled out: Secondary | ICD-10-CM | POA: Diagnosis not present

## 2020-08-27 LAB — CBC WITH DIFFERENTIAL/PLATELET
Abs Immature Granulocytes: 0.08 10*3/uL — ABNORMAL HIGH (ref 0.00–0.07)
Basophils Absolute: 0.1 10*3/uL (ref 0.0–0.1)
Basophils Relative: 1 %
Eosinophils Absolute: 0.1 10*3/uL (ref 0.0–0.5)
Eosinophils Relative: 1 %
HCT: 45.3 % (ref 36.0–46.0)
Hemoglobin: 14.5 g/dL (ref 12.0–15.0)
Immature Granulocytes: 1 %
Lymphocytes Relative: 16 %
Lymphs Abs: 1.7 10*3/uL (ref 0.7–4.0)
MCH: 29.6 pg (ref 26.0–34.0)
MCHC: 32 g/dL (ref 30.0–36.0)
MCV: 92.4 fL (ref 80.0–100.0)
Monocytes Absolute: 0.8 10*3/uL (ref 0.1–1.0)
Monocytes Relative: 8 %
Neutro Abs: 8.3 10*3/uL — ABNORMAL HIGH (ref 1.7–7.7)
Neutrophils Relative %: 73 %
Platelets: 271 10*3/uL (ref 150–400)
RBC: 4.9 MIL/uL (ref 3.87–5.11)
RDW: 15 % (ref 11.5–15.5)
WBC: 11.2 10*3/uL — ABNORMAL HIGH (ref 4.0–10.5)
nRBC: 0 % (ref 0.0–0.2)

## 2020-08-27 LAB — FERRITIN: Ferritin: 50 ng/mL (ref 11–307)

## 2020-08-27 NOTE — Telephone Encounter (Signed)
Please see below from Dr Amedeo Kinsman, thanks.

## 2020-08-27 NOTE — Telephone Encounter (Signed)
Please advise on this one? Thanks.

## 2020-08-27 NOTE — Telephone Encounter (Signed)
Called patient per voice message left over weekend. Patient states to have appointment with Dr Amedeo Kinsman, per her daughter speaking with Dr Amedeo Kinsman at [facility] this morning. Please advise.

## 2020-08-27 NOTE — Telephone Encounter (Signed)
Reached patient; scheduled appointment accordingly.

## 2020-08-27 NOTE — Telephone Encounter (Signed)
Thanks Abigail Butts  I think this is a family member of someone who works at [employer].  Caren Griffins asked me about an appointment, but I did not get a name.  I think there is some availability of Wednesday, if she can wait until then I think it would be better than tomorrow.  Either way, ok to schedule.   Thanks [employer]

## 2020-08-28 ENCOUNTER — Encounter: Payer: Self-pay | Admitting: Orthopedic Surgery

## 2020-08-28 ENCOUNTER — Ambulatory Visit: Payer: Medicare Other

## 2020-08-28 ENCOUNTER — Ambulatory Visit (INDEPENDENT_AMBULATORY_CARE_PROVIDER_SITE_OTHER): Payer: Medicare Other | Admitting: Orthopedic Surgery

## 2020-08-28 VITALS — BP 174/90 | HR 61 | Ht 63.0 in | Wt 185.0 lb

## 2020-08-28 DIAGNOSIS — R1031 Right lower quadrant pain: Secondary | ICD-10-CM

## 2020-08-28 DIAGNOSIS — M1611 Unilateral primary osteoarthritis, right hip: Secondary | ICD-10-CM

## 2020-08-28 MED ORDER — PREDNISONE 10 MG (21) PO TBPK: ORAL_TABLET | ORAL | 0 refills | Status: DC

## 2020-08-28 NOTE — Progress Notes (Signed)
New Patient Visit  Assessment: Julia Martinez is a 70 y.o. female with the following: Right groin pain; likely muscular with low suspicion for right hip intra-articular pathology  Plan: Patient's right hip pain is most likely musculoskeletal at this point.  Radiographs were reviewed which demonstrates some mild to moderate degenerative changes within the right hip.  She tolerates right hip range of motion with minimal discomfort.  However, she does have worsening pain with resisted hip abduction, as well as some pain with external rotation and internal rotation of her right hip.  She has a history of Crohn's, and can therefore not take NSAIDs.  As result, I recommended a short course of prednisone to try and calm the irritation in her right groin area.  I have also recommended a cane, provided her with a prescription to obtain a cane.  Activities as tolerated.  If she continues to have discomfort in her right hip and groin area, we can consider an intra-articular injection versus MRI for further evaluation.  All questions were answered and she is amenable to this plan.   Follow-up: Return if symptoms worsen or fail to improve.  Subjective:  Chief Complaint  Patient presents with  . Groin Pain    Rt side inner thigh/groin  pain for 2 wks.     History of Present Illness: Julia Martinez is a 70 y.o. female who presents for evaluation of right hip pain.  She states that she has had pain in her right groin and inner thigh area for the last 2-3 weeks.  No specific injury.  Prior to the onset of her pain, she does report day of increased activity around her house.  She states that she was cleaning out her vehicle, and going up and down stairs several times that day.  Since then, her pain is primarily in the inner thigh area.  It does radiate distally.  Is difficult for her to walk.  She has been taking some Tylenol for pain.  She is unable to take NSAIDs due to history of Crohn's.  Additionally, she  recently stopped taking budesonide which she had been taking for the last year.   Review of Systems: No fevers or chills No numbness or tingling No chest pain No shortness of breath No bowel or bladder dysfunction No GI distress No headaches   Medical History:  Past Medical History:  Diagnosis Date  . Anemia   . Depression   . Essential hypertension   . OSA (obstructive sleep apnea)    Mild by sleep study 2019  . PSVT (paroxysmal supraventricular tachycardia) ([place])   . Ulcer of small intestine     Past Surgical History:  Procedure Laterality Date  . BIOPSY  10/21/2019   Procedure: BIOPSY;  Surgeon: Daneil Dolin, MD;  [facility];  Service: Endoscopy;;  small bowel  . CESAREAN SECTION    . COLONOSCOPY  2009   single external hemorrhoid tag, otherwise normal rectum. Numerous left-sided diverticula, abnormal IC valve and TI suggestive of Crohn's disease s/p biopsy. Biopsies with non-specific ulcerations. CTE then revealing chronic inflammatory change of distal TI, concerning for SB Crohn's. Did not start therapy due to absence of clinical symptoms and recommended [facility] evaluation.  . COLONOSCOPY N/A 07/21/2017   Fields: Multiple ileal diverticulum, moderate diverticulosis in the entire examined colon, small external hemorrhoids.  . COLONOSCOPY N/A 10/21/2019   Procedure: COLONOSCOPY;  Surgeon: Daneil Dolin, MD;  [facility];  Service: Endoscopy;  Laterality: N/A;  2:15pm  . ESOPHAGOGASTRODUODENOSCOPY N/A 07/20/2017   Fields: Normal esophagus, small hiatal hernia, normal examined duodenum.  No blood in the upper GI tract.  Marland Kitchen GIVENS CAPSULE STUDY N/A 08/30/2019   Fields: Small bowel AVMs, erosions and ulcerated distal small bowel, likely in the distal jejunum/proximal ileum, study not complete the cecum.  . WISDOM TOOTH EXTRACTION      Family History  Problem Relation Age of Onset  . Depression Mother   . Ulcers Mother   . Emphysema Father   .  Prader-Willi syndrome Son   . Colon cancer Neg Hx   . Colon polyps Neg Hx    Social History   Tobacco Use  . Smoking status: Never Smoker  . Smokeless tobacco: Never Used  Vaping Use  . Vaping Use: Never used  Substance Use Topics  . Alcohol use: No  . Drug use: No    Allergies  Allergen Reactions  . Penicillins Nausea Only    Current Meds  Medication Sig  . brimonidine (ALPHAGAN) 0.2 % ophthalmic solution 1 drop 2 (two) times daily.  . cyanocobalamin (,VITAMIN B-12,) 1000 MCG/ML injection Inject into the muscle every 30 (thirty) days.  Marland Kitchen diltiazem (CARDIZEM CD) 300 MG 24 hr capsule Take 1 capsule (300 mg total) by mouth daily.  Marland Kitchen HUMIRA PEN-CD/UC/HS STARTER 80 MG/0.8ML PNKT Inject into the skin every 14 (fourteen) days.  Marland Kitchen LATANOPROST OP Apply to eye.  . methocarbamol (ROBAXIN) 500 MG tablet Take by mouth.  . metoprolol succinate (TOPROL-XL) 100 MG 24 hr tablet TAKE ONE TABLET (100MG TOTAL) BY MOUTH DAILY. TAKE WITH OR IMMEDIATELY FOLLOWING A MEAL. TAKING A TOTAL OF 150MG DAILY  . predniSONE (STERAPRED UNI-PAK 21 TAB) 10 MG (21) TBPK tablet 10 mg DS 12 as directed  . [DISCONTINUED] budesonide (ENTOCORT EC) 3 MG 24 hr capsule TAKE ONE CAPSULE (3 MG TOTAL) BY MOUTH DAILY.    Objective: BP (!) 174/90   Pulse 61   Ht 5' 3"  (1.6 m)   Wt 185 lb (83.9 kg)   BMI 32.77 kg/m   Physical Exam:  General: Alert and oriented.  No acute distress. Gait: Right-sided antalgic gait.  Evaluation of the right hip demonstrates no obvious deformity.  She does have some atrophy of the right quadriceps.  The left.  Tolerates 25 degrees of external rotation, 20 degrees of internal rotation with mild discomfort in the inner thigh area.  No pain with resisted abduction.  She does have some pain in the inner thigh with resisted adduction.  Sensation is intact distally.  No pain with straight leg raise.  She is able to hold her leg fully extended.    IMAGING: I personally ordered and reviewed  the following images   AP pelvis and right hip x-rays were obtained in clinic today and demonstrates no acute injury.  Mild to moderate degenerative changes noted within the right hip.  Minimal osteophytes are noted, but there is loss of her joint space.  There is no obvious AVN or femoral head collapse.  Impression: Mild to moderate right hip arthritis.   New Medications:  Meds ordered this encounter  Medications  . predniSONE (STERAPRED UNI-PAK 21 TAB) 10 MG (21) TBPK tablet    Sig: 10 mg DS 12 as directed    Dispense:  48 tablet    Refill:  0      Mordecai Rasmussen, MD  08/28/2020 2:53 PM

## 2020-08-29 ENCOUNTER — Ambulatory Visit: Payer: Medicare Other | Admitting: Orthopedic Surgery

## 2020-09-11 ENCOUNTER — Encounter: Payer: Self-pay | Admitting: Orthopedic Surgery

## 2020-09-11 ENCOUNTER — Ambulatory Visit (INDEPENDENT_AMBULATORY_CARE_PROVIDER_SITE_OTHER): Payer: Medicare Other | Admitting: Orthopedic Surgery

## 2020-09-11 ENCOUNTER — Other Ambulatory Visit: Payer: Self-pay

## 2020-09-11 VITALS — BP 154/86 | HR 77 | Ht 63.0 in | Wt 185.0 lb

## 2020-09-11 DIAGNOSIS — R1031 Right lower quadrant pain: Secondary | ICD-10-CM

## 2020-09-11 MED ORDER — DICLOFENAC SODIUM 1 % EX GEL: 2.0000 g | Freq: Four times a day (QID) | CUTANEOUS | 1 refills | Status: DC

## 2020-09-11 MED ORDER — PREDNISONE 10 MG (21) PO TBPK: ORAL_TABLET | ORAL | 0 refills | Status: DC

## 2020-09-11 NOTE — Progress Notes (Signed)
Orthopaedic Clinic Return  Assessment: Julia Martinez is a 70 y.o. female with the following: Persistent right groin pain  Plan: Patient was reevaluated in clinic today.  I do not think that she has any intra articular hip pathology.  She thinks that the previous dosing of prednisone did provide some improvement in her symptoms, although she was unable to complete the Dosepak.  As a result, she is interested in a another course of prednisone.  I have also recommended Voltaren topical gel to be used in the right groin area and attempts of improving her symptoms.  We discussed the possibility of obtaining an MRI, to further elucidate the issue.  However, I did state that I do not think that this would change her treatment plan, as a groin injury would not require surgical intervention.  As a result, she is willing to wait on further imaging or consideration for a right hip injection.  If she is continuing to have issues following the Dosepak, in the next 2-3 weeks, I have asked her to contact clinic.  We will discuss obtaining an MRI in more detail at that time.  Meds ordered this encounter  Medications  . diclofenac Sodium (VOLTAREN) 1 % GEL    Sig: Apply 2 g topically 4 (four) times daily.    Dispense:  150 g    Refill:  1  . predniSONE (STERAPRED UNI-PAK 21 TAB) 10 MG (21) TBPK tablet    Sig: 10 mg DS 12 as directed    Dispense:  48 tablet    Refill:  0      Follow-up: Return if symptoms worsen or fail to improve.   Subjective:  Chief Complaint  Patient presents with  . Groin Pain    Still having bad pain with her first few steps and feels she might fall. Also thinks she's favoring the leg, did not finish prednisone.     History of Present Illness: Julia Martinez is a 70 y.o. female who presents for repeat evaluation of right groin pain.  She states that she has the same pain, and the medial aspect of her thigh.  No recent injuries.  She did complete at least half of the  prednisone Dosepak, but did not complete this because she is dealing with family medical issues.  She feels unsteady on her feet.  She does use a cane to assist with ambulation.  When she is not bearing weight, her pain is minimal.  However, when she tries to ambulate, the pain is severe.  She reports that while taking the prednisone, she was able to ambulate with minimal pain, but this did not continue.  Review of Systems: No fevers or chills No numbness or tingling No chest pain No shortness of breath No bowel or bladder dysfunction No GI distress No headaches    Objective: BP (!) 154/86   Pulse 77   Ht 5' 3"  (1.6 m)   Wt 185 lb (83.9 kg)   BMI 32.77 kg/m   Physical Exam:  Alert and oriented.  No acute distress.  Seated in wheelchair.  She tolerates external and internal rotation of the right hip.  Pain with resisted hip adduction, as well as hip abduction.  No pain with resisted hip flexion.  No pain with knee extension.  When evaluating the left hip, she has no pain.  No pain with resisted hip adduction and abduction.  No pain with knee extension.  Sensation is intact distally.  IMAGING: I personally ordered  and reviewed the following images:  No new imaging obtained today.  Mordecai Rasmussen, MD 09/11/2020 10:26 AM

## 2020-09-26 ENCOUNTER — Telehealth: Payer: Self-pay | Admitting: Cardiology

## 2020-09-26 ENCOUNTER — Ambulatory Visit: Payer: Medicare Other | Admitting: Student

## 2020-09-26 MED ORDER — DILTIAZEM HCL ER COATED BEADS 240 MG PO CP24: 240.0000 mg | ORAL_CAPSULE | Freq: Every day | ORAL | 3 refills | Status: DC

## 2020-09-26 NOTE — Telephone Encounter (Signed)
    By review of records, it appears Dr. Domenic Polite increased Cardizem CD from 240 mg daily to 300 mg daily at the time of her office visit in 07/2020 as she was having worsening palpitations in the setting of increased stress. If she just recently started the increased dose and feels weak on this, can reduce back to her prior dosing of 240 mg daily. Would encourage her to check her HR/BP at home if able to monitor response.   Signed, Erma Heritage, PA-C 09/26/2020, 12:51 PM Pager: [phone]

## 2020-09-26 NOTE — Telephone Encounter (Signed)
Pt stated that she started dilttiazem 300 mg tablets on 09/23/2020. Pt denies SOB, CP, or pressure. Pt states that she feels weak/fatigued and believes the medication is too strong for her. Please advise.

## 2020-09-26 NOTE — Telephone Encounter (Signed)
Pt verbalized agreement with reducing diltiazem from 300 mg to 240 mg daily. Pt also stated that she would keep a check on her HR/BP at home.  Prescription sent to [facility] per pt request.

## 2020-09-26 NOTE — Telephone Encounter (Signed)
Per phone call from pt- she would like to speak with the nurse, states she thinks the diltiazem (CARDIZEM CD) 300 MG 24 hr capsule [[id number]]  Is to strong for her and that it's making her extremely weak.

## 2020-10-01 DIAGNOSIS — F331 Major depressive disorder, recurrent, moderate: Secondary | ICD-10-CM | POA: Diagnosis not present

## 2020-10-01 DIAGNOSIS — F411 Generalized anxiety disorder: Secondary | ICD-10-CM | POA: Diagnosis not present

## 2020-10-03 IMAGING — DX DG ABDOMEN 1V
2 series · 2 of 2 positions shown · non-contrast
Comparison: 09/13/2019

CLINICAL DATA: Retained Givens capsule, pre colonoscopy

EXAM:
ABDOMEN - 1 VIEW

[abdomen kub (1 of 2)]
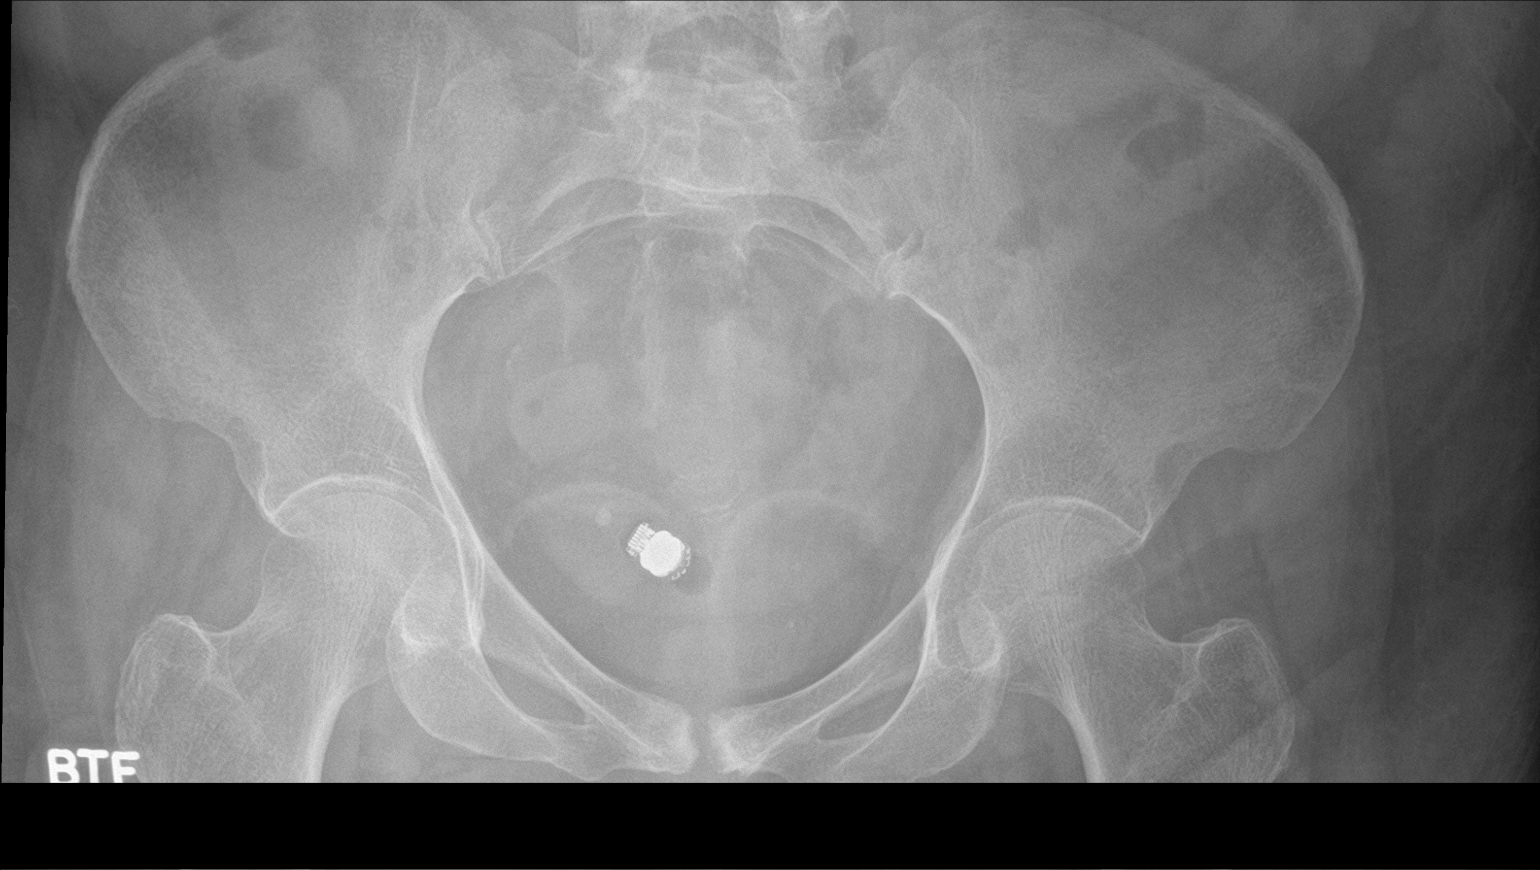

[abdomen kub (2 of 2)]
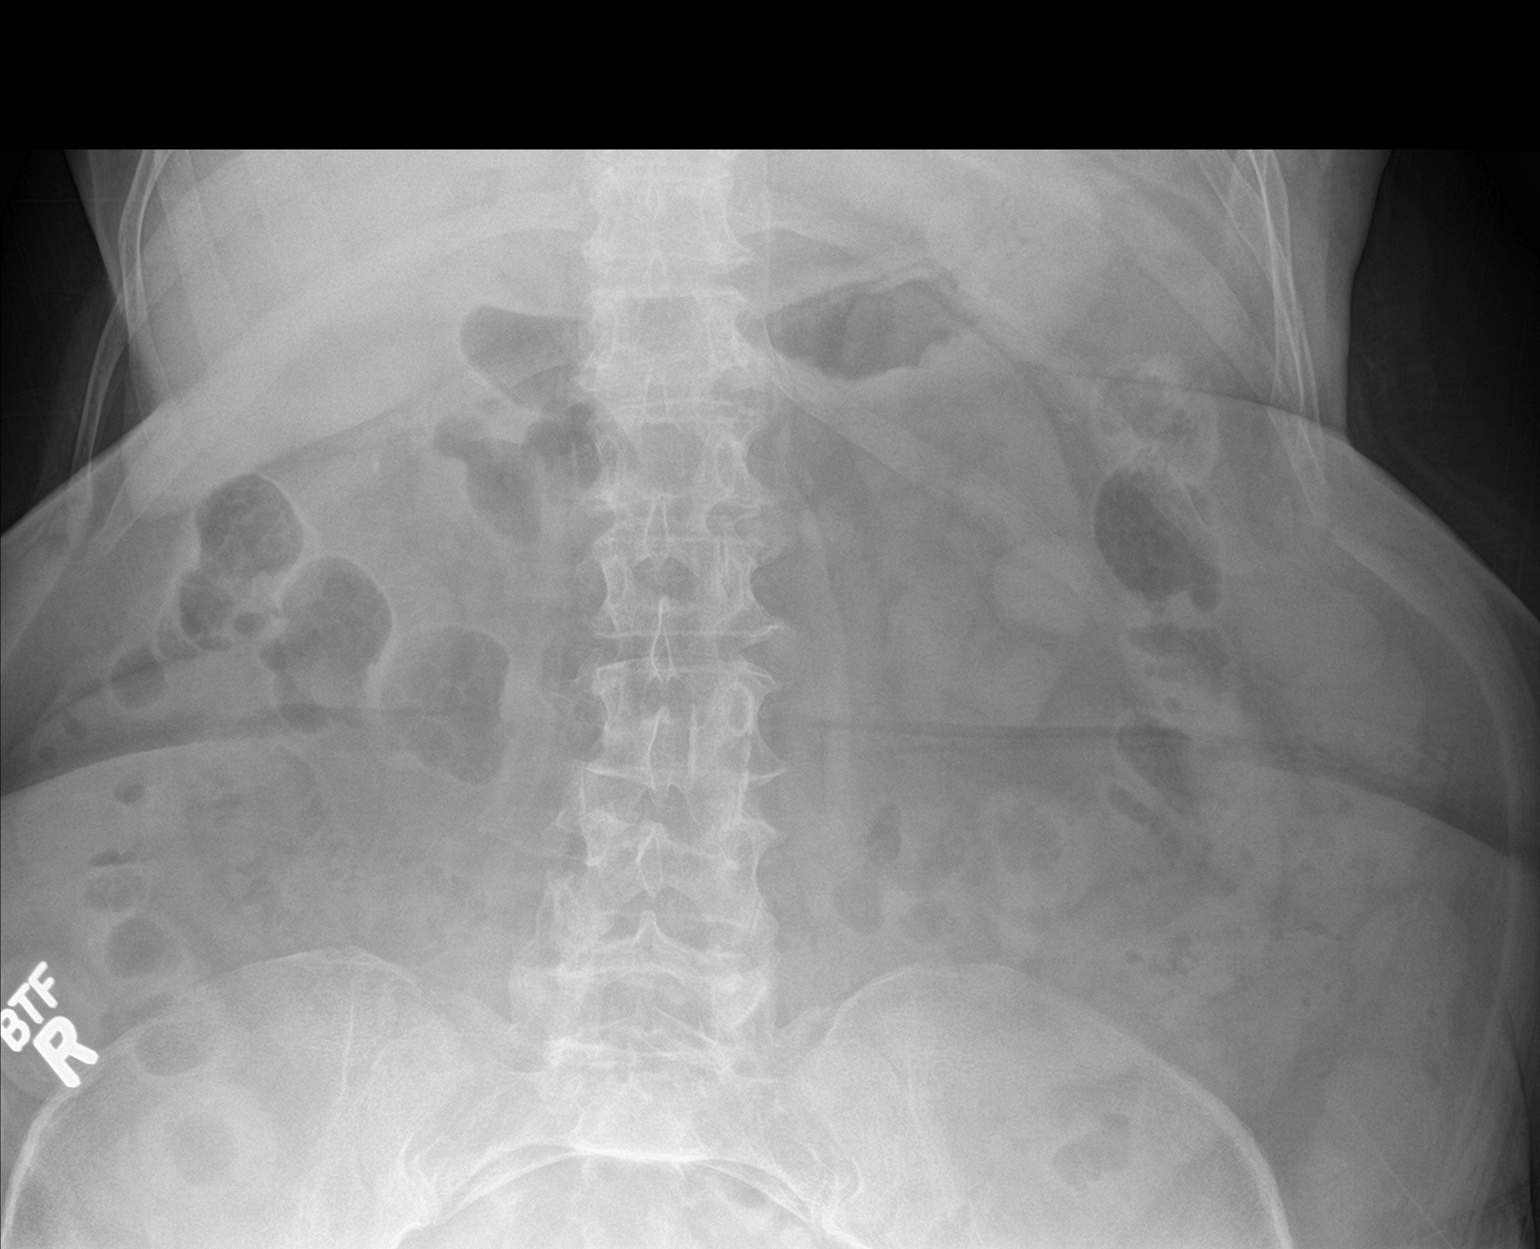

[2 of 2 positions shown; findings below may reference images not displayed]

FINDINGS: Givens capsule is again identified in the pelvis, just RIGHT of
midline slightly more medial than on the previous exam.

This could be within distal small bowel loop or, based on position
of rectosigmoid junction on CT, within the distal colon.

Nonobstructive bowel gas pattern.

Bones demineralized.
IMPRESSION: Givens capsule is again identified at the RIGHT pelvis as above.

## 2020-10-29 NOTE — Progress Notes (Signed)
Cardiology Office Note    Date:  10/30/2020   ID:  Julia Martinez, DOB September 13, 1950, MRN [id number]  PCP:  Pablo Lawrence, NP  Cardiologist: Rozann Lesches, MD    Chief Complaint  Patient presents with   Follow-up    6 month visit    History of Present Illness:    Julia Martinez is a 70 y.o. female with past medical history of HTN, pSVT and GERD who presents to the office today for 61-monthfollow-up.   She was last examined by Dr. MDomenic Politein 07/2020 and reported being under increased stress at that time and was experiencing more palpitations. She was continued on Toprol-XL and Cardizem CD was increased from 2417mdaily to 30053maily. She called the office several months later reporting she felt the higher dose was too strong and this was reduced back to 240m52mily.   In talking with the patient today, she reports being under increased stress given the declining health of her son. He is being followed by Palliative Care and is overall been doing quite well since at home. She was prescribed Effexor by her PCP but has not yet started this. She reports her breathing has overall been stable and denies any recent chest pain. Does experience an odd taste and burning along her throat after food consumption at times. She does report intermittent palpitations but says they have overall improved since her last visit. She does try to limit her caffeine intake. No reported orthopnea or PND. She does experience intermittent lower extremity edema. Does not add salt to her food but reports they typically get takeout from local restaurants on a nightly basis.  She is planning to go to the beach next week.   Past Medical History:  Diagnosis Date   Anemia    Depression    Essential hypertension    OSA (obstructive sleep apnea)    Mild by sleep study 2019   PSVT (paroxysmal supraventricular tachycardia) ([facility])[place] Ulcer of small intestine     Past Surgical History:  Procedure Laterality Date    BIOPSY  10/21/2019   Procedure: BIOPSY;  Surgeon: RourDaneil Dolin;  [facility];  Service: Endoscopy;;  small bowel   CESAREAN SECTION     COLONOSCOPY  2009   single external hemorrhoid tag, otherwise normal rectum. Numerous left-sided diverticula, abnormal IC valve and TI suggestive of Crohn's disease s/p biopsy. Biopsies with non-specific ulcerations. CTE then revealing chronic inflammatory change of distal TI, concerning for SB Crohn's. Did not start therapy due to absence of clinical symptoms [facility]   COLONOSCOPY N/A 07/21/2017   Fields: Multiple ileal diverticulum, moderate diverticulosis in the entire examined colon, small external hemorrhoids.   COLONOSCOPY N/A 10/21/2019   Procedure: COLONOSCOPY;  Surgeon: RourDaneil Dolin;  [facility];  Service: Endoscopy;  Laterality: N/A;  2:15pm   ESOPHAGOGASTRODUODENOSCOPY N/A 07/20/2017   Fields: Normal esophagus, small hiatal hernia, normal examined duodenum.  No blood in the upper GI tract.   GIVENS CAPSULE STUDY N/A 08/30/2019   Fields: Small bowel AVMs, erosions and ulcerated distal small bowel, likely in the distal jejunum/proximal ileum, study not complete the cecum.   WISDOM TOOTH EXTRACTION      Current Medications: Outpatient Medications Prior to Visit  Medication Sig Dispense Refill   brimonidine (ALPHAGAN) 0.2 % ophthalmic solution 1 drop 2 (two) times daily.     cyanocobalamin (,VITAMIN B-12,) 1000 MCG/ML injection Inject into  the muscle every 30 (thirty) days.     diclofenac Sodium (VOLTAREN) 1 % GEL Apply 2 g topically 4 (four) times daily. 150 g 1   diltiazem (CARDIZEM CD) 240 MG 24 hr capsule Take 1 capsule (240 mg total) by mouth daily. 90 capsule 3   HUMIRA PEN-CD/UC/HS STARTER 80 MG/0.8ML PNKT Inject into the skin every 14 (fourteen) days.     LATANOPROST OP Apply to eye.     metoprolol succinate (TOPROL-XL) 100 MG 24 hr tablet TAKE ONE TABLET (100MG TOTAL) BY MOUTH DAILY.  TAKE WITH OR IMMEDIATELY FOLLOWING A MEAL. TAKING A TOTAL OF 150MG DAILY 90 tablet 3   venlafaxine XR (EFFEXOR-XR) 37.5 MG 24 hr capsule Take 37.5 mg by mouth daily.     predniSONE (STERAPRED UNI-PAK 21 TAB) 10 MG (21) TBPK tablet 10 mg DS 12 as directed (Patient not taking: Reported on 09/11/2020) 48 tablet 0   predniSONE (STERAPRED UNI-PAK 21 TAB) 10 MG (21) TBPK tablet 10 mg DS 12 as directed 48 tablet 0   No facility-administered medications prior to visit.     Allergies:   Penicillins   Social History   Socioeconomic History   Marital status: Married    Spouse name: Not on file   Number of children: 3   Years of education: Not on file   Highest education level: Not on file  Occupational History   Occupation: paraprofessional  Tobacco Use   Smoking status: Never   Smokeless tobacco: Never  Vaping Use   Vaping Use: Never used  Substance and Sexual Activity   Alcohol use: No   Drug use: No   Sexual activity: Not Currently  Other Topics Concern   Not on file  Social History Narrative   Not on file   Social Determinants of Health   Financial Resource Strain: Not on file  Food Insecurity: Not on file  Transportation Needs: Not on file  Physical Activity: Not on file  Stress: Not on file  Social Connections: Not on file     Family History:  The patient's family history includes Depression in her mother; Emphysema in her father; Prader-Willi syndrome in her son; Ulcers in her mother.   Review of Systems:    Please see the history of present illness.     All other systems reviewed and are otherwise negative except as noted above.   Physical Exam:    VS:  BP (!) 142/92   Pulse 69   Ht 5' 3"  (1.6 m)   Wt 185 lb 6.4 oz (84.1 kg)   SpO2 96%   BMI 32.84 kg/m    General: Well developed, well nourished,female appearing in no acute distress. Tearful at times when talking about the health of her son.  Head: Normocephalic, atraumatic. Neck: No carotid bruits. JVD not  elevated.  Lungs: Respirations regular and unlabored, without wheezes or rales.  Heart: Regular rate and rhythm. No S3 or S4.  No murmur, no rubs, or gallops appreciated. Abdomen: Appears non-distended. No obvious abdominal masses. Msk:  Strength and tone appear normal for age. No obvious joint deformities or effusions. Extremities: No clubbing or cyanosis. No pitting edema.  Distal pedal pulses are 2+ bilaterally. Neuro: Alert and oriented X 3. Moves all extremities spontaneously. No focal deficits noted. Psych:  Responds to questions appropriately with a normal affect. Skin: No rashes or lesions noted  Wt Readings from Last 3 Encounters:  10/30/20 185 lb 6.4 oz (84.1 kg)  09/11/20 185 lb (83.9 kg)  08/28/20 185 lb (83.9 kg)     Studies/Labs Reviewed:   EKG:  EKG is not ordered today.   Recent Labs: 01/02/2020: ALT 18; BUN 21; Creatinine, Ser 0.90; Potassium 4.3; Sodium 138 08/27/2020: Hemoglobin 14.5; Platelets 271   Lipid Panel No results found for: CHOL, TRIG, HDL, CHOLHDL, VLDL, LDLCALC, LDLDIRECT  Additional studies/ records that were reviewed today include:   Echocardiogram: 10/01/2017 Study Conclusions   - Left ventricle: The cavity size was normal. Wall thickness was    normal. Systolic function was normal. The estimated ejection    fraction was in the range of 60% to 65%. Wall motion was normal;    there were no regional wall motion abnormalities. Doppler    parameters are consistent with abnormal left ventricular    relaxation (grade 1 diastolic dysfunction). Doppler parameters    are consistent with high ventricular filling pressure.  - Aortic valve: Mildly calcified annulus. Trileaflet; mildly    thickened leaflets. Valve area (VTI): 1.81 cm^2. Valve area    (Vmax): 1.53 cm^2. Valve area (Vmean): 1.54 cm^2.  - Mitral valve: Mildly calcified annulus. Normal thickness leaflets    . There was mild regurgitation.  - Left atrium: The atrium was mildly dilated.  -  Right ventricle: The cavity size was mildly to moderately    dilated.  - Right atrium: The atrium was mildly dilated.  - Atrial septum: No defect or patent foramen ovale was identified.  - Pulmonary arteries: Systolic pressure was moderately increased.    PA peak pressure: 50 mm Hg (S).  - Technically adequate study.   Assessment:    1. Essential hypertension   2. Palpitations   3. Gastroesophageal reflux disease, unspecified whether esophagitis present      Plan:   In order of problems listed above:  1. HTN - Her BP was initially recorded at 178/100, rechecked and at 142/92. Was previously well-controlled when checked by her PCP. I suspect her stress and anxiety are contributing to this and she is planning to start Effexor as recently prescribed by her PCP. I encouraged her to follow BP readings at home and report back if BP remains elevated. Continue Cardizem CD 262m daily and Toprol-XL 1552mdaily. If BP remains elevated, would consider the addition of Losartan or Chlorthalidone.   2. Palpitations - She reports her symptoms have improved and she is trying to limit her caffeine intake. Continue Cardizem CD 24048maily (previously intolerant to higher dosing) and Toprol-XL 150m75mily.   3. GERD - Her symptoms seem most consistent with GERD as outlined above. Will provide Rx for Protonix 20mg64mly. Would recommend she follow-up with GI if symptoms persist.     Medication Adjustments/Labs and Tests Ordered: Current medicines are reviewed at length with the patient today.  Concerns regarding medicines are outlined above.  Medication changes, Labs and Tests ordered today are listed in the Patient Instructions below. Patient Instructions  Medication Instructions:   Start Protonix 20mg 53my.   Labwork:  None  Testing/Procedures:  None  Follow-Up:  With Dr. McDoweDomenic PoliteittaTanzaniamonths.   Any Other Special Instructions Will Be Listed Below (If  Applicable).  Report back if your swelling worsens and we can provide a prescription for as needed Lasix.    If you need a refill on your cardiac medications before your next appointment, please call your pharmacy.   Signed, BrittaErma Heritage  10/30/2020 4:44 PM    [facility]  [address] [place], [place] [address] Phone: [phone] Fax: [phone]

## 2020-10-30 ENCOUNTER — Encounter: Payer: Self-pay | Admitting: Student

## 2020-10-30 ENCOUNTER — Ambulatory Visit (INDEPENDENT_AMBULATORY_CARE_PROVIDER_SITE_OTHER): Payer: Medicare Other | Admitting: Student

## 2020-10-30 ENCOUNTER — Other Ambulatory Visit: Payer: Self-pay

## 2020-10-30 VITALS — BP 142/92 | HR 69 | Ht 63.0 in | Wt 185.4 lb

## 2020-10-30 DIAGNOSIS — K219 Gastro-esophageal reflux disease without esophagitis: Secondary | ICD-10-CM | POA: Diagnosis not present

## 2020-10-30 DIAGNOSIS — I10 Essential (primary) hypertension: Secondary | ICD-10-CM

## 2020-10-30 DIAGNOSIS — R002 Palpitations: Secondary | ICD-10-CM

## 2020-10-30 MED ORDER — PANTOPRAZOLE SODIUM 20 MG PO TBEC: 20.0000 mg | DELAYED_RELEASE_TABLET | Freq: Every day | ORAL | 3 refills | Status: DC

## 2020-10-30 NOTE — Patient Instructions (Signed)
Medication Instructions:   Start Protonix 75m daily.   Labwork:  None  Testing/Procedures:  None  Follow-Up:  With Dr. MDomenic Politeor BTanzaniain 3 months.   Any Other Special Instructions Will Be Listed Below (If Applicable).  Report back if your swelling worsens and we can provide a prescription for as needed Lasix.    If you need a refill on your cardiac medications before your next appointment, please call your pharmacy.

## 2020-11-03 DIAGNOSIS — U071 COVID-19: Secondary | ICD-10-CM | POA: Diagnosis not present

## 2020-11-07 ENCOUNTER — Ambulatory Visit: Payer: Medicare Other | Admitting: Student

## 2020-11-13 ENCOUNTER — Encounter: Payer: Self-pay | Admitting: Internal Medicine

## 2020-11-16 ENCOUNTER — Ambulatory Visit: Payer: Medicare Other | Admitting: Student

## 2020-11-21 NOTE — Progress Notes (Signed)
This pt had her procedure done on 08/28/2020. Dr. Gala Romney and Elmo Putt had already spoken with the pt.

## 2021-01-08 ENCOUNTER — Other Ambulatory Visit: Payer: Self-pay

## 2021-01-08 ENCOUNTER — Ambulatory Visit (INDEPENDENT_AMBULATORY_CARE_PROVIDER_SITE_OTHER): Payer: Medicare Other | Admitting: Internal Medicine

## 2021-01-08 ENCOUNTER — Encounter: Payer: Self-pay | Admitting: Internal Medicine

## 2021-01-08 ENCOUNTER — Other Ambulatory Visit: Payer: Self-pay | Admitting: Internal Medicine

## 2021-01-08 VITALS — BP 134/80 | HR 64 | Temp 96.9°F | Ht 61.0 in | Wt 182.4 lb

## 2021-01-08 DIAGNOSIS — D500 Iron deficiency anemia secondary to blood loss (chronic): Secondary | ICD-10-CM

## 2021-01-08 DIAGNOSIS — K633 Ulcer of intestine: Secondary | ICD-10-CM | POA: Diagnosis not present

## 2021-01-08 DIAGNOSIS — D509 Iron deficiency anemia, unspecified: Secondary | ICD-10-CM | POA: Diagnosis not present

## 2021-01-08 NOTE — Patient Instructions (Signed)
Resume protonix 20 mg daily  Continue Humira  CBC, ferritin today  Ov 6 months

## 2021-01-08 NOTE — Progress Notes (Signed)
Primary Care Physician:  Pablo Lawrence, NP Primary Gastroenterologist:  Dr. Gala Romney  Pre-Procedure History & Physical: HPI:  Julia Martinez is a 70 y.o. female here for follow-up of ileitis secondary to Crohn's disease and iron deficiency anemia.  She has been on Humira now for well over a year.  She has no bowel symptoms at this time.  No abdominal pain no diarrhea tenesmus rectal bleeding no nausea or vomiting.  Stopped budesonide sometime ago.  Does take Protonix 20 mg daily for GERD which is very well controlled.  Followed by oncology for monoclonal gammopathy.  Her iron deficiency appears to have resolved.  Iron ferritin 50 back in April of this year which is the best its been in the past year.  CBC at the same time demonstrated H&H of 14.5/45.3 MCV 92.4 platelets 271,000. Patient states in retrospect.  She has not felt this good from a GI standpoint in quite a while.  She feels that Humira is helping her to a considerable degree and that her disease is in remission.  She is not taking any nonsteroidal agents.  Her major issue in her life now as a considerable amount of stress taking care of her son who has Prader Willie syndrome which have been associated with a significant behavioral issues.  Past Medical History:  Diagnosis Date   Anemia    Depression    Essential hypertension    OSA (obstructive sleep apnea)    Mild by sleep study 2019   PSVT (paroxysmal supraventricular tachycardia) ([place])    Ulcer of small intestine     Past Surgical History:  Procedure Laterality Date   BIOPSY  10/21/2019   Procedure: BIOPSY;  Surgeon: Daneil Dolin, MD;  [facility];  Service: Endoscopy;;  small bowel   CESAREAN SECTION     COLONOSCOPY  2009   single external hemorrhoid tag, otherwise normal rectum. Numerous left-sided diverticula, abnormal IC valve and TI suggestive of Crohn's disease s/p biopsy. Biopsies with non-specific ulcerations. CTE then revealing chronic inflammatory  change of distal TI, concerning for SB Crohn's. Did not start therapy due to absence of clinical symptoms and recommended [facility] evaluation.   COLONOSCOPY N/A 07/21/2017   Fields: Multiple ileal diverticulum, moderate diverticulosis in the entire examined colon, small external hemorrhoids.   COLONOSCOPY N/A 10/21/2019   Procedure: COLONOSCOPY;  Surgeon: Daneil Dolin, MD;  [facility];  Service: Endoscopy;  Laterality: N/A;  2:15pm   ESOPHAGOGASTRODUODENOSCOPY N/A 07/20/2017   Fields: Normal esophagus, small hiatal hernia, normal examined duodenum.  No blood in the upper GI tract.   GIVENS CAPSULE STUDY N/A 08/30/2019   Fields: Small bowel AVMs, erosions and ulcerated distal small bowel, likely in the distal jejunum/proximal ileum, study not complete the cecum.   WISDOM TOOTH EXTRACTION      Prior to Admission medications   Medication Sig Start Date End Date Taking? Authorizing Provider  brimonidine (ALPHAGAN) 0.2 % ophthalmic solution 1 drop 2 (two) times daily. 01/02/20  Yes [provider]  diltiazem (CARDIZEM CD) 240 MG 24 hr capsule Take 1 capsule (240 mg total) by mouth daily. 09/26/20  Yes Strader, Fransisco Hertz, PA-C  HUMIRA PEN-CD/UC/HS STARTER 80 MG/0.8ML PNKT Inject into the skin every 14 (fourteen) days. 12/02/19  Yes [provider]  LATANOPROST OP Apply to eye daily.   Yes [provider]  metoprolol succinate (TOPROL-XL) 100 MG 24 hr tablet TAKE ONE TABLET (100MG TOTAL) BY MOUTH DAILY. TAKE WITH OR  IMMEDIATELY FOLLOWING A MEAL. TAKING A TOTAL OF 150MG DAILY Patient taking differently: 100 mg. Takes 100 mg daily. 08/14/20  Yes Satira Sark, MD  pantoprazole (PROTONIX) 20 MG tablet Take 1 tablet (20 mg total) by mouth daily. Patient taking differently: Take 20 mg by mouth as needed. 10/30/20  Yes Strader, [place] M, PA-C  cyanocobalamin (,VITAMIN B-12,) 1000 MCG/ML injection Inject into the muscle every 30 (thirty) days. Patient not taking:  Reported on 01/08/2021 01/04/20   [provider]  diclofenac Sodium (VOLTAREN) 1 % GEL Apply 2 g topically 4 (four) times daily. Patient not taking: Reported on 01/08/2021 09/11/20   Mordecai Rasmussen, MD  venlafaxine XR (EFFEXOR-XR) 37.5 MG 24 hr capsule Take 37.5 mg by mouth daily. Patient not taking: Reported on 01/08/2021 10/01/20   [provider]    Allergies as of 01/08/2021 - Review Complete 01/08/2021  Allergen Reaction Noted   Penicillins Nausea Only 10/06/2013    Family History  Problem Relation Age of Onset   Depression Mother    Ulcers Mother    Emphysema Father    Prader-Willi syndrome Son    Colon cancer Neg Hx    Colon polyps Neg Hx     Social History   Socioeconomic History   Marital status: Married    Spouse name: Not on file   Number of children: 3   Years of education: Not on file   Highest education level: Not on file  Occupational History   Occupation: paraprofessional  Tobacco Use   Smoking status: Never   Smokeless tobacco: Never  Vaping Use   Vaping Use: Never used  Substance and Sexual Activity   Alcohol use: No   Drug use: No   Sexual activity: Not Currently  Other Topics Concern   Not on file  Social History Narrative   Not on file   Social Determinants of Health   Financial Resource Strain: Not on file  Food Insecurity: Not on file  Transportation Needs: Not on file  Physical Activity: Not on file  Stress: Not on file  Social Connections: Not on file  Intimate Partner Violence: Not on file    Review of Systems: See HPI, otherwise negative ROS  Physical Exam: BP 134/80   Pulse 64   Temp (!) 96.9 F (36.1 C) (Temporal)   Ht 5' 1"  (1.549 m)   Wt 182 lb 6.4 oz (82.7 kg)   BMI 34.46 kg/m  General:   Alert,   pleasant and cooperative in NAD Neck:  Supple; no masses or thyromegaly. No significant cervical adenopathy. Lungs:  Clear throughout to auscultation.   No wheezes, crackles, or rhonchi. No acute  distress. Heart:  Regular rate and rhythm; no murmurs, clicks, rubs,  or gallops. Abdomen: Non-distended, normal bowel sounds.  Soft and nontender without appreciable mass or hepatosplenomegaly.  Pulses:  Normal pulses noted. Extremities:  Without clubbing or edema.  Impression/Plan: 70 year old lady diagnosed previously with Crohn's ileitis with secondary iron deficiency anemia.  Clinically, doing extremely well on Humira at this time.  Iron deficiency anemia has improved if not resolved earlier this year. Overall, it is very gratifying to see her GI symptoms, anemia responding to therapy. We need to reassess her iron studies.  GERD-well-controlled on Protonix 20 mg daily.  Recommendations:  Continue protonix 20 mg daily  Continue Humira  CBC, ferritin today  Ov 6 months     Notice: This dictation was prepared with Dragon dictation along with smaller phrase technology.  Any transcriptional errors that result from this process are unintentional and may not be corrected upon review.

## 2021-01-14 ENCOUNTER — Other Ambulatory Visit (HOSPITAL_COMMUNITY)
Admission: RE | Admit: 2021-01-14 | Discharge: 2021-01-14 | Disposition: A | Discharge status: Home / Self Care | Payer: Medicare Other | Source: Ambulatory Visit | Attending: Internal Medicine | Admitting: Internal Medicine

## 2021-01-14 ENCOUNTER — Other Ambulatory Visit: Payer: Self-pay

## 2021-01-14 ENCOUNTER — Other Ambulatory Visit: Payer: Self-pay | Admitting: Internal Medicine

## 2021-01-14 DIAGNOSIS — D500 Iron deficiency anemia secondary to blood loss (chronic): Secondary | ICD-10-CM | POA: Diagnosis not present

## 2021-01-14 DIAGNOSIS — D509 Iron deficiency anemia, unspecified: Secondary | ICD-10-CM

## 2021-01-14 LAB — CBC WITH DIFFERENTIAL/PLATELET
Abs Immature Granulocytes: 0.03 10*3/uL (ref 0.00–0.07)
Basophils Absolute: 0.1 10*3/uL (ref 0.0–0.1)
Basophils Relative: 1 %
Eosinophils Absolute: 0.1 10*3/uL (ref 0.0–0.5)
Eosinophils Relative: 2 %
HCT: 44 % (ref 36.0–46.0)
Hemoglobin: 14.3 g/dL (ref 12.0–15.0)
Immature Granulocytes: 0 %
Lymphocytes Relative: 19 %
Lymphs Abs: 1.4 10*3/uL (ref 0.7–4.0)
MCH: 29.5 pg (ref 26.0–34.0)
MCHC: 32.5 g/dL (ref 30.0–36.0)
MCV: 90.7 fL (ref 80.0–100.0)
Monocytes Absolute: 0.7 10*3/uL (ref 0.1–1.0)
Monocytes Relative: 9 %
Neutro Abs: 5.3 10*3/uL (ref 1.7–7.7)
Neutrophils Relative %: 69 %
Platelets: 200 10*3/uL (ref 150–400)
RBC: 4.85 MIL/uL (ref 3.87–5.11)
RDW: 13.8 % (ref 11.5–15.5)
WBC: 7.7 10*3/uL (ref 4.0–10.5)
nRBC: 0 % (ref 0.0–0.2)

## 2021-01-14 LAB — FERRITIN: Ferritin: 43 ng/mL (ref 11–307)

## 2021-01-14 NOTE — Progress Notes (Signed)
Pt had done this morning.

## 2021-01-28 NOTE — Progress Notes (Signed)
Not sure why duplicate orders are in chart, but results for this lab is in patient's chart.

## 2021-02-04 IMAGING — DX DG ABDOMEN 1V
2 series · 2 of 2 positions shown · non-contrast
Comparison: October 21, 2019.

CLINICAL DATA: Abdominal cramps, endoscopy capsule.

EXAM:
ABDOMEN - 1 VIEW

[abdomen kub (1 of 2)]
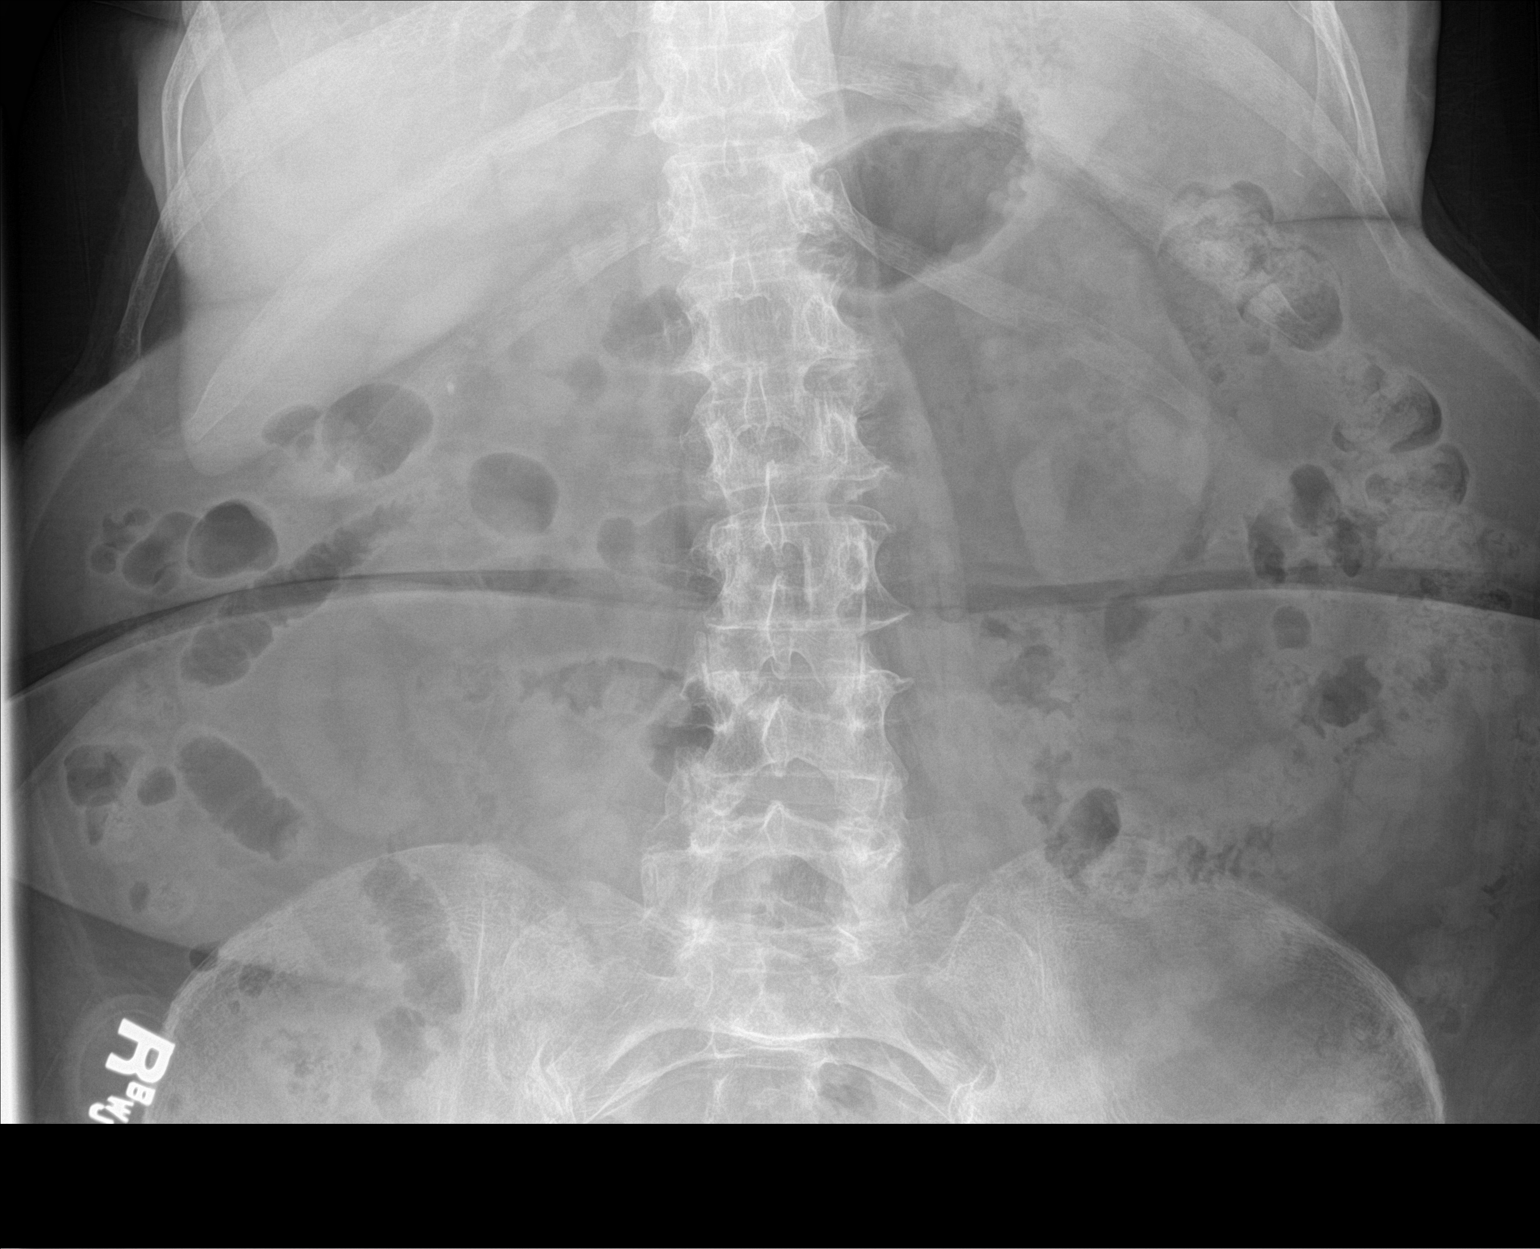

[abdomen kub (2 of 2)]
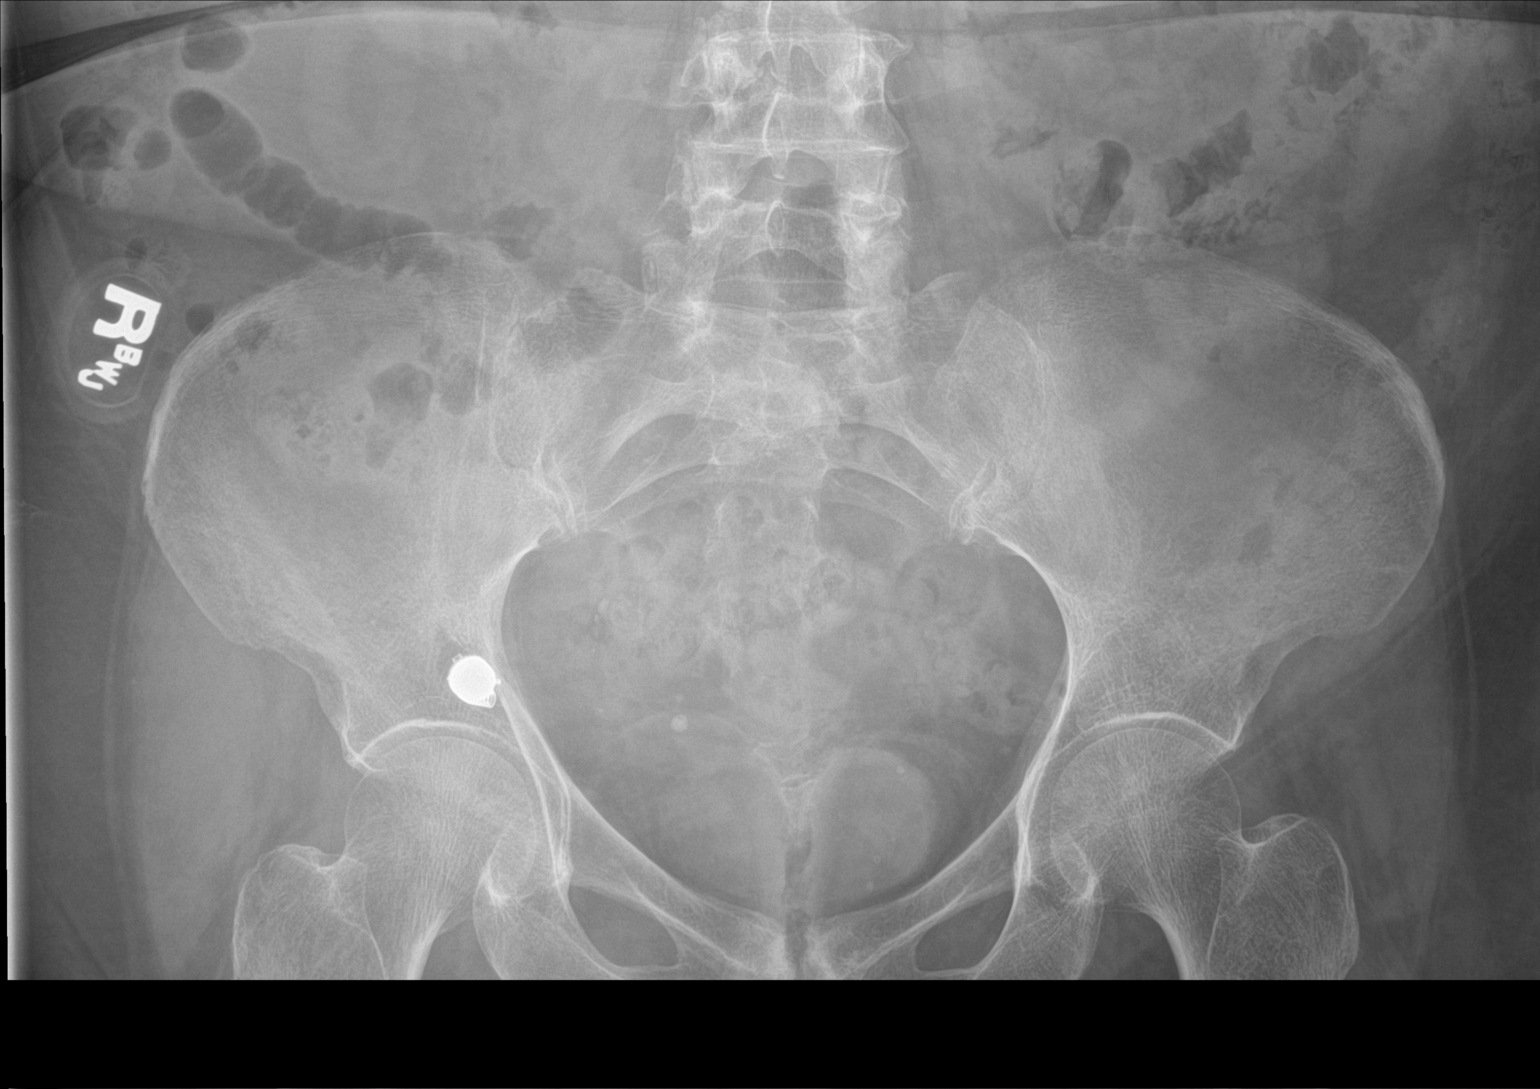

[2 of 2 positions shown; findings below may reference images not displayed]

FINDINGS: The bowel gas pattern is normal. Probable small right renal calculus
is noted. Phleboliths are noted in the pelvis. Endoscopy capsule is
seen in right lower quadrant. It is uncertain if it is in large or
small bowel.
IMPRESSION: Probable small right renal calculus. Endoscopy capsule is seen in
right lower quadrant, but is uncertain if it is in large or small
bowel. No evidence of bowel obstruction or ileus.

## 2021-02-07 ENCOUNTER — Encounter: Payer: Self-pay | Admitting: Student

## 2021-02-07 ENCOUNTER — Ambulatory Visit (INDEPENDENT_AMBULATORY_CARE_PROVIDER_SITE_OTHER): Payer: Medicare Other | Admitting: Student

## 2021-02-07 ENCOUNTER — Other Ambulatory Visit: Payer: Self-pay

## 2021-02-07 VITALS — BP 156/92 | HR 72 | Ht 62.0 in | Wt 181.6 lb

## 2021-02-07 DIAGNOSIS — I10 Essential (primary) hypertension: Secondary | ICD-10-CM

## 2021-02-07 DIAGNOSIS — Z79899 Other long term (current) drug therapy: Secondary | ICD-10-CM | POA: Diagnosis not present

## 2021-02-07 DIAGNOSIS — K219 Gastro-esophageal reflux disease without esophagitis: Secondary | ICD-10-CM

## 2021-02-07 DIAGNOSIS — R002 Palpitations: Secondary | ICD-10-CM | POA: Diagnosis not present

## 2021-02-07 MED ORDER — LOSARTAN POTASSIUM 25 MG PO TABS: 25.0000 mg | ORAL_TABLET | Freq: Every day | ORAL | 3 refills | Status: DC

## 2021-02-07 NOTE — Progress Notes (Signed)
Cardiology Office Note    Date:  02/07/2021   ID:  Julia Martinez, DOB 1951/04/14, MRN [id number]  PCP:  Pablo Lawrence, NP  Cardiologist: Rozann Lesches, MD    Chief Complaint  Patient presents with   Follow-up    3 month visit    History of Present Illness:    Julia Martinez is a 70 y.o. female with past medical history of HTN, pSVT and GERD who presents to the office today for 52-monthfollow-up.   She was examined by myself in 10/2020 and was under increased stress at the time given the declining health of her son as he was being followed by Palliative Care. Her blood pressure was elevated and was felt this was secondary to the recent stress and anxiety she was under.  She had recently been started on Effexor by her PCP and was encouraged to follow BP readings at home and report back if BP remained elevated. She was continued on Cardizem CD 240 mg daily and Toprol-XL 150 mg daily.  In talking with the patient today, she reports still being under a significant amount of stress given her son's health. She feels like this significantly impacts her her blood pressure at times but she is asymptomatic when it is elevated and does not check her BP at home. Reports she did self-discontinue Effexor in the interim as she did not wish to take something for anxiety routinely. She is trying to explore other outlets to help with her stress and recently joined a Bible study. She denies any chest pain or palpitations. No reported orthopnea, PND or pitting edema.   Past Medical History:  Diagnosis Date   Anemia    Depression    Essential hypertension    OSA (obstructive sleep apnea)    Mild by sleep study 2019   [place]    Ulcer of small intestine     Past Surgical History:  Procedure Laterality Date   BIOPSY  10/21/2019   Procedure: BIOPSY;  Surgeon: RDaneil Dolin MD;  [facility];  Service: Endoscopy;;  small bowel   CESAREAN  SECTION     COLONOSCOPY  2009   single external hemorrhoid tag, otherwise normal rectum. Numerous left-sided diverticula, abnormal IC valve and TI suggestive of Crohn's disease s/p biopsy. Biopsies with non-specific ulcerations. CTE then revealing chronic inflammatory change of distal TI, concerning for SB Crohn's. Did not start therapy due to absence of clinical symptoms [facility]   COLONOSCOPY N/A 07/21/2017   Fields: Multiple ileal diverticulum, moderate diverticulosis in the entire examined colon, small external hemorrhoids.   COLONOSCOPY N/A 10/21/2019   Procedure: COLONOSCOPY;  Surgeon: RDaneil Dolin MD;  [facility];  Service: Endoscopy;  Laterality: N/A;  2:15pm   ESOPHAGOGASTRODUODENOSCOPY N/A 07/20/2017   Fields: Normal esophagus, small hiatal hernia, normal examined duodenum.  No blood in the upper GI tract.   GIVENS CAPSULE STUDY N/A 08/30/2019   Fields: Small bowel AVMs, erosions and ulcerated distal small bowel, likely in the distal jejunum/proximal ileum, study not complete the cecum.   WISDOM TOOTH EXTRACTION      Current Medications: Outpatient Medications Prior to Visit  Medication Sig Dispense Refill   brimonidine (ALPHAGAN) 0.2 % ophthalmic solution 1 drop 2 (two) times daily.     diltiazem (CARDIZEM CD) 240 MG 24 hr capsule Take 1 capsule (240 mg total) by mouth daily. 90 capsule 3   HUMIRA PEN-CD/UC/HS STARTER 80  MG/0.8ML PNKT Inject into the skin every 14 (fourteen) days.     LATANOPROST OP Apply to eye daily.     metoprolol succinate (TOPROL-XL) 100 MG 24 hr tablet TAKE ONE TABLET (100MG TOTAL) BY MOUTH DAILY. TAKE WITH OR IMMEDIATELY FOLLOWING A MEAL. TAKING A TOTAL OF 150MG DAILY (Patient taking differently: 100 mg. Takes 100 mg daily.) 90 tablet 3   pantoprazole (PROTONIX) 20 MG tablet Take 1 tablet (20 mg total) by mouth daily. 90 tablet 3   cyanocobalamin (,VITAMIN B-12,) 1000 MCG/ML injection Inject into the muscle every 30  (thirty) days. (Patient not taking: No sig reported)     diclofenac Sodium (VOLTAREN) 1 % GEL Apply 2 g topically 4 (four) times daily. (Patient not taking: No sig reported) 150 g 1   venlafaxine XR (EFFEXOR-XR) 37.5 MG 24 hr capsule Take 37.5 mg by mouth daily. (Patient not taking: No sig reported)     No facility-administered medications prior to visit.     Allergies:   Penicillins   Social History   Socioeconomic History   Marital status: Married    Spouse name: Not on file   Number of children: 3   Years of education: Not on file   Highest education level: Not on file  Occupational History   Occupation: paraprofessional  Tobacco Use   Smoking status: Never   Smokeless tobacco: Never  Vaping Use   Vaping Use: Never used  Substance and Sexual Activity   Alcohol use: No   Drug use: No   Sexual activity: Not Currently  Other Topics Concern   Not on file  Social History Narrative   Not on file   Social Determinants of Health   Financial Resource Strain: Not on file  Food Insecurity: Not on file  Transportation Needs: Not on file  Physical Activity: Not on file  Stress: Not on file  Social Connections: Not on file     Family History:  The patient's family history includes Depression in her mother; Emphysema in her father; Prader-Willi syndrome in her son; Ulcers in her mother.   Review of Systems:    Please see the history of present illness.     All other systems reviewed and are otherwise negative except as noted above.   Physical Exam:    VS:  BP (!) 156/92   Pulse 72   Ht 5' 2"  (1.575 m)   Wt 181 lb 9.6 oz (82.4 kg)   SpO2 96%   BMI 33.22 kg/m    General: Pleasant female appearing in no acute distress. Head: Normocephalic, atraumatic. Neck: No carotid bruits. JVD not elevated.  Lungs: Respirations regular and unlabored, without wheezes or rales.  Heart: Regular rate and rhythm. No S3 or S4.  No murmur, no rubs, or gallops appreciated. Abdomen:  Appears non-distended. No obvious abdominal masses. Msk:  Strength and tone appear normal for age. No obvious joint deformities or effusions. Extremities: No clubbing or cyanosis. No pitting edema.  Distal pedal pulses are 2+ bilaterally. Neuro: Alert and oriented X 3. Moves all extremities spontaneously. No focal deficits noted. Psych:  Responds to questions appropriately with a normal affect. Skin: No rashes or lesions noted  Wt Readings from Last 3 Encounters:  02/07/21 181 lb 9.6 oz (82.4 kg)  01/08/21 182 lb 6.4 oz (82.7 kg)  10/30/20 185 lb 6.4 oz (84.1 kg)     Studies/Labs Reviewed:   EKG:  EKG is not ordered today.    Recent Labs: 01/14/2021: Hemoglobin 14.3;  Platelets 200   Lipid Panel No results found for: CHOL, TRIG, HDL, CHOLHDL, VLDL, LDLCALC, LDLDIRECT  Additional studies/ records that were reviewed today include:   Echocardiogram: 09/2017 Study Conclusions   - Left ventricle: The cavity size was normal. Wall thickness was    normal. Systolic function was normal. The estimated ejection    fraction was in the range of 60% to 65%. Wall motion was normal;    there were no regional wall motion abnormalities. Doppler    parameters are consistent with abnormal left ventricular    relaxation (grade 1 diastolic dysfunction). Doppler parameters    are consistent with high ventricular filling pressure.  - Aortic valve: Mildly calcified annulus. Trileaflet; mildly    thickened leaflets. Valve area (VTI): 1.81 cm^2. Valve area    (Vmax): 1.53 cm^2. Valve area (Vmean): 1.54 cm^2.  - Mitral valve: Mildly calcified annulus. Normal thickness leaflets    . There was mild regurgitation.  - Left atrium: The atrium was mildly dilated.  - Right ventricle: The cavity size was mildly to moderately    dilated.  - Right atrium: The atrium was mildly dilated.  - Atrial septum: No defect or patent foramen ovale was identified.  - Pulmonary arteries: Systolic pressure was moderately  increased.    PA peak pressure: 50 mm Hg (S).  - Technically adequate study.   Assessment:    1. Essential hypertension   2. Palpitations   3. Medication management   4. Gastroesophageal reflux disease, unspecified whether esophagitis present      Plan:   In order of problems listed above:  1. HTN - Her blood pressure was initially recorded at 170/100 during today's visit and rechecked and at 156/92. She has been under increased stress as outlined above and I suspect this is a major contributor to her blood pressure readings. She remains on Cardizem CD 240 mg daily along with Toprol-XL 150 mg daily but was previously intolerant to further titration of AV nodal blocking agents given bradycardia and associated fatigue. Will plan to add Losartan 25 mg daily to assist with blood pressure and plan for a repeat BMET in 2 weeks. I encouraged her to check her blood pressure at home and report back with readings via MyChart or a BP log as this may need to be titrated in the future.  2. Palpitations - She reports her palpitations have overall been well controlled since her prior visit. Continue current medication regimen with Cardizem CD 240 mg daily and Toprol-XL 150 mg daily.  3. GERD - Followed by GI.  She remains on Protonix 20 mg daily  Medication Adjustments/Labs and Tests Ordered: Current medicines are reviewed at length with the patient today.  Concerns regarding medicines are outlined above.  Medication changes, Labs and Tests ordered today are listed in the Patient Instructions below. Patient Instructions  Medication Instructions:   Start Losartan 25 mg Daily   *If you need a refill on your cardiac medications before your next appointment, please call your pharmacy*   Lab Work: Your physician recommends that you return for lab work in: BMET in 2 Weeks ( 02/22/21)   If you have labs (blood work) drawn today and your tests are completely normal, you will receive your results only  by: [place] (if you have MyChart) OR A paper copy in the mail If you have any lab test that is abnormal or we need to change your treatment, we will call you to review the results.  Testing/Procedures: NONE     Follow-Up: At [facility], you and your health needs are our priority.  As part of our continuing mission to provide you with exceptional heart care, we have created designated Provider Care Teams.  These Care Teams include your primary Cardiologist (physician) and Advanced Practice Providers (APPs -  Physician Assistants and Nurse Practitioners) who all work together to provide you with the care you need, when you need it.  We recommend signing up for the patient portal called "MyChart".  Sign up information is provided on this After Visit Summary.  MyChart is used to connect with patients for Virtual Visits (Telemedicine).  Patients are able to view lab/test results, encounter notes, upcoming appointments, etc.  Non-urgent messages can be sent to your provider as well.   To learn more about what you can do with MyChart, go to [email or web address].    Your next appointment:   6 month(s)  The format for your next appointment:   In Person  Provider:   Rozann Lesches, MD or Bernerd Pho, PA-C   Other Instructions Thank you for choosing [facility]!     Signed, Erma Heritage, PA-C  02/07/2021 4:38 PM    [facility] [address] [place], [place] [address] Phone: [phone] Fax: [phone]

## 2021-02-07 NOTE — Patient Instructions (Signed)
Medication Instructions:   Start Losartan 25 mg Daily   *If you need a refill on your cardiac medications before your next appointment, please call your pharmacy*   Lab Work: Your physician recommends that you return for lab work in: BMET in 2 Weeks ( 02/22/21)   If you have labs (blood work) drawn today and your tests are completely normal, you will receive your results only by: [place] (if you have MyChart) OR A paper copy in the mail If you have any lab test that is abnormal or we need to change your treatment, we will call you to review the results.   Testing/Procedures: NONE     Follow-Up: At [facility], you and your health needs are our priority.  As part of our continuing mission to provide you with exceptional heart care, we have created designated Provider Care Teams.  These Care Teams include your primary Cardiologist (physician) and Advanced Practice Providers (APPs -  Physician Assistants and Nurse Practitioners) who all work together to provide you with the care you need, when you need it.  We recommend signing up for the patient portal called "MyChart".  Sign up information is provided on this After Visit Summary.  MyChart is used to connect with patients for Virtual Visits (Telemedicine).  Patients are able to view lab/test results, encounter notes, upcoming appointments, etc.  Non-urgent messages can be sent to your provider as well.   To learn more about what you can do with MyChart, go to [email or web address].    Your next appointment:   6 month(s)  The format for your next appointment:   In Person  Provider:   Rozann Lesches, MD or Bernerd Pho, PA-C   Other Instructions Thank you for choosing [facility]!

## 2021-02-12 ENCOUNTER — Telehealth: Payer: Self-pay | Admitting: Internal Medicine

## 2021-02-12 NOTE — Telephone Encounter (Signed)
-----   Message from Daneil Dolin, MD sent at 02/11/2021  4:58 PM EDT ----- Lynelle Smoke, patient's daughter, Jeralyn Bennett, RN, has requested a refill for her mother's Humira.  Can we get that going?  Thanks.

## 2021-02-12 NOTE — Telephone Encounter (Signed)
Contacted [facility] [employer] and authorized a verbal refill on the pt's humira per Dr. Roseanne Kaufman request.

## 2021-02-25 DIAGNOSIS — Z23 Encounter for immunization: Secondary | ICD-10-CM | POA: Diagnosis not present

## 2021-05-24 ENCOUNTER — Other Ambulatory Visit (HOSPITAL_COMMUNITY)
Admission: RE | Admit: 2021-05-24 | Discharge: 2021-05-24 | Disposition: A | Discharge status: Home / Self Care | Payer: Medicare Other | Source: Ambulatory Visit | Attending: Internal Medicine | Admitting: Internal Medicine

## 2021-05-24 ENCOUNTER — Other Ambulatory Visit: Payer: Self-pay

## 2021-05-24 DIAGNOSIS — D509 Iron deficiency anemia, unspecified: Secondary | ICD-10-CM | POA: Insufficient documentation

## 2021-05-24 LAB — CBC WITH DIFFERENTIAL/PLATELET
Abs Immature Granulocytes: 0.04 10*3/uL (ref 0.00–0.07)
Basophils Absolute: 0.1 10*3/uL (ref 0.0–0.1)
Basophils Relative: 1 %
Eosinophils Absolute: 0.1 10*3/uL (ref 0.0–0.5)
Eosinophils Relative: 1 %
HCT: 44.8 % (ref 36.0–46.0)
Hemoglobin: 15.1 g/dL — ABNORMAL HIGH (ref 12.0–15.0)
Immature Granulocytes: 1 %
Lymphocytes Relative: 20 %
Lymphs Abs: 1.7 10*3/uL (ref 0.7–4.0)
MCH: 30.7 pg (ref 26.0–34.0)
MCHC: 33.7 g/dL (ref 30.0–36.0)
MCV: 91.1 fL (ref 80.0–100.0)
Monocytes Absolute: 0.7 10*3/uL (ref 0.1–1.0)
Monocytes Relative: 9 %
Neutro Abs: 5.6 10*3/uL (ref 1.7–7.7)
Neutrophils Relative %: 68 %
Platelets: 232 10*3/uL (ref 150–400)
RBC: 4.92 MIL/uL (ref 3.87–5.11)
RDW: 13.7 % (ref 11.5–15.5)
WBC: 8.2 10*3/uL (ref 4.0–10.5)
nRBC: 0 % (ref 0.0–0.2)

## 2021-05-24 LAB — FERRITIN: Ferritin: 49 ng/mL (ref 11–307)

## 2021-07-16 DIAGNOSIS — R1032 Left lower quadrant pain: Secondary | ICD-10-CM | POA: Diagnosis not present

## 2021-07-16 DIAGNOSIS — F331 Major depressive disorder, recurrent, moderate: Secondary | ICD-10-CM | POA: Diagnosis not present

## 2021-07-16 DIAGNOSIS — R350 Frequency of micturition: Secondary | ICD-10-CM | POA: Diagnosis not present

## 2021-07-16 DIAGNOSIS — K5090 Crohn's disease, unspecified, without complications: Secondary | ICD-10-CM | POA: Insufficient documentation

## 2021-07-22 ENCOUNTER — Encounter: Payer: Self-pay | Admitting: Internal Medicine

## 2021-08-07 ENCOUNTER — Other Ambulatory Visit: Payer: Self-pay | Admitting: Cardiology

## 2021-08-08 ENCOUNTER — Ambulatory Visit: Payer: Medicare Other | Admitting: Cardiology

## 2021-08-16 ENCOUNTER — Encounter: Payer: Self-pay | Admitting: *Deleted

## 2021-08-16 ENCOUNTER — Ambulatory Visit (INDEPENDENT_AMBULATORY_CARE_PROVIDER_SITE_OTHER): Payer: Medicare Other | Admitting: Internal Medicine

## 2021-08-16 ENCOUNTER — Other Ambulatory Visit: Payer: Self-pay

## 2021-08-16 ENCOUNTER — Encounter: Payer: Self-pay | Admitting: Internal Medicine

## 2021-08-16 VITALS — BP 180/100 | HR 67 | Temp 97.3°F | Ht 63.0 in | Wt 187.0 lb

## 2021-08-16 DIAGNOSIS — K633 Ulcer of intestine: Secondary | ICD-10-CM

## 2021-08-16 DIAGNOSIS — D509 Iron deficiency anemia, unspecified: Secondary | ICD-10-CM | POA: Diagnosis not present

## 2021-08-16 DIAGNOSIS — K5000 Crohn's disease of small intestine without complications: Secondary | ICD-10-CM | POA: Diagnosis not present

## 2021-08-16 DIAGNOSIS — Z09 Encounter for follow-up examination after completed treatment for conditions other than malignant neoplasm: Secondary | ICD-10-CM

## 2021-08-16 NOTE — Patient Instructions (Signed)
It was good to see you again today! ? ?Continue adalimumab ? ?Chem-12, CBC, CRP iron iron-binding capacity and ferritin ? ?GERD information ? ?Pantoprazole 40 mg daily-patient has prescription ? ?Bone density study if not done within 3 years ? ?Please begin your antihypertensive regimen previously prescribed by PCP ? ?Office visit in 6 months ? ?Further recommendations to follow. ?

## 2021-08-16 NOTE — Progress Notes (Signed)
? ? ?Primary Care Physician:  Pablo Lawrence, NP ?Primary Gastroenterologist:  Dr. Gala Romney ? ?Pre-Procedure History & Physical: ?HPI:  Julia Martinez is a 71 y.o. female here for follow-up of small bowel Crohn's disease/IDA.  Doing very well from a GI standpoint no abdominal pain; 1 bowel movement daily  - formed,  no blood.  No nausea or vomiting; appetite maintained.  No joint, eye or skin problems.  Does have frequent heartburn worse at night.  Prescribed pantoprazole by PCP; wanted to talk to me before initiating.  No dysphagia. ?BP up today not taking her medication.  Stressed out managing her son at home with Prader-Willi syndrome. ?Essentially negative colonoscopy 2021.  Prolonged capsule transit previously.  Eventually passed by x-ray.  Inactive Crohn's disease. ?Patient may be due for bone density at this time.  She is getting updated age-specific vaccines through her PCP.  She is on B12 supplementation. ? ?Past Medical History:  ?Diagnosis Date  ? Anemia   ? Depression   ? Essential hypertension   ? OSA (obstructive sleep apnea)   ? Mild by sleep study 2019  ? PSVT (paroxysmal supraventricular tachycardia) ([place])   ? Ulcer of small intestine   ? ? ?Past Surgical History:  ?Procedure Laterality Date  ? BIOPSY  10/21/2019  ? Procedure: BIOPSY;  Surgeon: Daneil Dolin, MD;  [facility];  Service: Endoscopy;;  small bowel  ? CESAREAN SECTION    ? COLONOSCOPY  2009  ? single external hemorrhoid tag, otherwise normal rectum. Numerous left-sided diverticula, abnormal IC valve and TI suggestive of Crohn's disease s/p biopsy. Biopsies with non-specific ulcerations. CTE then revealing chronic inflammatory change of distal TI, concerning for SB Crohn's. Did not start therapy due to absence of clinical symptoms and recommended [facility] evaluation.  ? COLONOSCOPY N/A 07/21/2017  ? Fields: Multiple ileal diverticulum, moderate diverticulosis in the entire examined colon, small external hemorrhoids.  ?  COLONOSCOPY N/A 10/21/2019  ? Procedure: COLONOSCOPY;  Surgeon: Daneil Dolin, MD;  [facility]: [facility];  Service: Endoscopy;  Laterality: N/A;  2:15pm  ? ESOPHAGOGASTRODUODENOSCOPY N/A 07/20/2017  ? Fields: Normal esophagus, small hiatal hernia, normal examined duodenum.  No blood in the upper GI tract.  ? GIVENS CAPSULE STUDY N/A 08/30/2019  ? Fields: Small bowel AVMs, erosions and ulcerated distal small bowel, likely in the distal jejunum/proximal ileum, study not complete the cecum.  ? WISDOM TOOTH EXTRACTION    ? ? ?Prior to Admission medications   ?Medication Sig Start Date End Date Taking? Authorizing Provider  ?brimonidine (ALPHAGAN) 0.2 % ophthalmic solution 1 drop 2 (two) times daily. 01/02/20  Yes [provider]  ?diltiazem (CARDIZEM CD) 240 MG 24 hr capsule Take 1 capsule (240 mg total) by mouth daily. 09/26/20  Yes Strader, Fransisco Hertz, PA-C  ?HUMIRA PEN-CD/UC/HS STARTER 80 MG/0.8ML PNKT Inject into the skin every 14 (fourteen) days. 12/02/19  Yes [provider]  ?LATANOPROST OP Apply to eye daily.   Yes [provider]  ?metoprolol succinate (TOPROL-XL) 100 MG 24 hr tablet TAKE ONE TABLET (100MG TOTAL) BY MOUTH DAILY. TAKE WITH OR IMMEDIATELY FOLLOWING A MEAL. TAKING A TOTAL OF 150MG DAILY 08/07/21  Yes Satira Sark, MD  ?venlafaxine XR (EFFEXOR-XR) 37.5 MG 24 hr capsule Take 37.5 mg by mouth daily. 10/01/20  Yes [provider]  ?cyanocobalamin (,VITAMIN B-12,) 1000 MCG/ML injection Inject into the muscle every 30 (thirty) days. ?Patient not taking: Reported on 01/08/2021 01/04/20   [provider]  ?  diclofenac Sodium (VOLTAREN) 1 % GEL Apply 2 g topically 4 (four) times daily. ?Patient not taking: Reported on 01/08/2021 09/11/20   Mordecai Rasmussen, MD  ?losartan (COZAAR) 25 MG tablet Take 1 tablet (25 mg total) by mouth daily. ?Patient not taking: Reported on 08/16/2021 02/07/21 08/16/21  Erma Heritage, PA-C  ?pantoprazole (PROTONIX) 20 MG tablet Take 1  tablet (20 mg total) by mouth daily. ?Patient not taking: Reported on 08/16/2021 10/30/20   Erma Heritage, PA-C  ? ? ?Allergies as of 08/16/2021 - Review Complete 08/16/2021  ?Allergen Reaction Noted  ? Penicillins Nausea Only 10/06/2013  ? ? ?Family History  ?Problem Relation Age of Onset  ? Depression Mother   ? Ulcers Mother   ? Emphysema Father   ? Prader-Willi syndrome Son   ? Colon cancer Neg Hx   ? Colon polyps Neg Hx   ? ? ?Social History  ? ?Socioeconomic History  ? Marital status: Married  ?  Spouse name: Not on file  ? Number of children: 3  ? Years of education: Not on file  ? Highest education level: Not on file  ?Occupational History  ? Occupation: paraprofessional  ?Tobacco Use  ? Smoking status: Never  ? Smokeless tobacco: Never  ?Vaping Use  ? Vaping Use: Never used  ?Substance and Sexual Activity  ? Alcohol use: No  ? Drug use: No  ? Sexual activity: Not Currently  ?Other Topics Concern  ? Not on file  ?Social History Narrative  ? Not on file  ? ?Social Determinants of Health  ? ?Financial Resource Strain: Not on file  ?Food Insecurity: Not on file  ?Transportation Needs: Not on file  ?Physical Activity: Not on file  ?Stress: Not on file  ?Social Connections: Not on file  ?Intimate Partner Violence: Not on file  ? ? ?Review of Systems: ?See HPI, otherwise negative ROS ? ?Physical Exam: ?BP (!) 180/100 (BP Location: Right Arm, Patient Position: Sitting, Cuff Size: Large)   Pulse 67   Temp (!) 97.3 ?F (36.3 ?C) (Temporal)   Ht 5' 3"  (1.6 m)   Wt 187 lb (84.8 kg)   SpO2 97%   BMI 33.13 kg/m?  ?General:   Alert,  Well-developed, well-nourished, pleasant and cooperative in NAD ?Mouth:  No deformity or lesions. ?Neck:  Supple; no masses or thyromegaly. No significant cervical adenopathy. ?Lungs:  Clear throughout to auscultation.   No wheezes, crackles, or rhonchi. No acute distress. ?Heart:  Regular rate and rhythm; no murmurs, clicks, rubs,  or gallops. ?Abdomen: Non-distended, normal bowel  sounds.  Soft and nontender without appreciable mass or hepatosplenomegaly.  ?Pulses:  Normal pulses noted. ?Extremities:  Without clubbing or edema. ? ?Impression/Plan: Small bowel Crohn's disease in clinical remission and likely deeper endoscopic remission.  Doing well on adalimumab. ? ?Needs age specific vaccines.  May be due for a bone density. ? ?She has symptomatic GERD without alarm features.   ? ?Blood pressure is up; noncompliant with her medication regimen. ? ?Recommendations: ? ?Continue adalimumab ? ?Chem-12, CBC, CRP iron iron-binding capacity and ferritin ? ?GERD information ? ?Pantoprazole 40 mg daily-patient has prescription ? ?Bone density study if not done within 3 years ? ?Pt encouraged to take anti-hypertensive regimen previously prescribed by PCP - EVERY DAY ? ?Office visit in 6 months ? ?Further recommendations to follow. ? ? ? ? ? ?Notice: This dictation was prepared with Dragon dictation along with smaller phrase technology. Any transcriptional errors that result from this process are  unintentional and may not be corrected upon review.  ?

## 2021-08-19 ENCOUNTER — Other Ambulatory Visit (HOSPITAL_COMMUNITY)
Admission: RE | Admit: 2021-08-19 | Discharge: 2021-08-19 | Disposition: A | Discharge status: Home / Self Care | Payer: Medicare Other | Source: Ambulatory Visit | Attending: Internal Medicine | Admitting: Internal Medicine

## 2021-08-19 DIAGNOSIS — D509 Iron deficiency anemia, unspecified: Secondary | ICD-10-CM | POA: Diagnosis not present

## 2021-08-19 DIAGNOSIS — K633 Ulcer of intestine: Secondary | ICD-10-CM | POA: Diagnosis not present

## 2021-08-19 LAB — COMPREHENSIVE METABOLIC PANEL
ALT: 18 U/L (ref 0–44)
AST: 14 U/L — ABNORMAL LOW (ref 15–41)
Albumin: 3.8 g/dL (ref 3.5–5.0)
Alkaline Phosphatase: 125 U/L (ref 38–126)
Anion gap: 6 (ref 5–15)
BUN: 14 mg/dL (ref 8–23)
CO2: 26 mmol/L (ref 22–32)
Calcium: 10.1 mg/dL (ref 8.9–10.3)
Chloride: 108 mmol/L (ref 98–111)
Creatinine, Ser: 0.79 mg/dL (ref 0.44–1.00)
GFR, Estimated: >60 mL/min (ref >60)
Glucose, Bld: 121 mg/dL — ABNORMAL HIGH (ref 70–99)
Potassium: 4.3 mmol/L (ref 3.5–5.1)
Sodium: 140 mmol/L (ref 135–145)
Total Bilirubin: 0.6 mg/dL (ref 0.3–1.2)
Total Protein: 7.5 g/dL (ref 6.5–8.1)

## 2021-08-19 LAB — CBC WITH DIFFERENTIAL/PLATELET
Abs Immature Granulocytes: 0.05 10*3/uL (ref 0.00–0.07)
Basophils Absolute: 0 10*3/uL (ref 0.0–0.1)
Basophils Relative: 1 %
Eosinophils Absolute: 0.1 10*3/uL (ref 0.0–0.5)
Eosinophils Relative: 2 %
HCT: 44.5 % (ref 36.0–46.0)
Hemoglobin: 14.5 g/dL (ref 12.0–15.0)
Immature Granulocytes: 1 %
Lymphocytes Relative: 23 %
Lymphs Abs: 1.9 10*3/uL (ref 0.7–4.0)
MCH: 29.2 pg (ref 26.0–34.0)
MCHC: 32.6 g/dL (ref 30.0–36.0)
MCV: 89.5 fL (ref 80.0–100.0)
Monocytes Absolute: 0.7 10*3/uL (ref 0.1–1.0)
Monocytes Relative: 8 %
Neutro Abs: 5.7 10*3/uL (ref 1.7–7.7)
Neutrophils Relative %: 65 %
Platelets: 195 10*3/uL (ref 150–400)
RBC: 4.97 MIL/uL (ref 3.87–5.11)
RDW: 13.6 % (ref 11.5–15.5)
WBC: 8.5 10*3/uL (ref 4.0–10.5)
nRBC: 0 % (ref 0.0–0.2)

## 2021-08-19 LAB — IRON AND TIBC
Iron: 62 ug/dL (ref 28–170)
Saturation Ratios: 19 % (ref 10.4–31.8)
TIBC: 323 ug/dL (ref 250–450)
UIBC: 261 ug/dL

## 2021-08-19 LAB — FERRITIN: Ferritin: 69 ng/mL (ref 11–307)

## 2021-08-19 LAB — C-REACTIVE PROTEIN: CRP: 0.6 mg/dL (ref <1.0)

## 2021-08-26 ENCOUNTER — Other Ambulatory Visit (HOSPITAL_COMMUNITY): Payer: Medicare Other

## 2021-08-29 ENCOUNTER — Ambulatory Visit (HOSPITAL_COMMUNITY)
Admission: RE | Admit: 2021-08-29 | Discharge: 2021-08-29 | Disposition: A | Discharge status: Home / Self Care | Payer: Medicare Other | Source: Ambulatory Visit | Attending: Internal Medicine | Admitting: Internal Medicine

## 2021-08-29 ENCOUNTER — Other Ambulatory Visit (HOSPITAL_COMMUNITY): Payer: Self-pay | Admitting: Adult Health Nurse Practitioner

## 2021-08-29 DIAGNOSIS — Z09 Encounter for follow-up examination after completed treatment for conditions other than malignant neoplasm: Secondary | ICD-10-CM | POA: Insufficient documentation

## 2021-08-29 DIAGNOSIS — Z1231 Encounter for screening mammogram for malignant neoplasm of breast: Secondary | ICD-10-CM

## 2021-08-29 DIAGNOSIS — K5000 Crohn's disease of small intestine without complications: Secondary | ICD-10-CM | POA: Insufficient documentation

## 2021-08-29 DIAGNOSIS — Z13820 Encounter for screening for osteoporosis: Secondary | ICD-10-CM | POA: Diagnosis not present

## 2021-08-29 DIAGNOSIS — Z780 Asymptomatic menopausal state: Secondary | ICD-10-CM | POA: Diagnosis not present

## 2021-08-29 DIAGNOSIS — M810 Age-related osteoporosis without current pathological fracture: Secondary | ICD-10-CM | POA: Diagnosis not present

## 2021-09-01 NOTE — Progress Notes (Signed)
? ? ?Cardiology Office Note ? ?Date: 09/03/2021  ? ?ID: Julia Martinez, DOB April 22, 1951, MRN [id number] ? ?PCP:  Pablo Lawrence, NP  ?Cardiologist:  Rozann Lesches, MD ?Electrophysiologist:  None  ? ?Chief Complaint  ?Patient presents with  ? Cardiac follow-up  ? ? ?History of Present Illness: ?Julia Martinez is a 71 y.o. female last seen in September 2022 by Ms. Strader PA-C.  She is here for a routine visit.  States that she is under a lot of family stress.  No chest pain or progressive palpitations however.  She has been taking her medications regularly, recently started on Cozaar 25 mg daily for additional blood pressure control.  She has not been checking her blood pressure regularly with home cuff. ? ?I reviewed her ECG today which shows sinus rhythm with right bundle branch block.  She also just recently had lab work which is outlined below.  Renal function and potassium normal. ? ?Her last echocardiogram was in May 2019 at which point she had evidence of aortic valve sclerosis, not necessarily stenosis.  PASP was also elevated.  We discussed getting an updated study. ? ?Past Medical History:  ?Diagnosis Date  ? Anemia   ? Depression   ? Essential hypertension   ? OSA (obstructive sleep apnea)   ? Mild by sleep study 2019  ? PSVT (paroxysmal supraventricular tachycardia) ([place])   ? Ulcer of small intestine   ? ? ?Past Surgical History:  ?Procedure Laterality Date  ? BIOPSY  10/21/2019  ? Procedure: BIOPSY;  Surgeon: Daneil Dolin, MD;  [facility];  Service: Endoscopy;;  small bowel  ? CESAREAN SECTION    ? COLONOSCOPY  2009  ? single external hemorrhoid tag, otherwise normal rectum. Numerous left-sided diverticula, abnormal IC valve and TI suggestive of Crohn's disease s/p biopsy. Biopsies with non-specific ulcerations. CTE then revealing chronic inflammatory change of distal TI, concerning for SB Crohn's. Did not start therapy due to absence of clinical symptoms and recommended [facility] evaluation.   ? COLONOSCOPY N/A 07/21/2017  ? Fields: Multiple ileal diverticulum, moderate diverticulosis in the entire examined colon, small external hemorrhoids.  ? COLONOSCOPY N/A 10/21/2019  ? Procedure: COLONOSCOPY;  Surgeon: Daneil Dolin, MD;  [facility];  Service: Endoscopy;  Laterality: N/A;  2:15pm  ? ESOPHAGOGASTRODUODENOSCOPY N/A 07/20/2017  ? Fields: Normal esophagus, small hiatal hernia, normal examined duodenum.  No blood in the upper GI tract.  ? GIVENS CAPSULE STUDY N/A 08/30/2019  ? Fields: Small bowel AVMs, erosions and ulcerated distal small bowel, likely in the distal jejunum/proximal ileum, study not complete the cecum.  ? WISDOM TOOTH EXTRACTION    ? ? ?Current Outpatient Medications  ?Medication Sig Dispense Refill  ? brimonidine (ALPHAGAN) 0.2 % ophthalmic solution 1 drop 2 (two) times daily.    ? diltiazem (CARDIZEM CD) 240 MG 24 hr capsule Take 1 capsule (240 mg total) by mouth daily. 90 capsule 3  ? HUMIRA PEN-CD/UC/HS STARTER 80 MG/0.8ML PNKT Inject into the skin every 14 (fourteen) days.    ? LATANOPROST OP Apply to eye daily.    ? losartan (COZAAR) 50 MG tablet Take 1 tablet (50 mg total) by mouth daily. 90 tablet 1  ? metoprolol succinate (TOPROL-XL) 100 MG 24 hr tablet TAKE ONE TABLET (100MG TOTAL) BY MOUTH DAILY. TAKE WITH OR IMMEDIATELY FOLLOWING A MEAL. TAKING A TOTAL OF 150MG DAILY 90 tablet 3  ? pantoprazole (PROTONIX) 20 MG tablet Take 1 tablet (20 mg total) by  mouth daily. 90 tablet 3  ? ?No current facility-administered medications for this visit.  ? ?Allergies:  Penicillins  ? ?ROS:  No syncope. ? ?Physical Exam: ?VS:  BP (!) 182/92   Pulse 64   Ht 5' 3"  (1.6 m)   Wt 189 lb 12.8 oz (86.1 kg)   SpO2 94%   BMI 33.62 kg/m? , BMI Body mass index is 33.62 kg/m?. ? ?Wt Readings from Last 3 Encounters:  ?09/03/21 189 lb 12.8 oz (86.1 kg)  ?08/16/21 187 lb (84.8 kg)  ?02/07/21 181 lb 9.6 oz (82.4 kg)  ?  ?General: Patient appears comfortable at rest. ?HEENT: Conjunctiva and lids  normal, oropharynx clear.Marland Kitchen ?Neck: Supple, no elevated JVP or carotid bruits, no thyromegaly. ?Lungs: Clear to auscultation, nonlabored breathing at rest. ?Cardiac: Regular rate and rhythm, no S3, 8-2/4 basal systolic murmur, no pericardial rub. ?Extremities: No pitting edema. ? ?ECG:  An ECG dated 08/10/2020 was personally reviewed today and demonstrated:  Sinus rhythm with right bundle branch block and nonspecific ST-T wave abnormalities. ? ?Recent Labwork: ?08/19/2021: ALT 18; AST 14; BUN 14; Creatinine, Ser 0.79; Hemoglobin 14.5; Platelets 195; Potassium 4.3; Sodium 140  ? ?Other Studies Reviewed Today: ? ?Echocardiogram 10/01/2017: ?- Left ventricle: The cavity size was normal. Wall thickness was  ?  normal. Systolic function was normal. The estimated ejection  ?  fraction was in the range of 60% to 65%. Wall motion was normal;  ?  there were no regional wall motion abnormalities. Doppler  ?  parameters are consistent with abnormal left ventricular  ?  relaxation (grade 1 diastolic dysfunction). Doppler parameters  ?  are consistent with high ventricular filling pressure.  ?- Aortic valve: Mildly calcified annulus. Trileaflet; mildly  ?  thickened leaflets. Valve area (VTI): 1.81 cm^2. Valve area  ?  (Vmax): 1.53 cm^2. Valve area (Vmean): 1.54 cm^2.  ?- Mitral valve: Mildly calcified annulus. Normal thickness leaflets  ?  Marland Kitchen There was mild regurgitation.  ?- Left atrium: The atrium was mildly dilated.  ?- Right ventricle: The cavity size was mildly to moderately  ?  dilated.  ?- Right atrium: The atrium was mildly dilated.  ?- Atrial septum: No defect or patent foramen ovale was identified.  ?- Pulmonary arteries: Systolic pressure was moderately increased.  ?  PA peak pressure: 50 mm Hg (S).  ?- Technically adequate study.  ? ?Assessment and Plan: ? ?1.  PSVT with history of recurrent palpitations, currently well controlled on combination of Cardizem CD and Toprol-XL.  ECG reviewed and stable.  No changes were made  today. ? ?2.  Essential hypertension, blood pressure not well controlled.  Increasing Cozaar to 50 mg daily.  Encouraged her to check blood pressure periodically with home cuff. ? ?3.  Heart murmur and previously sclerotic aortic valve as of 2019.  An updated echocardiogram will be obtained for surveillance. ? ?Medication Adjustments/Labs and Tests Ordered: ?Current medicines are reviewed at length with the patient today.  Concerns regarding medicines are outlined above.  ? ?Tests Ordered: ?Orders Placed This Encounter  ?Procedures  ? EKG 12-Lead  ? ECHOCARDIOGRAM COMPLETE  ? ? ?Medication Changes: ?Meds ordered this encounter  ?Medications  ? losartan (COZAAR) 50 MG tablet  ?  Sig: Take 1 tablet (50 mg total) by mouth daily.  ?  Dispense:  90 tablet  ?  Refill:  1  ?  09/03/2021 dose increase  ? ? ?Disposition:  Follow up  6 months. ? ?Signed, ?Satira Sark, MD, [facility] ?  09/03/2021 8:49 AM    ?[place] [facility] ?[address], [place], [place] [address] ?Phone: [phone]; Fax: [phone]  ?

## 2021-09-03 ENCOUNTER — Ambulatory Visit (INDEPENDENT_AMBULATORY_CARE_PROVIDER_SITE_OTHER): Payer: Medicare Other | Admitting: Cardiology

## 2021-09-03 ENCOUNTER — Encounter: Payer: Self-pay | Admitting: Cardiology

## 2021-09-03 VITALS — BP 182/92 | HR 64 | Ht 63.0 in | Wt 189.8 lb

## 2021-09-03 DIAGNOSIS — I10 Essential (primary) hypertension: Secondary | ICD-10-CM

## 2021-09-03 DIAGNOSIS — R011 Cardiac murmur, unspecified: Secondary | ICD-10-CM | POA: Diagnosis not present

## 2021-09-03 DIAGNOSIS — I471 Supraventricular tachycardia: Secondary | ICD-10-CM | POA: Diagnosis not present

## 2021-09-03 MED ORDER — LOSARTAN POTASSIUM 50 MG PO TABS: 50.0000 mg | ORAL_TABLET | Freq: Every day | ORAL | 1 refills | Status: DC

## 2021-09-03 NOTE — Patient Instructions (Addendum)
Medication Instructions:  ?Your physician has recommended you make the following change in your medication:  ?Increase losartan to 50 mg daily ?Continue other medications the same ? ?Labwork: ?none ? ?Testing/Procedures: ?Your physician has requested that you have an echocardiogram. Echocardiography is a painless test that uses sound waves to create images of your heart. It provides your doctor with information about the size and shape of your heart and how well your heart?s chambers and valves are working. This procedure takes approximately one hour. There are no restrictions for this procedure. ? ?Follow-Up: ?Your physician recommends that you schedule a follow-up appointment in: 6 months ? ?Any Other Special Instructions Will Be Listed Below (If Applicable). ? ?If you need a refill on your cardiac medications before your next appointment, please call your pharmacy. ?

## 2021-09-09 ENCOUNTER — Inpatient Hospital Stay (HOSPITAL_COMMUNITY): Admission: RE | Admit: 2021-09-09 | Payer: Medicare Other | Source: Ambulatory Visit

## 2021-09-12 ENCOUNTER — Ambulatory Visit (HOSPITAL_COMMUNITY)
Admission: RE | Admit: 2021-09-12 | Discharge: 2021-09-12 | Disposition: A | Discharge status: Home / Self Care | Payer: Medicare Other | Source: Ambulatory Visit | Attending: Cardiology | Admitting: Cardiology

## 2021-09-12 DIAGNOSIS — R011 Cardiac murmur, unspecified: Secondary | ICD-10-CM | POA: Insufficient documentation

## 2021-09-12 LAB — ECHOCARDIOGRAM COMPLETE
AR max vel: 2.19 cm2
AV Area VTI: 2.21 cm2
AV Area mean vel: 1.95 cm2
AV Mean grad: 3 mmHg
AV Peak grad: 5.3 mmHg
Ao pk vel: 1.15 m/s
Area-P 1/2: 4.33 cm2
MV VTI: 2.65 cm2
S' Lateral: 2.7 cm

## 2021-09-12 NOTE — Progress Notes (Signed)
*  PRELIMINARY RESULTS* ?Echocardiogram ?2D Echocardiogram has been performed. ? ?Julia Martinez ?09/12/2021, 4:20 PM ?

## 2021-09-14 ENCOUNTER — Encounter: Payer: Self-pay | Admitting: Cardiology

## 2021-09-27 ENCOUNTER — Other Ambulatory Visit: Payer: Self-pay | Admitting: Student

## 2021-11-04 ENCOUNTER — Encounter: Payer: Self-pay | Admitting: Cardiology

## 2021-11-21 ENCOUNTER — Encounter: Payer: Self-pay | Admitting: Internal Medicine

## 2022-01-18 ENCOUNTER — Encounter (HOSPITAL_COMMUNITY): Payer: Self-pay

## 2022-01-18 ENCOUNTER — Emergency Department (HOSPITAL_COMMUNITY)
Admission: EM | Admit: 2022-01-18 | Discharge: 2022-01-18 | Disposition: A | Discharge status: Home / Self Care | Payer: Medicare Other | Attending: Emergency Medicine | Admitting: Emergency Medicine

## 2022-01-18 ENCOUNTER — Other Ambulatory Visit: Payer: Self-pay

## 2022-01-18 DIAGNOSIS — Z794 Long term (current) use of insulin: Secondary | ICD-10-CM | POA: Insufficient documentation

## 2022-01-18 DIAGNOSIS — L03211 Cellulitis of face: Secondary | ICD-10-CM | POA: Diagnosis not present

## 2022-01-18 DIAGNOSIS — R220 Localized swelling, mass and lump, head: Secondary | ICD-10-CM | POA: Diagnosis present

## 2022-01-18 MED ORDER — DOXYCYCLINE HYCLATE 100 MG PO CAPS: 100.0000 mg | ORAL_CAPSULE | Freq: Two times a day (BID) | ORAL | 0 refills | Status: DC

## 2022-01-18 NOTE — ED Triage Notes (Signed)
Pt to er, pt states that she is here for some swelling to her face, pt states that she took two doxycycline and put a warm compress on it and it got a little better today, states that her son has mrsa and wants to know if this has something to do with the swelling, no resps distress, no throat swelling.

## 2022-01-18 NOTE — ED Notes (Signed)
ED Provider at bedside. 

## 2022-01-18 NOTE — Discharge Instructions (Signed)
Your heart rate has been too fast tonight, I know that you tell me that you have had tachycardia for many years and that you have atrial fibrillation but I want to make sure that you are taking your medication so please take the metoprolol and your diltiazem as soon as you get home this evening.  You have a skin infection which is called cellulitis, this is probably caused from a staph infection, I want you to take the medicine called doxycycline 1 tablet twice a day for the next 10 days.  You have already had today's medicine so your next dose should be tomorrow morning.  Thank you for allowing [place] to treat you in the emergency department today.  After reviewing your examination and potential testing that was done it appears that you are safe to go home.  I would like for you to follow-up with your doctor within the next several days, have them obtain your results and follow-up with them to review all of these tests.  If you should develop severe or worsening symptoms return to the emergency department immediately

## 2022-01-18 NOTE — ED Provider Notes (Signed)
Julia Martinez Provider Note   CSN: [id number] Arrival date & time: 01/18/22  2006     History  Chief Complaint  Patient presents with   Facial Swelling    Julia Martinez is a 71 y.o. female.  HPI   This patient is a 71 year old female, she has a history of being on Humira, she is on metoprolol and diltiazem for her chronic tachycardia and atrial fibrillation, she reports that she has had some swelling in her chin for couple of days, she started doxycycline today that she had leftover, she states it already feels better but she was worried that her lip was swelling and it might close her throat off.  She does not have any throat pain at this time nor does she have any difficulty breathing.  She has no fevers or chills  Home Medications Prior to Admission medications   Medication Sig Start Date End Date Taking? Authorizing Provider  doxycycline (VIBRAMYCIN) 100 MG capsule Take 1 capsule (100 mg total) by mouth 2 (two) times daily. 01/18/22  Yes Noemi Chapel, MD  brimonidine (ALPHAGAN) 0.2 % ophthalmic solution 1 drop 2 (two) times daily. 01/02/20   [provider]  diltiazem (CARDIZEM CD) 240 MG 24 hr capsule TAKE ONE CAPSULE (240 MG TOTAL) BY MOUTHDAILY. 09/27/21   Strader, Fransisco Hertz, PA-C  HUMIRA PEN-CD/UC/HS STARTER 80 MG/0.8ML PNKT Inject into the skin every 14 (fourteen) days. 12/02/19   [provider]  LATANOPROST OP Apply to eye daily.    [provider]  losartan (COZAAR) 50 MG tablet Take 1 tablet (50 mg total) by mouth daily. 09/03/21   Satira Sark, MD  metoprolol succinate (TOPROL-XL) 100 MG 24 hr tablet TAKE ONE TABLET (100MG TOTAL) BY MOUTH DAILY. TAKE WITH OR IMMEDIATELY FOLLOWING A MEAL. TAKING A TOTAL OF 150MG DAILY 08/07/21   Satira Sark, MD  pantoprazole (PROTONIX) 20 MG tablet Take 1 tablet (20 mg total) by mouth daily. 10/30/20   Erma Heritage, PA-C      Allergies    Penicillins    Review of Systems    Review of Systems  All other systems reviewed and are negative.   Physical Exam Updated Vital Signs BP 133/84 (BP Location: Right Arm)   Pulse 97   Ht 1.6 m (5' 3" )   Wt 81.6 kg   SpO2 (!) 85%   BMI 31.89 kg/m  Physical Exam Vitals and nursing note reviewed.  Constitutional:      General: She is not in acute distress.    Appearance: She is well-developed.  HENT:     Head: Normocephalic and atraumatic.     Comments: Palpation of the chin and the lower lip with mild redness and tenderness, there is no gingival abscesses, the posterior pharynx is totally clear, there is no trismus or torticollis    Mouth/Throat:     Pharynx: No oropharyngeal exudate.  Eyes:     General: No scleral icterus.       Right eye: No discharge.        Left eye: No discharge.     Conjunctiva/sclera: Conjunctivae normal.     Pupils: Pupils are equal, round, and reactive to light.  Neck:     Thyroid: No thyromegaly.     Vascular: No JVD.  Cardiovascular:     Rate and Rhythm: Normal rate and regular rhythm.     Heart sounds: Normal heart sounds. No murmur heard.    No friction rub. No  gallop.  Pulmonary:     Effort: Pulmonary effort is normal. No respiratory distress.     Breath sounds: Normal breath sounds. No wheezing or rales.  Abdominal:     General: Bowel sounds are normal. There is no distension.     Palpations: Abdomen is soft. There is no mass.     Tenderness: There is no abdominal tenderness.  Musculoskeletal:        General: No tenderness. Normal range of motion.     Cervical back: Normal range of motion and neck supple.     Right lower leg: No edema.     Left lower leg: No edema.  Lymphadenopathy:     Cervical: No cervical adenopathy.  Skin:    General: Skin is warm and dry.     Findings: No erythema or rash.  Neurological:     Mental Status: She is alert.     Coordination: Coordination normal.  Psychiatric:        Behavior: Behavior normal.     ED Results / Procedures /  Treatments   Labs (all labs ordered are listed, but only abnormal results are displayed) Labs Reviewed - No data to display  EKG None  Radiology No results found.  Procedures Procedures    Medications Ordered in ED Medications - No data to display  ED Course/ Medical Decision Making/ A&P                           Medical Decision Making Risk Prescription drug management.   No lymphadenopathy of the neck No trismus or torticollis Obvious chin cellulitis without drainable abscess Patient is tachycardic to 120, states this is chronic, has not had her nighttime rate control meds, this is less concerning in this situation The patient reports to me that she has chronic tachycardia and that she is not concerned about her heart rate at that rate.  The patient also was noted to have an oxygen of 85% on arrival but on my exam she is 97% on the monitor.  She has no shortness of breath and clear lungs  She will be given a refill on doxycycline, she has already had today's doses and is already feeling better, her concerns about her throat closing off have been put to rest that she has absolutely no symptoms and there is no signs of posterior pharyngeal involvement.  Patient stable for discharge        Final Clinical Impression(s) / ED Diagnoses Final diagnoses:  Cellulitis of chin    Rx / DC Orders ED Discharge Orders          Ordered    doxycycline (VIBRAMYCIN) 100 MG capsule  2 times daily        01/18/22 2039              Noemi Chapel, MD 01/18/22 2042

## 2022-01-29 DIAGNOSIS — D508 Other iron deficiency anemias: Secondary | ICD-10-CM | POA: Diagnosis not present

## 2022-01-29 DIAGNOSIS — I471 Supraventricular tachycardia: Secondary | ICD-10-CM | POA: Diagnosis not present

## 2022-01-29 DIAGNOSIS — Z79899 Other long term (current) drug therapy: Secondary | ICD-10-CM | POA: Diagnosis not present

## 2022-01-29 DIAGNOSIS — D84821 Immunodeficiency due to drugs: Secondary | ICD-10-CM | POA: Diagnosis not present

## 2022-01-29 DIAGNOSIS — E782 Mixed hyperlipidemia: Secondary | ICD-10-CM | POA: Diagnosis not present

## 2022-01-29 DIAGNOSIS — M816 Localized osteoporosis [Lequesne]: Secondary | ICD-10-CM | POA: Diagnosis not present

## 2022-01-29 DIAGNOSIS — E559 Vitamin D deficiency, unspecified: Secondary | ICD-10-CM | POA: Diagnosis not present

## 2022-01-29 DIAGNOSIS — I10 Essential (primary) hypertension: Secondary | ICD-10-CM | POA: Diagnosis not present

## 2022-01-29 DIAGNOSIS — Z0000 Encounter for general adult medical examination without abnormal findings: Secondary | ICD-10-CM | POA: Diagnosis not present

## 2022-01-29 DIAGNOSIS — Z23 Encounter for immunization: Secondary | ICD-10-CM | POA: Diagnosis not present

## 2022-03-26 NOTE — Progress Notes (Unsigned)
Cardiology Office Note  Date: 03/27/2022   ID: Julia Martinez, DOB 1950-05-24, MRN [id number]  PCP:  Pablo Lawrence, NP  Cardiologist:  Rozann Lesches, MD Electrophysiologist:  None   Chief Complaint  Patient presents with   Cardiac follow-up    History of Present Illness: Julia Martinez is a 71 y.o. female last seen in April.  She is here for a routine visit.  Reports no progressive sense of palpitations, no increasing shortness of breath or syncope.  I reviewed her medications which are stable from a cardiac perspective.  Blood pressure was elevated again today.  She has not been checking it regularly at home, I asked her to keep an eye on this and report back as related to further uptitrate her losartan.  Follow-up echocardiogram in April revealed LVEF 60 to 65% with mild diastolic dysfunction, RVSP estimated 48 mmHg, small pericardial effusion, mild mitral regurgitation, and sclerotic aortic valve without stenosis.  Past Medical History:  Diagnosis Date   Anemia    Depression    Essential hypertension    OSA (obstructive sleep apnea)    Mild by sleep study 2019   PSVT (paroxysmal supraventricular tachycardia)    Ulcer of small intestine     Past Surgical History:  Procedure Laterality Date   BIOPSY  10/21/2019   Procedure: BIOPSY;  Surgeon: Daneil Dolin, MD;  [facility]: [facility];  Service: Endoscopy;;  small bowel   CESAREAN SECTION     COLONOSCOPY  2009   single external hemorrhoid tag, otherwise normal rectum. Numerous left-sided diverticula, abnormal IC valve and TI suggestive of Crohn's disease s/p biopsy. Biopsies with non-specific ulcerations. CTE then revealing chronic inflammatory change of distal TI, concerning for SB Crohn's. Did not start therapy due to absence of clinical symptoms and recommended [facility] evaluation.   COLONOSCOPY N/A 07/21/2017   Fields: Multiple ileal diverticulum, moderate diverticulosis in the entire examined colon, small external  hemorrhoids.   COLONOSCOPY N/A 10/21/2019   Procedure: COLONOSCOPY;  Surgeon: Daneil Dolin, MD;  Location: [facility];  Service: Endoscopy;  Laterality: N/A;  2:15pm   ESOPHAGOGASTRODUODENOSCOPY N/A 07/20/2017   Fields: Normal esophagus, small hiatal hernia, normal examined duodenum.  No blood in the upper GI tract.   GIVENS CAPSULE STUDY N/A 08/30/2019   Fields: Small bowel AVMs, erosions and ulcerated distal small bowel, likely in the distal jejunum/proximal ileum, study not complete the cecum.   WISDOM TOOTH EXTRACTION      Current Outpatient Medications  Medication Sig Dispense Refill   brimonidine (ALPHAGAN) 0.2 % ophthalmic solution 1 drop 2 (two) times daily.     doxycycline (VIBRAMYCIN) 100 MG capsule Take 1 capsule (100 mg total) by mouth 2 (two) times daily. 20 capsule 0   HUMIRA PEN-CD/UC/HS STARTER 80 MG/0.8ML PNKT Inject into the skin every 14 (fourteen) days.     LATANOPROST OP Apply to eye daily.     losartan (COZAAR) 50 MG tablet Take 1 tablet (50 mg total) by mouth daily. 90 tablet 1   metoprolol succinate (TOPROL-XL) 100 MG 24 hr tablet TAKE ONE TABLET (100MG TOTAL) BY MOUTH DAILY. TAKE WITH OR IMMEDIATELY FOLLOWING A MEAL. TAKING A TOTAL OF 150MG DAILY 90 tablet 3   pantoprazole (PROTONIX) 20 MG tablet Take 1 tablet (20 mg total) by mouth daily. 90 tablet 3   diltiazem (CARDIZEM CD) 240 MG 24 hr capsule TAKE ONE CAPSULE (240 MG TOTAL) BY MOUTHDAILY. 90 capsule 3   No current facility-administered medications for  this visit.   Allergies:  Penicillins   ROS:  No syncope.  Physical Exam: VS:  BP (!) 160/98   Pulse 75   Ht 5' 3"  (1.6 m)   Wt 195 lb 3.2 oz (88.5 kg)   SpO2 98%   BMI 34.58 kg/m , BMI Body mass index is 34.58 kg/m.  Wt Readings from Last 3 Encounters:  03/27/22 195 lb 3.2 oz (88.5 kg)  01/18/22 180 lb (81.6 kg)  09/03/21 189 lb 12.8 oz (86.1 kg)    General: Patient appears comfortable at rest. HEENT: Conjunctiva and lids normal. Neck: Supple,  no elevated JVP or carotid bruits, no thyromegaly. Lungs: Clear to auscultation, nonlabored breathing at rest. Cardiac: Regular rate and rhythm, no S3, 2/6 systolic murmur, no pericardial rub. Extremities: No pitting edema.  ECG:  An ECG dated 09/03/2021 was personally reviewed today and demonstrated:  Sinus rhythm with right bundle branch block.  Recent Labwork: 08/19/2021: ALT 18; AST 14; BUN 14; Creatinine, Ser 0.79; Hemoglobin 14.5; Platelets 195; Potassium 4.3; Sodium 140  September 2023: TSH 2.08, hemoglobin 15.6, platelets 257, BUN 12, creatinine 0.94, potassium 4.4, AST 20, ALT 20, cholesterol 185, triglycerides 156, HDL 56, LDL 102  Other Studies Reviewed Today:  Echocardiogram 09/12/2021:  1. Left ventricular ejection fraction, by estimation, is 60 to 65%. The  left ventricle has normal function. The left ventricle has no regional  wall motion abnormalities. There is mild left ventricular hypertrophy.  Left ventricular diastolic parameters  are consistent with Grade I diastolic dysfunction (impaired relaxation).   2. Right ventricular systolic function is low normal. The right  ventricular size is moderately enlarged. There is moderately elevated  pulmonary artery systolic pressure. The estimated right ventricular  systolic pressure is 12.4 mmHg.   3. Left atrial size was moderately dilated.   4. Right atrial size was upper normal.   5. A small pericardial effusion is present. The pericardial effusion is  posterior to the left ventricle.   6. The mitral valve is degenerative. Mild mitral valve regurgitation.   7. The aortic valve is tricuspid. Aortic valve regurgitation is not  visualized. Aortic valve mean gradient measures 3.0 mmHg.   8. The inferior vena cava is normal in size with greater than 50%  respiratory variability, suggesting right atrial pressure of 3 mmHg.   Assessment and Plan:  1.  PSVT, no progressive palpitations on current regimen including Cardizem CD and  Toprol-XL to be continued at current dosing.  2.  Essential hypertension.  We increased Cozaar to 50 mg daily at the last visit.  I have asked her to check blood pressures at home for least a week and report back.  3.  Aortic valve sclerosis without stenosis.  Medication Adjustments/Labs and Tests Ordered: Current medicines are reviewed at length with the patient today.  Concerns regarding medicines are outlined above.   Tests Ordered: No orders of the defined types were placed in this encounter.   Medication Changes: Meds ordered this encounter  Medications   diltiazem (CARDIZEM CD) 240 MG 24 hr capsule    Sig: TAKE ONE CAPSULE (240 MG TOTAL) BY MOUTHDAILY.    Dispense:  90 capsule    Refill:  3    Disposition:  Follow up  6 months.  Signed, Satira Sark, MD, [facility] 03/27/2022 8:33 AM    [place] [facility] [place]. [address] [place], [place] [address] Phone: [phone]; Fax: [phone]

## 2022-03-27 ENCOUNTER — Ambulatory Visit: Payer: Medicare Other | Attending: Cardiology | Admitting: Cardiology

## 2022-03-27 ENCOUNTER — Ambulatory Visit: Payer: Medicare Other | Admitting: Cardiology

## 2022-03-27 ENCOUNTER — Encounter: Payer: Self-pay | Admitting: Cardiology

## 2022-03-27 VITALS — BP 160/98 | HR 75 | Ht 63.0 in | Wt 195.2 lb

## 2022-03-27 DIAGNOSIS — I10 Essential (primary) hypertension: Secondary | ICD-10-CM | POA: Insufficient documentation

## 2022-03-27 DIAGNOSIS — I471 Supraventricular tachycardia, unspecified: Secondary | ICD-10-CM | POA: Diagnosis not present

## 2022-03-27 MED ORDER — DILTIAZEM HCL ER COATED BEADS 240 MG PO CP24: ORAL_CAPSULE | ORAL | 3 refills | Status: DC

## 2022-03-27 NOTE — Patient Instructions (Signed)
Medication Instructions:  Your physician recommends that you continue on your current medications as directed. Please refer to the Current Medication list given to you today.   Labwork: None today  Testing/Procedures: None today  Follow-Up: 6 months  Any Other Special Instructions Will Be Listed Below (If Applicable).  If you need a refill on your cardiac medications before your next appointment, please call your pharmacy.

## 2022-03-28 ENCOUNTER — Ambulatory Visit: Payer: Medicare Other | Admitting: Cardiology

## 2022-04-11 DIAGNOSIS — N3001 Acute cystitis with hematuria: Secondary | ICD-10-CM | POA: Diagnosis not present

## 2022-04-11 DIAGNOSIS — M816 Localized osteoporosis [Lequesne]: Secondary | ICD-10-CM | POA: Diagnosis not present

## 2022-04-15 ENCOUNTER — Other Ambulatory Visit: Payer: Self-pay

## 2022-04-15 DIAGNOSIS — M810 Age-related osteoporosis without current pathological fracture: Secondary | ICD-10-CM | POA: Insufficient documentation

## 2022-04-16 ENCOUNTER — Telehealth: Payer: Self-pay | Admitting: Pharmacy Technician

## 2022-04-16 NOTE — Telephone Encounter (Addendum)
[place]  Auth Submission: NO AUTH NEEDED Payer: MEDICARE A/B & [employer]IG[employer] SUPP Medication & CPT/J Code(s) submitted: Prolia (Denosumab) [id number] Route of submission (phone, fax, portal):  Phone # Fax # Auth type: Buy/Bill Units/visits requested: X1 Reference number:  Approval from: 04/16/22 to 05/19/23  05/15/22 - Medicare part b and supplement eligibility verified and approval extended till 05/19/23

## 2022-05-01 ENCOUNTER — Other Ambulatory Visit: Payer: Self-pay

## 2022-05-06 ENCOUNTER — Encounter: Payer: Self-pay | Admitting: Cardiology

## 2022-05-06 ENCOUNTER — Telehealth: Payer: Self-pay | Admitting: *Deleted

## 2022-05-06 ENCOUNTER — Encounter (HOSPITAL_COMMUNITY)
Admission: RE | Admit: 2022-05-06 | Discharge: 2022-05-06 | Disposition: A | Discharge status: Home / Self Care | Payer: Medicare Other | Source: Ambulatory Visit | Attending: Adult Health Nurse Practitioner | Admitting: Adult Health Nurse Practitioner

## 2022-05-06 VITALS — BP 182/88 | HR 63 | Temp 98.3°F | Resp 16

## 2022-05-06 DIAGNOSIS — M810 Age-related osteoporosis without current pathological fracture: Secondary | ICD-10-CM | POA: Insufficient documentation

## 2022-05-06 DIAGNOSIS — I10 Essential (primary) hypertension: Secondary | ICD-10-CM

## 2022-05-06 DIAGNOSIS — Z79899 Other long term (current) drug therapy: Secondary | ICD-10-CM

## 2022-05-06 MED ORDER — DENOSUMAB 60 MG/ML ~~LOC~~ SOSY
60.0000 mg | PREFILLED_SYRINGE | Freq: Once | SUBCUTANEOUS | Status: AC
  Administered 2022-05-06: 60 mg via SUBCUTANEOUS
  Filled 2022-05-06 (×2): qty 1

## 2022-05-06 MED ORDER — LOSARTAN POTASSIUM 50 MG PO TABS: 50.0000 mg | ORAL_TABLET | Freq: Two times a day (BID) | ORAL | 3 refills | Status: DC

## 2022-05-06 NOTE — Progress Notes (Signed)
Diagnosis: Osteoporosis  Provider:  Pablo Lawrence NP  Procedure: Injection  Prolia (Denosumab), Dose: 60 mg, Site: subcutaneous, Number of injections: 1  Post Care: Observation period completed  Discharge: Condition: Good, Destination: Home . AVS provided to patient.   Performed by:  Baxter Hire, RN

## 2022-05-06 NOTE — Addendum Note (Signed)
Encounter addended by: Baxter Hire, RN on: 05/06/2022 11:01 AM  Actions taken: Therapy plan modified

## 2022-05-06 NOTE — Telephone Encounter (Signed)
Thank you for the follow-up.  Blood pressure was 160/98 at our last visit in November on Cozaar 50 mg daily.  I would suggest increasing Cozaar to 50 mg twice daily, continue to track blood pressure.  We will check BMET in a few weeks as well.   Pt notified of lab work and medication changes via [place].

## 2022-05-09 ENCOUNTER — Other Ambulatory Visit: Payer: Self-pay

## 2022-05-09 DIAGNOSIS — D509 Iron deficiency anemia, unspecified: Secondary | ICD-10-CM

## 2022-06-16 ENCOUNTER — Other Ambulatory Visit: Payer: Self-pay

## 2022-06-16 MED ORDER — HUMIRA (2 PEN) 40 MG/0.4ML ~~LOC~~ AJKT: 40.0000 mg | AUTO-INJECTOR | SUBCUTANEOUS | 3 refills | Status: DC

## 2022-07-17 ENCOUNTER — Encounter: Payer: Self-pay | Admitting: Radiology

## 2022-08-04 ENCOUNTER — Other Ambulatory Visit: Payer: Self-pay | Admitting: Cardiology

## 2022-09-22 ENCOUNTER — Encounter (HOSPITAL_COMMUNITY): Payer: Self-pay | Admitting: Hematology

## 2022-09-22 ENCOUNTER — Encounter (HOSPITAL_COMMUNITY): Payer: Self-pay | Admitting: Adult Health Nurse Practitioner

## 2022-10-22 ENCOUNTER — Encounter: Payer: Self-pay | Admitting: Student

## 2022-10-22 ENCOUNTER — Other Ambulatory Visit: Payer: Self-pay

## 2022-10-22 ENCOUNTER — Ambulatory Visit: Payer: 59 | Attending: Cardiology | Admitting: Student

## 2022-10-22 ENCOUNTER — Ambulatory Visit: Payer: Medicare Other | Admitting: Cardiology

## 2022-10-22 VITALS — BP 122/80 | HR 88 | Ht 63.0 in | Wt 179.0 lb

## 2022-10-22 DIAGNOSIS — I4891 Unspecified atrial fibrillation: Secondary | ICD-10-CM

## 2022-10-22 DIAGNOSIS — I10 Essential (primary) hypertension: Secondary | ICD-10-CM | POA: Diagnosis not present

## 2022-10-22 DIAGNOSIS — I471 Supraventricular tachycardia, unspecified: Secondary | ICD-10-CM

## 2022-10-22 MED ORDER — METOPROLOL SUCCINATE ER 25 MG PO TB24: 25.0000 mg | ORAL_TABLET | Freq: Every day | ORAL | 3 refills | Status: DC

## 2022-10-22 MED ORDER — APIXABAN 5 MG PO TABS: 5.0000 mg | ORAL_TABLET | Freq: Two times a day (BID) | ORAL | 11 refills | Status: DC

## 2022-10-22 MED ORDER — METOPROLOL SUCCINATE ER 100 MG PO TB24: 100.0000 mg | ORAL_TABLET | Freq: Every day | ORAL | 3 refills | Status: DC

## 2022-10-22 MED ORDER — LOSARTAN POTASSIUM 50 MG PO TABS: 50.0000 mg | ORAL_TABLET | Freq: Every day | ORAL | 3 refills | Status: DC

## 2022-10-22 NOTE — Patient Instructions (Signed)
Medication Instructions:   Your physician has recommended you make the following change in your medication:  Start Eliquis 5 mg Two Times Daily  Take Toprol XL 100 mg in the AM and take 25 mg in the PM   *If you need a refill on your cardiac medications before your next appointment, please call your pharmacy*   Lab Work: Your physician recommends that you return for lab work in: Today ( CBC, BMET, Magnesium, TSH)   If you have labs (blood work) drawn today and your tests are completely normal, you will receive your results only by: MyChart Message (if you have MyChart) OR A paper copy in the mail If you have any lab test that is abnormal or we need to change your treatment, we will call you to review the results.   Testing/Procedures: NONE    Follow-Up: At [facility], you and your health needs are our priority.  As part of our continuing mission to provide you with exceptional heart care, we have created designated Provider Care Teams.  These Care Teams include your primary Cardiologist (physician) and Advanced Practice Providers (APPs -  Physician Assistants and Nurse Practitioners) who all work together to provide you with the care you need, when you need it.  We recommend signing up for the patient portal called "MyChart".  Sign up information is provided on this After Visit Summary.  MyChart is used to connect with patients for Virtual Visits (Telemedicine).  Patients are able to view lab/test results, encounter notes, upcoming appointments, etc.  Non-urgent messages can be sent to your provider as well.   To learn more about what you can do with MyChart, go to [email or web address].    Your next appointment:   6 -8 week(s)  Provider:   Nona Dell, MD or Randall An, PA-C    Other Instructions Thank you for choosing [facility]!

## 2022-10-22 NOTE — Progress Notes (Signed)
Cardiology Office Note    Date:  10/22/2022  ID:  Julia Martinez, DOB 02-21-1951, MRN [id number] Cardiologist: Nona Dell, MD    History of Present Illness:    Julia Martinez is a 72 y.o. female with past medical history of HTN, PSVT and GERD who presents to the office today for 33-month follow-up.  She was examined by Dr. Diona Browner in 03/2022 and denied any recent anginal symptoms at that time or changes in her palpitations. BP was elevated at 160/98 and she was encouraged to keep a BP log and report back with readings. She was continued on her current cardiac medications including Toprol-XL 100 mg daily, Cardizem CD 240 mg daily and Losartan 50mg  daily.  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 Over the past week, she has noticed worsening fatigue with intermittent palpitations. Felt like she was "having a pain attack" at times. Reports a funny feeling along her chest at times but no persistent pain. No recent orthopnea, PND or pitting edema.  Studies Reviewed:   EKG: EKG is ordered today and demonstrates atrial fibrillation with RVR, HR 130 with known RBBB.   Echocardiogram: 08/2021 IMPRESSIONS     1. Left ventricular ejection fraction, by estimation, is 60 to 65%. The  left ventricle has normal function. The left ventricle has no regional  wall motion abnormalities. There is mild left ventricular hypertrophy.  Left ventricular diastolic parameters  are consistent with Grade I diastolic dysfunction (impaired relaxation).   2. Right ventricular systolic function is low normal. The right  ventricular size is moderately enlarged. There is moderately elevated  pulmonary artery systolic pressure. The estimated right ventricular  systolic pressure is 47.6 mmHg.   3. Left atrial size was moderately dilated.    4. Right atrial size was upper normal.   5. A small pericardial effusion is present. The pericardial effusion is  posterior to the left ventricle.   6. The mitral valve is degenerative. Mild mitral valve regurgitation.   7. The aortic valve is tricuspid. Aortic valve regurgitation is not  visualized. Aortic valve mean gradient measures 3.0 mmHg.   8. The inferior vena cava is normal in size with greater than 50%  respiratory variability, suggesting right atrial pressure of 3 mmHg.   Comparison(s): Prior images unable to be directly viewed.    Risk Assessment/Calculations:   CHA2DS2-VASc Score = 3   This indicates a 3.2% annual risk of stroke. The patient's score is based upon: CHF History: 0 HTN History: 1 Diabetes History: 0 Stroke History: 0 Vascular Disease History: 0 Age Score: 1 Gender Score: 1   Physical Exam:   VS:  BP 122/80   Pulse 88   Ht 5\' 3"  (1.6 m)   Wt 179 lb (81.2 kg)   SpO2 95%   BMI 31.71 kg/m    Wt Readings from Last 3 Encounters:  10/22/22 179 lb (81.2 kg)  03/27/22 195 lb 3.2 oz (88.5 kg)  01/18/22 180 lb (81.6 kg)     GEN: Pleasant female appearing in no acute distress. NECK: No JVD; No carotid bruits CARDIAC: Irregularly irregular, no murmurs, rubs, gallops RESPIRATORY:  Clear to auscultation without rales, wheezing or rhonchi  ABDOMEN: Appears non-distended. No obvious abdominal masses. EXTREMITIES: No clubbing or cyanosis. No pitting edema.  Distal pedal pulses are 2+  bilaterally.   Assessment and Plan:   1. Atrial Fibrillation with RVR - This is a new diagnosis for the patient today and suspect her arrhythmia started at least 3 to 4 days ago based off symptoms at that time. Her heart rate was initially in the 130's but did improve to 88 bpm with rest. Reviewed options in regards to management and she wants to avoid admission given that she is a primary caregiver for her son. Will plan to titrate Toprol-XL from 100 mg daily to 125 mg daily  while continuing on Cardizem CD 240 mg daily. Will start Eliquis 5 mg twice daily for anticoagulation.   - Will arrange for a nurse visit for an EKG in 2 weeks. If she is still in atrial fibrillation at that time, will plan for DCCV once she has completed 3 weeks of uninterrupted anticoagulation and this was reviewed with the patient today. Also reviewed plan of care with Dr. Diona Browner. Will recheck labs to include CBC, BMET, TSH and Mg. We reviewed warning signs to monitor for which would prompt Emergency Department evaluation in the interim.   2. HTN - Her blood pressure is well-controlled at 122/80 during today's visit. She is currently taking Cardizem CD 240 mg daily, Losartan 50 mg daily (listed as BID but only taking once daily) and Toprol-XL 100 mg daily. Will titrate Toprol-XL to a total dose of 125 mg daily.  3. SVT - She has a history of SVT but found to have atrial fibrillation with RVR during this office visit.  Will continue Cardizem CD 240 mg daily but titrate Toprol-XL from 100 mg daily to 100mg  in AM/25mg  in PM as discussed above.     Signed, Ellsworth Lennox, PA-C

## 2022-10-22 NOTE — Addendum Note (Signed)
Addended by: Leonides Schanz C on: 10/22/2022 03:03 PM   Modules accepted: Orders

## 2022-10-27 ENCOUNTER — Encounter (HOSPITAL_COMMUNITY): Payer: Self-pay | Admitting: Adult Health Nurse Practitioner

## 2022-10-27 ENCOUNTER — Other Ambulatory Visit (HOSPITAL_COMMUNITY)
Admission: RE | Admit: 2022-10-27 | Discharge: 2022-10-27 | Disposition: A | Discharge status: Home / Self Care | Payer: HMO | Source: Ambulatory Visit | Attending: Cardiology | Admitting: Cardiology

## 2022-10-27 ENCOUNTER — Encounter (HOSPITAL_COMMUNITY): Payer: Self-pay | Admitting: Hematology

## 2022-10-27 DIAGNOSIS — I4891 Unspecified atrial fibrillation: Secondary | ICD-10-CM | POA: Insufficient documentation

## 2022-10-27 LAB — CBC
HCT: 43.2 % (ref 36.0–46.0)
Hemoglobin: 14.2 g/dL (ref 12.0–15.0)
MCH: 29.8 pg (ref 26.0–34.0)
MCHC: 32.9 g/dL (ref 30.0–36.0)
MCV: 90.8 fL (ref 80.0–100.0)
Platelets: 228 10*3/uL (ref 150–400)
RBC: 4.76 MIL/uL (ref 3.87–5.11)
RDW: 13.4 % (ref 11.5–15.5)
WBC: 8.8 10*3/uL (ref 4.0–10.5)
nRBC: 0 % (ref 0.0–0.2)

## 2022-10-27 LAB — BASIC METABOLIC PANEL
Anion gap: 6 (ref 5–15)
BUN: 17 mg/dL (ref 8–23)
CO2: 25 mmol/L (ref 22–32)
Calcium: 9.7 mg/dL (ref 8.9–10.3)
Chloride: 104 mmol/L (ref 98–111)
Creatinine, Ser: 0.94 mg/dL (ref 0.44–1.00)
GFR, Estimated: >60 mL/min (ref >60)
Glucose, Bld: 139 mg/dL — ABNORMAL HIGH (ref 70–99)
Potassium: 4.1 mmol/L (ref 3.5–5.1)
Sodium: 135 mmol/L (ref 135–145)

## 2022-10-27 LAB — MAGNESIUM: Magnesium: 1.9 mg/dL (ref 1.7–2.4)

## 2022-10-27 LAB — TSH: TSH: 1.563 u[IU]/mL (ref 0.350–4.500)

## 2022-11-03 NOTE — Progress Notes (Signed)
Order(s) created erroneously. Erroneous order ID: [id number]  Order moved by: Leonia Corona  Order move date/time: 11/03/2022 4:42 PM  Source Patient: [id number]  Source Contact: 10/22/2022  Destination Patient: [id number]  Destination Contact: 10/29/2022

## 2022-11-05 ENCOUNTER — Encounter (HOSPITAL_COMMUNITY): Payer: 59 | Attending: Adult Health Nurse Practitioner | Admitting: *Deleted

## 2022-11-05 ENCOUNTER — Encounter (HOSPITAL_COMMUNITY): Admission: RE | Admit: 2022-11-05 | Payer: 59 | Source: Ambulatory Visit

## 2022-11-05 ENCOUNTER — Other Ambulatory Visit (HOSPITAL_COMMUNITY): Payer: Self-pay | Admitting: Adult Health Nurse Practitioner

## 2022-11-05 DIAGNOSIS — I10 Essential (primary) hypertension: Secondary | ICD-10-CM | POA: Diagnosis not present

## 2022-11-05 DIAGNOSIS — Z1231 Encounter for screening mammogram for malignant neoplasm of breast: Secondary | ICD-10-CM

## 2022-11-05 NOTE — Progress Notes (Signed)
   EKG reviewed and she has converted back to normal sinus rhythm since the time of her last office visit. She has only been taking Toprol-XL 100 mg daily and she will continue on this dose given her heart rate in the 50's. She also remains on Cardizem CD as she has been on a combination of the two. I did recommend that she continue Eliquis for now since she is at risk for recurrent episodes.  Signed, Ellsworth Lennox, PA-C 11/05/2022, 4:24 PM

## 2022-11-05 NOTE — Patient Instructions (Signed)
Medication Instructions:  Your physician recommends that you continue on your current medications as directed. Please refer to the Current Medication list given to you today.   Labwork: NONE   Testing/Procedures: NONE   Follow-Up: As Planned   Any Other Special Instructions Will Be Listed Below (If Applicable).     If you need a refill on your cardiac medications before your next appointment, please call your pharmacy.    

## 2022-11-05 NOTE — Progress Notes (Signed)
Pt in office for EKG. Randall An, PA in to speak with pt

## 2022-11-18 ENCOUNTER — Encounter (HOSPITAL_COMMUNITY): Payer: Self-pay | Admitting: Hematology

## 2022-11-18 ENCOUNTER — Encounter (HOSPITAL_COMMUNITY): Payer: Self-pay | Admitting: Adult Health Nurse Practitioner

## 2022-11-24 ENCOUNTER — Encounter (HOSPITAL_COMMUNITY): Payer: Self-pay | Admitting: Adult Health Nurse Practitioner

## 2022-11-24 ENCOUNTER — Encounter (HOSPITAL_COMMUNITY): Payer: Self-pay | Admitting: Hematology

## 2022-11-24 ENCOUNTER — Encounter (HOSPITAL_COMMUNITY)
Admission: RE | Admit: 2022-11-24 | Discharge: 2022-11-24 | Disposition: A | Discharge status: Home / Self Care | Payer: HMO | Source: Ambulatory Visit | Attending: Adult Health Nurse Practitioner | Admitting: Adult Health Nurse Practitioner

## 2022-11-24 VITALS — BP 177/90 | HR 55 | Temp 97.4°F | Resp 18

## 2022-11-24 DIAGNOSIS — Z79620 Long term (current) use of immunosuppressive biologic: Secondary | ICD-10-CM | POA: Insufficient documentation

## 2022-11-24 DIAGNOSIS — M810 Age-related osteoporosis without current pathological fracture: Secondary | ICD-10-CM | POA: Diagnosis present

## 2022-11-24 MED ORDER — DENOSUMAB 60 MG/ML ~~LOC~~ SOSY
60.0000 mg | PREFILLED_SYRINGE | Freq: Once | SUBCUTANEOUS | Status: AC
  Administered 2022-11-24: 60 mg via SUBCUTANEOUS

## 2022-11-24 NOTE — Progress Notes (Signed)
Diagnosis: Osteoporosis  Provider:  Roe Rutherford NP  Procedure: Injection  Prolia (Denosumab), Dose: 60 mg, Site: subcutaneous in right arm, Number of injections: 1  Post Care: observation declined  Discharge: Condition: Good, Destination: Home . AVS provided to patient.   Performed by:  Arrie Senate, RN

## 2022-12-03 ENCOUNTER — Other Ambulatory Visit: Payer: Self-pay

## 2022-12-16 ENCOUNTER — Encounter: Payer: Self-pay | Admitting: Student

## 2022-12-16 ENCOUNTER — Ambulatory Visit: Payer: HMO | Attending: Student | Admitting: Student

## 2022-12-16 VITALS — BP 128/80 | HR 62 | Ht 63.0 in | Wt 175.2 lb

## 2022-12-16 DIAGNOSIS — I471 Supraventricular tachycardia, unspecified: Secondary | ICD-10-CM | POA: Diagnosis not present

## 2022-12-16 DIAGNOSIS — I10 Essential (primary) hypertension: Secondary | ICD-10-CM | POA: Diagnosis not present

## 2022-12-16 DIAGNOSIS — I480 Paroxysmal atrial fibrillation: Secondary | ICD-10-CM

## 2022-12-16 NOTE — Progress Notes (Signed)
Cardiology Office Note    Date:  12/16/2022  ID:  TAKAKO ASP, DOB 01-Aug-1950, MRN [id number] Cardiologist: Nona Dell, MD    History of Present Illness:    Julia Martinez is a 72 y.o. female with past medical history of paroxysmal atrial fibrillation, HTN, PSVT and GERD who presents to the office today for 42-month follow-up.   She was last examined by myself in 10/2022 and reported worsening palpitations and fatigue over the past few weeks. She was found to be in atrial fibrillation with RVR and Toprol-XL was titrated from 100 mg daily to 125 mg daily and she was continued on Cardizem CD 240 mg daily. She was started on Eliquis 5 mg twice daily for anticoagulation with plans for a repeat EKG in 2 weeks and if she remained in atrial fibrillation, would plan for a DCCV following 3 weeks of uninterrupted anticoagulation. She had converted back to normal sinus rhythm at the time of her follow-up EKG on 11/05/2022 and was only taking Toprol-XL 100 mg daily and Cardizem CD at her current dose. No changes were made to her medical therapy at that time.  In talking with patient today, she has been under increased stress over the past few months as her son has been hospitalized twice. Says he is now home and overall doing well given the circumstances. She experiences occasional palpitations but says this spontaneously resolves within a few minutes. No persistent fatigue resembling her prior atrial fibrillation. Her breathing has been stable with no recent orthopnea, PND, pitting edema or dyspnea on exertion. She remains on Eliquis for anticoagulation with no reports of active bleeding. Says she does sometimes miss her second dose of this.  Studies Reviewed:   EKG: EKG is not ordered today. EKG from 11/07/2022 is reviewed and shows sinus bradycardia, HR 58 with known RBBB.   Echocardiogram: 08/2021 IMPRESSIONS     1. Left ventricular ejection fraction, by estimation, is 60 to 65%. The  left  ventricle has normal function. The left ventricle has no regional  wall motion abnormalities. There is mild left ventricular hypertrophy.  Left ventricular diastolic parameters  are consistent with Grade I diastolic dysfunction (impaired relaxation).   2. Right ventricular systolic function is low normal. The right  ventricular size is moderately enlarged. There is moderately elevated  pulmonary artery systolic pressure. The estimated right ventricular  systolic pressure is 47.6 mmHg.   3. Left atrial size was moderately dilated.   4. Right atrial size was upper normal.   5. A small pericardial effusion is present. The pericardial effusion is  posterior to the left ventricle.   6. The mitral valve is degenerative. Mild mitral valve regurgitation.   7. The aortic valve is tricuspid. Aortic valve regurgitation is not  visualized. Aortic valve mean gradient measures 3.0 mmHg.   8. The inferior vena cava is normal in size with greater than 50%  respiratory variability, suggesting right atrial pressure of 3 mmHg.   Comparison(s): Prior images unable to be directly viewed.    Physical Exam:   VS:  BP 128/80   Pulse 62   Ht 5\' 3"  (1.6 m)   Wt 175 lb 3.2 oz (79.5 kg)   SpO2 97%   BMI 31.04 kg/m    Wt Readings from Last 3 Encounters:  12/16/22 175 lb 3.2 oz (79.5 kg)  10/22/22 179 lb (81.2 kg)  03/27/22 195 lb 3.2 oz (88.5 kg)     GEN: Well nourished, well developed female  appearing in no acute distress NECK: No JVD; No carotid bruits CARDIAC: RRR, no murmurs, rubs, gallops RESPIRATORY:  Clear to auscultation without rales, wheezing or rhonchi  ABDOMEN: Appears non-distended. No obvious abdominal masses. EXTREMITIES: No clubbing or cyanosis. No pitting edema.  Distal pedal pulses are 2+ bilaterally.   Assessment and Plan:   1. Paroxysmal Atrial Fibrillation - She denies any persistent fatigue resembling her prior atrial fibrillation. She was in normal sinus rhythm at the time of  her nurse visit and is in normal sinus rhythm by examination today. Continue Cardizem CD 240 mg daily and Toprol-XL 100 mg daily for rate-control. - No reports of active bleeding. She remains on Eliquis 5 mg twice daily for anticoagulation. I encouraged her to make [place] aware if she continues to miss a dose of Eliquis as we could switch to Xarelto for once daily dosing. At this time, she wishes to continue on Eliquis and will set an alarm to remember her doses.  2. SVT - She reports occasional, brief palpitations but no persistent symptoms. Continue current medical therapy with Cardizem CD 240 mg daily and Toprol-XL 100 mg daily.  3. HTN - BP was initially recorded at 142/88, rechecked and improved to 128/80. Continue current medical therapy with Cardizem CD 240 mg daily, Losartan 50 mg daily and Toprol-XL 100 mg daily.  Signed, Ellsworth Lennox, PA-C

## 2022-12-16 NOTE — Patient Instructions (Signed)
Medication Instructions:  Your physician recommends that you continue on your current medications as directed. Please refer to the Current Medication list given to you today.  *If you need a refill on your cardiac medications before your next appointment, please call your pharmacy*   Lab Work: NONE   If you have labs (blood work) drawn today and your tests are completely normal, you will receive your results only by: MyChart Message (if you have MyChart) OR A paper copy in the mail If you have any lab test that is abnormal or we need to change your treatment, we will call you to review the results.   Testing/Procedures: NONE    Follow-Up: At [facility], you and your health needs are our priority.  As part of our continuing mission to provide you with exceptional heart care, we have created designated Provider Care Teams.  These Care Teams include your primary Cardiologist (physician) and Advanced Practice Providers (APPs -  Physician Assistants and Nurse Practitioners) who all work together to provide you with the care you need, when you need it.  We recommend signing up for the patient portal called "MyChart".  Sign up information is provided on this After Visit Summary.  MyChart is used to connect with patients for Virtual Visits (Telemedicine).  Patients are able to view lab/test results, encounter notes, upcoming appointments, etc.  Non-urgent messages can be sent to your provider as well.   To learn more about what you can do with MyChart, go to [email or web address].    Your next appointment:   6 month(s)  Provider:   You may see Samuel McDowell, MD or one of the following Advanced Practice Providers on your designated Care Team:   Brittany Strader, PA-C  Michele Lenze, PA-C     Other Instructions Thank you for choosing  [facility]!    

## 2023-01-28 ENCOUNTER — Other Ambulatory Visit: Payer: Self-pay

## 2023-03-12 ENCOUNTER — Other Ambulatory Visit: Payer: Self-pay | Admitting: Cardiology

## 2023-04-02 ENCOUNTER — Other Ambulatory Visit: Payer: Self-pay

## 2023-05-27 ENCOUNTER — Telehealth: Payer: Self-pay | Admitting: Emergency Medicine

## 2023-05-27 NOTE — Telephone Encounter (Signed)
 Called Roe Rutherford NP office to request new prolia order.

## 2023-05-28 ENCOUNTER — Inpatient Hospital Stay (HOSPITAL_COMMUNITY): Admission: RE | Admit: 2023-05-28 | Payer: HMO | Source: Ambulatory Visit

## 2023-05-28 ENCOUNTER — Encounter (HOSPITAL_COMMUNITY): Payer: Self-pay | Admitting: Hematology

## 2023-05-28 ENCOUNTER — Ambulatory Visit: Payer: HMO

## 2023-06-01 ENCOUNTER — Ambulatory Visit: Payer: HMO

## 2023-06-04 ENCOUNTER — Ambulatory Visit: Payer: HMO

## 2023-06-11 ENCOUNTER — Encounter (HOSPITAL_COMMUNITY): Payer: Self-pay | Admitting: Hematology

## 2023-06-11 ENCOUNTER — Other Ambulatory Visit: Payer: Self-pay

## 2023-06-11 ENCOUNTER — Encounter: Payer: Self-pay | Admitting: Adult Health Nurse Practitioner

## 2023-06-11 ENCOUNTER — Telehealth: Payer: Self-pay

## 2023-06-11 MED ORDER — HUMIRA (2 PEN) 40 MG/0.4ML ~~LOC~~ AJKT: 40.0000 mg | AUTO-INJECTOR | SUBCUTANEOUS | 3 refills | Status: AC

## 2023-06-11 NOTE — Telephone Encounter (Signed)
Auth Submission: APPROVED Site of care: Site of care: [facility] Payer: [employer] Medication & CPT/J Code(s) submitted: Prolia (Denosumab) [id number] Route of submission (phone, fax, portal): portal Phone # Fax # Auth type: Buy/Bill PB Units/visits requested: 60mg , q60months Reference number: [id number] Approval from: 06/11/23 to 06/10/24

## 2023-06-18 ENCOUNTER — Encounter: Payer: Medicare Other | Attending: Adult Health Nurse Practitioner | Admitting: *Deleted

## 2023-06-18 VITALS — BP 136/71 | HR 63 | Temp 97.8°F | Resp 18

## 2023-06-18 DIAGNOSIS — M810 Age-related osteoporosis without current pathological fracture: Secondary | ICD-10-CM

## 2023-06-18 MED ORDER — DENOSUMAB 60 MG/ML ~~LOC~~ SOSY
60.0000 mg | PREFILLED_SYRINGE | Freq: Once | SUBCUTANEOUS | Status: AC
  Administered 2023-06-18: 60 mg via SUBCUTANEOUS

## 2023-06-18 NOTE — Progress Notes (Signed)
Diagnosis: Osteoporosis  Provider:  Roe Rutherford NP  Procedure: Injection  Prolia (Denosumab), Dose: 60 mg, Site: subcutaneous, Number of injections: 1  Injection Site(s): Right arm  Post Care: Observation period completed  Discharge: Condition: Good, Destination: Home . AVS Provided  Performed by:  Daleen Squibb, RN

## 2023-08-05 ENCOUNTER — Encounter: Payer: Self-pay | Admitting: Internal Medicine

## 2023-08-12 ENCOUNTER — Telehealth: Payer: Self-pay | Admitting: *Deleted

## 2023-08-12 ENCOUNTER — Other Ambulatory Visit: Payer: Self-pay | Admitting: *Deleted

## 2023-08-12 DIAGNOSIS — K5000 Crohn's disease of small intestine without complications: Secondary | ICD-10-CM

## 2023-08-12 DIAGNOSIS — M810 Age-related osteoporosis without current pathological fracture: Secondary | ICD-10-CM

## 2023-08-12 NOTE — Telephone Encounter (Signed)
 Patient called in due for dexa scan.  Scheduled for 4/15, arrival 945am, no calcium or calcium products x 48 hrs prior.  Pt aware of appt details and needed nothing further

## 2023-09-01 ENCOUNTER — Ambulatory Visit (HOSPITAL_COMMUNITY)
Admission: RE | Admit: 2023-09-01 | Discharge: 2023-09-01 | Disposition: A | Discharge status: Home / Self Care | Source: Ambulatory Visit | Attending: Internal Medicine | Admitting: Internal Medicine

## 2023-09-01 DIAGNOSIS — K5000 Crohn's disease of small intestine without complications: Secondary | ICD-10-CM | POA: Insufficient documentation

## 2023-09-01 DIAGNOSIS — M810 Age-related osteoporosis without current pathological fracture: Secondary | ICD-10-CM | POA: Insufficient documentation

## 2023-09-02 ENCOUNTER — Other Ambulatory Visit: Payer: Self-pay

## 2023-09-08 ENCOUNTER — Ambulatory Visit: Admitting: Internal Medicine

## 2023-09-22 ENCOUNTER — Ambulatory Visit: Admitting: Internal Medicine

## 2023-10-05 ENCOUNTER — Telehealth: Payer: Self-pay | Admitting: Cardiology

## 2023-10-05 ENCOUNTER — Ambulatory Visit: Attending: Cardiology | Admitting: Cardiology

## 2023-10-05 ENCOUNTER — Encounter: Payer: Self-pay | Admitting: Cardiology

## 2023-10-05 ENCOUNTER — Ambulatory Visit: Admitting: Internal Medicine

## 2023-10-05 ENCOUNTER — Encounter: Payer: Self-pay | Admitting: Internal Medicine

## 2023-10-05 VITALS — BP 107/68 | HR 97 | Temp 97.7°F | Ht 63.0 in | Wt 182.6 lb

## 2023-10-05 VITALS — BP 110/82 | HR 143 | Ht 63.0 in | Wt 182.0 lb

## 2023-10-05 DIAGNOSIS — M8580 Other specified disorders of bone density and structure, unspecified site: Secondary | ICD-10-CM | POA: Diagnosis not present

## 2023-10-05 DIAGNOSIS — K5000 Crohn's disease of small intestine without complications: Secondary | ICD-10-CM | POA: Diagnosis not present

## 2023-10-05 DIAGNOSIS — Z01818 Encounter for other preprocedural examination: Secondary | ICD-10-CM

## 2023-10-05 DIAGNOSIS — E559 Vitamin D deficiency, unspecified: Secondary | ICD-10-CM

## 2023-10-05 DIAGNOSIS — I471 Supraventricular tachycardia, unspecified: Secondary | ICD-10-CM

## 2023-10-05 DIAGNOSIS — R5383 Other fatigue: Secondary | ICD-10-CM | POA: Diagnosis not present

## 2023-10-05 DIAGNOSIS — I4891 Unspecified atrial fibrillation: Secondary | ICD-10-CM

## 2023-10-05 DIAGNOSIS — M810 Age-related osteoporosis without current pathological fracture: Secondary | ICD-10-CM

## 2023-10-05 DIAGNOSIS — I10 Essential (primary) hypertension: Secondary | ICD-10-CM

## 2023-10-05 MED ORDER — METOPROLOL SUCCINATE ER 100 MG PO TB24: ORAL_TABLET | ORAL | 3 refills | Status: DC

## 2023-10-05 NOTE — Progress Notes (Signed)
 Primary Care Physician:  Lorre Rosin, NP Primary Gastroenterologist:  Dr. Riley Cheadle  Pre-Procedure History & Physical: HPI:  Julia Martinez is a 72 y.o. female here for longstanding small bowel Crohn's disease.  Clinically, in remission; no abdominal pain ,altered bowel function , no bleeding - last colonoscopy 2021 ileitis noted.  CRPs have historically been negative tolerating Humira  very well.  No longer iron deficient.  As she is osteopenic, takes Prolia  but does not take her vitamin D/calcium supplement on a regular basis. She has had a difficult time with the loss of her husband last year and dealing with her son who has Prader-Willi and stays at home.  She remains on Eliquis  for atrial fibrillation.  Reviewed labs from [facility].  She is not anemic;  vitamin D/25 low. Reports does not take her vitamin D/calcium supplement with any regularity.  Past Medical History:  Diagnosis Date   Anemia    Depression    Essential hypertension    OSA (obstructive sleep apnea)    Mild by sleep study 2019   PSVT (paroxysmal supraventricular tachycardia) (HCC)    Ulcer of small intestine     Past Surgical History:  Procedure Laterality Date   BIOPSY  10/21/2019   Procedure: BIOPSY;  Surgeon: Suzette Espy, MD;  [facility];  Service: Endoscopy;;  small bowel   CESAREAN SECTION     COLONOSCOPY  2009   single external hemorrhoid tag, otherwise normal rectum. Numerous left-sided diverticula, abnormal IC valve and TI suggestive of Crohn's disease s/p biopsy. Biopsies with non-specific ulcerations. CTE then revealing chronic inflammatory change of distal TI, concerning for SB Crohn's. Did not start therapy due to absence of clinical symptoms and recommended [facility] evaluation.   COLONOSCOPY N/A 07/21/2017   Fields: Multiple ileal diverticulum, moderate diverticulosis in the entire examined colon, small external hemorrhoids.   COLONOSCOPY N/A 10/21/2019   Procedure:  COLONOSCOPY;  Surgeon: Suzette Espy, MD;  [facility];  Service: Endoscopy;  Laterality: N/A;  2:15pm   ESOPHAGOGASTRODUODENOSCOPY N/A 07/20/2017   Fields: Normal esophagus, small hiatal hernia, normal examined duodenum.  No blood in the upper GI tract.   GIVENS CAPSULE STUDY N/A 08/30/2019   Fields: Small bowel AVMs, erosions and ulcerated distal small bowel, likely in the distal jejunum/proximal ileum, study not complete the cecum.   WISDOM TOOTH EXTRACTION      Prior to Admission medications   Medication Sig Start Date End Date Taking? Authorizing Provider  adalimumab  (HUMIRA , 2 PEN,) 40 MG/0.4ML pen Inject 0.4 mLs (40 mg total) into the skin every 14 (fourteen) days. 06/11/23  Yes Thaddaeus Granja, Windsor Hatcher, MD  apixaban  (ELIQUIS ) 5 MG TABS tablet Take 1 tablet (5 mg total) by mouth 2 (two) times daily. 10/22/22  Yes Strader, Dimple Francis, PA-C  diltiazem  (CARDIZEM  CD) 240 MG 24 hr capsule TAKE ONE CAPSULE (240MG  TOTAL) BY MOUTH DAILY 03/12/23  Yes Gerard Knight, MD  losartan  (COZAAR ) 50 MG tablet Take 1 tablet (50 mg total) by mouth daily. 10/22/22 10/17/23 Yes Strader, Dimple Francis, PA-C  metoprolol  succinate (TOPROL -XL) 100 MG 24 hr tablet Take 1 tablet (100 mg total) by mouth daily. Take with or immediately following a meal. 10/22/22  Yes Strader, [place] M, PA-C  Vitamin D, Ergocalciferol, (DRISDOL) 1.25 MG (50000 UNIT) CAPS capsule Take 50,000 Units by mouth once a week. 08/12/23  Yes [provider]    Allergies as of 10/05/2023 - Review Complete 10/05/2023  Allergen Reaction Noted  Penicillins Nausea Only 10/06/2013    Family History  Problem Relation Age of Onset   Depression Mother    Ulcers Mother    Emphysema Father    Prader-Willi syndrome Son    Colon cancer Neg Hx    Colon polyps Neg Hx     Social History   Socioeconomic History   Marital status: Married    Spouse name: Not on file   Number of children: 3   Years of education: Not on file   Highest  education level: Not on file  Occupational History   Occupation: paraprofessional  Tobacco Use   Smoking status: Never   Smokeless tobacco: Never  Vaping Use   Vaping status: Never Used  Substance and Sexual Activity   Alcohol use: No   Drug use: No   Sexual activity: Not Currently  Other Topics Concern   Not on file  Social History Narrative   ** Merged History Encounter **       Social Drivers of Health   Financial Resource Strain: Low Risk  (08/10/2023)   Received from [facility]   Overall Financial Resource Strain (CARDIA)    Difficulty of Paying Living Expenses: Not very hard  Food Insecurity: No Food Insecurity (08/10/2023)   Received from [facility]   Hunger Vital Sign    Worried About Running Out of Food in the Last Year: Never true    Ran Out of Food in the Last Year: Never true  Transportation Needs: No Transportation Needs (08/10/2023)   Received from [facility] - Transportation    Lack of Transportation (Medical): No    Lack of Transportation (Non-Medical): No  Physical Activity: Unknown (08/10/2023)   Received from [facility]   Exercise Vital Sign    Days of Exercise per Week: 0 days    Minutes of Exercise per Session: Not on file  Stress: Stress Concern Present (08/10/2023)   Received from [facility] - Occupational Stress Questionnaire    Feeling of Stress : Very much  Social Connections: Somewhat Isolated (08/10/2023)   Received from [facility]   Social Network    How would you rate your social network (family, work, friends)?: Restricted participation with some degree of social isolation  Intimate Partner Violence: Not At Risk (08/10/2023)   Received from [facility]   HITS    Over the last 12 months how often did your partner physically hurt you?: Never    Over the last 12 months how often did your partner insult you or talk down to you?: Never    Over the last 12 months how often  did your partner threaten you with physical harm?: Never    Over the last 12 months how often did your partner scream or curse at you?: Never    Review of Systems: See HPI, otherwise negative ROS  Physical Exam: BP 107/68 (BP Location: Right Arm, Patient Position: Sitting, Cuff Size: Large)   Pulse 97   Temp 97.7 F (36.5 C) (Oral)   Ht 5\' 3"  (1.6 m)   Wt 182 lb 9.6 oz (82.8 kg)   SpO2 97%   BMI 32.35 kg/m  General:   Alert,  Well-developed, well-nourished, pleasant and cooperative in NAD Skin:  Intact without significant lesions or rashes. Lungs:  Clear throughout to auscultation.   No wheezes, crackles, or rhonchi. No acute distress. Heart:  Regular rate and rhythm; no murmurs, clicks, rubs,  or  gallops. Abdomen: Non-distended, normal bowel sounds.  Soft and nontender without appreciable mass or hepatosplenomegaly.   Impression/Plan:   Pleasant 73 year old lady with small bowel Crohn's disease.  Clinically in remission no bowel symptoms CRPs have been normal she needs an updated assessment.  She does not have colonic involvement.  Timing of 1 more colonoscopy to be determined.  Significantly osteopenic on Prolia .  She needs to take her vitamin D/calcium supplement every day.  Recommendations  vitamin D calcium supplement every day  Will add a CRP to recent labs.  Further recommendations to follow.   Notice: This dictation was prepared with Dragon dictation along with smaller phrase technology. Any transcriptional errors that result from this process are unintentional and may not be corrected upon review.

## 2023-10-05 NOTE — Patient Instructions (Addendum)
 Medication Instructions:   INCREASE Toprol  to 50 mg am and 100 mg pm  Labwork: CBC,BMET now  Testing/Procedures: Your physician has requested that you have a TEE/Cardioversion. During a TEE, sound waves are used to create images of your heart. It provides your doctor with information about the size and shape of your heart and how well your heart's chambers and valves are working. In this test, a transducer is attached to the end of a flexible tube that is guided down you throat and into your esophagus (the tube leading from your mouth to your stomach) to get a more detailed image of your heart. Once the TEE has determined that a blood clot is not present, the cardioversion begins. Electrical Cardioversion uses a jolt of electricity to your heart either through paddles or wired patches attached to your chest. This is a controlled, usually prescheduled, procedure. This procedure is done at the hospital and you are not awake during the procedure. You usually go home the day of the procedure. Please see the instruction sheet given to you today for more information.   Follow-Up: 2 weeks  Any Other Special Instructions Will Be Listed Below (If Applicable).  If you need a refill on your cardiac medications before your next appointment, please call your pharmacy.

## 2023-10-05 NOTE — Progress Notes (Signed)
 Cardiology Office Note:  .   Date:  10/05/2023  ID:  Julia Martinez, DOB 11-23-1950, MRN [id number] PCP: Lorre Rosin, NP   HeartCare Providers Cardiologist:  Teddie Favre, MD    History of Present Illness: .   Julia Martinez is a 73 y.o. female with a past medical history of paroxysmal atrial fibrillation, primary pretension, PSVT, GERD, who presents today being seen as a work in for increased complaints of fatigue.  Longstanding history of palpitations with echocardiogram completed in April 2023.  LVEF 60 to 65%, no RWMA, G1 DD, right ventricular systolic function was low normal and ventricular size is moderately enlarged, moderately elevated pulmonary artery systolic pressures, small pericardial effusion posterior to the left ventricle, mild MR.  She was last seen in clinic 12/06/2022 by B Strader, PA-C.  At that time she states she been under increased stress over the past few months and has not been hospitalized twice.  She 6.  Occasional palpitations.  Spontaneously resolves within a few minutes.  No persistent fatigue resembling her prior atrial fibrillation.  She remains on Eliquis  for anticoagulation and reported no bleeding with no blood noted in her urine or stool.  She was continued on Cardizem  240 mg daily Toprol -XL 100 mg daily for rate control.  As well as her apixaban  5 mg twice daily for OAC.  There were no further medication changes that were made testing that was needed to be ordered at that time.  She returns to clinic today with worsening fatigue, abdominal bloating, and edema.  She states that this morning she followed up with Dr. Riley Cheadle from GI for her annual Crohn's visit and her heart rate was 97.  On arrival to clinic today she is worked up and anxious and her heart rate is in the 140s.  EKG today reveals that she is back in atrial fibrillation with RVR.  Upon resting,  patient's heart rate did come down with sitting but only into the 90s.   Unfortunately she states that she has not taken her anticoagulation twice daily as prescribed she typically only takes it once a day.  She states that she has been taking her Toprol -XL 100 mg in the evening and diltiazem  240 mg daily and her additional metoprolol  25 mg in the morning.  She stated that she had noticed worsening fatigue that started on Friday of last week.  She also noted early satiety with abdominal bloating.  She was certain that she was back in A-fib as her fatigue had worsened since Friday.  She does not feel her elevated heart rate.  She denies any chest pain but occasionally has shortness of breath and peripheral edema.  She denies any recent hospitalizations or visits to the emergency department.  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  ROS: 10 point review of systems has been reviewed and considered negative with exception was been listed in the HPI  Studies Reviewed: Aaron Aas   EKG Interpretation Date/Time:  Monday Oct 05 2023 15:58:17 EDT Ventricular Rate:  144 PR Interval:    QRS Duration:  138 QT Interval:  338 QTC Calculation: 523 R Axis:   116  Text Interpretation: Atrial fibrillation with rvr Right axis deviation Non-specific intra-ventricular conduction block Right ventricular hypertrophy Cannot rule out Septal infarct (cited on or before 22-Oct-2022) T wave abnormality, consider inferior ischemia T wave abnormality, consider anterolateral ischemia When compared with ECG of 22-Oct-2022 13:03, Confirmed  by Ronald Cockayne ([id number]) on 10/05/2023 4:35:44 PM    Echocardiogram: 08/2021 IMPRESSIONS   1. Left ventricular ejection fraction, by estimation, is 60 to 65%. The  left ventricle has normal function. The left ventricle has no regional  wall motion abnormalities. There is mild left ventricular hypertrophy.  Left ventricular diastolic parameters  are consistent with Grade I diastolic dysfunction (impaired  relaxation).   2. Right ventricular systolic function is low normal. The right  ventricular size is moderately enlarged. There is moderately elevated  pulmonary artery systolic pressure. The estimated right ventricular  systolic pressure is 47.6 mmHg.   3. Left atrial size was moderately dilated.   4. Right atrial size was upper normal.   5. A small pericardial effusion is present. The pericardial effusion is  posterior to the left ventricle.   6. The mitral valve is degenerative. Mild mitral valve regurgitation.   7. The aortic valve is tricuspid. Aortic valve regurgitation is not  visualized. Aortic valve mean gradient measures 3.0 mmHg.   8. The inferior vena cava is normal in size with greater than 50%  respiratory variability, suggesting right atrial pressure of 3 mmHg.   Comparison(s): Prior images unable to be directly viewed.    Risk Assessment/Calculations:    CHA2DS2-VASc Score = 3   This indicates a 3.2% annual risk of stroke. The patient's score is based upon: CHF History: 0 HTN History: 1 Diabetes History: 0 Stroke History: 0 Vascular Disease History: 0 Age Score: 1 Gender Score: 1            Physical Exam:   VS:  BP 110/82 (BP Location: Left Arm, Patient Position: Sitting, Cuff Size: Large)   Pulse (!) 143   Ht 5\' 3"  (1.6 m)   Wt 182 lb (82.6 kg)   SpO2 95%   BMI 32.24 kg/m    Wt Readings from Last 3 Encounters:  10/05/23 182 lb (82.6 kg)  10/05/23 182 lb 9.6 oz (82.8 kg)  12/16/22 175 lb 3.2 oz (79.5 kg)    GEN: Well nourished, well developed in no acute distress NECK: No JVD; No carotid bruits CARDIAC: IR IR, no murmurs, rubs, gallops RESPIRATORY:  Clear to auscultation without rales, wheezing or rhonchi  ABDOMEN: Soft, non-tender, obese, non-distended EXTREMITIES: Trace pretibial edema; No deformity   ASSESSMENT AND PLAN: .   Paroxysmal atrial fibrillation where she is in atrial fibrillation with RVR currently with a rate of 143 on EKG with a  chronic right bundle branch block.  Earlier today heart rate was in the 90s when she was at Dr. Drucilla Georgis office for GI follow-up.  Unfortunately she is symptomatic to her atrial fibrillation with fatigue, shortness of breath, and peripheral edema.  Previously she had been scheduled for cardioversion but self converted prior to the procedure being completed.  She has also been taking her apixaban  once daily versus twice daily for stroke prophylaxis for CHA2DS2-VASc score of at least 3.  Her metoprolol  succinate she will continue with the 100 mg at night diltiazem  240 mg in the morning and an additional 50 mg of metoprolol  to help with her rate without bottoming out her blood pressure.  Also if she converts her typical baseline is sinus bradycardia.  She has been scheduled for TEE cardioversion later on this week as she has missed her Eliquis  dosing unfortunately.  She denies any bleeding or blood noted in her stool or urine.  She has been scheduled for updated preprocedure labs today.  pSVT which she  continues to report occasional brief palpitations but no persistent symptoms.  Currently she is back in atrial fibrillation and her biggest symptom is fatigue and occasional shortness of breath when doing things for her son and some slight edema.  She is continued on current medical therapy with Cardizem  240 mg daily and Toprol -XL 100 mg daily with an additional 50 mg daily to help with better rate control.  Primary hypertension with a blood pressure today of 110/82.  Blood pressure has been stable.  She is continued on diltiazem , losartan  50 mg daily and her metoprolol .  She has been encouraged to continue to monitor her blood pressure 1 to 2 hours postmedication administration at home as well.  Vitamin D deficiency where she was told that her vitamin D levels were critical and she was started on vitamin D by her PCP for 2000 units once a week.  Ongoing management per PCP.    Informed Consent   Shared Decision  Making/Informed Consent   The risks [stroke, cardiac arrhythmias rarely resulting in the need for a temporary or permanent pacemaker, skin irritation or burns, esophageal damage, perforation (1:10,000 risk), bleeding, pharyngeal hematoma as well as other potential complications associated with conscious sedation including aspiration, arrhythmia, respiratory failure and death], benefits (treatment guidance, restoration of normal sinus rhythm, diagnostic support) and alternatives of a transesophageal echocardiogram guided cardioversion were discussed in detail with Julia Martinez and she is willing to proceed.     Dispo: Patient to return to clinic to see MD/APP in 2 to 3 weeks postprocedure or sooner if needed for reevaluation.  Signed, Aaliya Maultsby, NP

## 2023-10-05 NOTE — Telephone Encounter (Signed)
 Patient c/o Palpitations: STAT if patient c/o lightheadedness, shortness of breath, or chest pain  How long have you had palpitations/irregular HR/ Afib? Are you having the symptoms now? Midnight Saturday morning  Are you currently experiencing lightheadedness, SOB or CP? Lightheadedness   Do you have a history of afib (atrial fibrillation) or irregular heart rhythm? yes  Have you checked your BP or HR? (document readings if available): 107/70  Are you experiencing any other symptoms? Tiredness, weakness

## 2023-10-05 NOTE — Telephone Encounter (Signed)
 Patient will be seen in the office today with NP

## 2023-10-05 NOTE — Patient Instructions (Addendum)
 It was good to see you again today!  You continue to be in a nice remission.  For now or continuing Humira   Please take your vitamin D calcium supplement every day  I have reviewed your labs from [facility].  We need to get a CRP between now and the end of the month as that was not included.  Further recommendations to follow.

## 2023-10-06 ENCOUNTER — Ambulatory Visit: Payer: Self-pay

## 2023-10-06 ENCOUNTER — Other Ambulatory Visit (HOSPITAL_COMMUNITY)
Admission: RE | Admit: 2023-10-06 | Discharge: 2023-10-06 | Disposition: A | Discharge status: Home / Self Care | Source: Ambulatory Visit | Attending: Cardiology | Admitting: Cardiology

## 2023-10-06 ENCOUNTER — Telehealth: Payer: Self-pay

## 2023-10-06 DIAGNOSIS — Z01818 Encounter for other preprocedural examination: Secondary | ICD-10-CM | POA: Insufficient documentation

## 2023-10-06 DIAGNOSIS — Z79899 Other long term (current) drug therapy: Secondary | ICD-10-CM

## 2023-10-06 LAB — CBC
HCT: 46.9 % — ABNORMAL HIGH (ref 36.0–46.0)
Hemoglobin: 15.5 g/dL — ABNORMAL HIGH (ref 12.0–15.0)
MCH: 30.8 pg (ref 26.0–34.0)
MCHC: 33 g/dL (ref 30.0–36.0)
MCV: 93.2 fL (ref 80.0–100.0)
Platelets: 275 10*3/uL (ref 150–400)
RBC: 5.03 MIL/uL (ref 3.87–5.11)
RDW: 14 % (ref 11.5–15.5)
WBC: 10.9 10*3/uL — ABNORMAL HIGH (ref 4.0–10.5)
nRBC: 0 % (ref 0.0–0.2)

## 2023-10-06 LAB — BASIC METABOLIC PANEL WITH GFR
Anion gap: 9 (ref 5–15)
BUN: 26 mg/dL — ABNORMAL HIGH (ref 8–23)
CO2: 24 mmol/L (ref 22–32)
Calcium: 9.7 mg/dL (ref 8.9–10.3)
Chloride: 101 mmol/L (ref 98–111)
Creatinine, Ser: 1.16 mg/dL — ABNORMAL HIGH (ref 0.44–1.00)
GFR, Estimated: 50 mL/min — ABNORMAL LOW (ref >60)
Glucose, Bld: 135 mg/dL — ABNORMAL HIGH (ref 70–99)
Potassium: 3.9 mmol/L (ref 3.5–5.1)
Sodium: 134 mmol/L — ABNORMAL LOW (ref 135–145)

## 2023-10-06 NOTE — Telephone Encounter (Signed)
 I spoke with patient and informed her of TEE/Cardioversion on Friday 10/09/23 at 1 pm with Dr.Branch  She has pre-op on Thursday, May 22/25 at 10 am  She is getting lab work today.

## 2023-10-08 ENCOUNTER — Encounter (HOSPITAL_COMMUNITY)
Admission: RE | Admit: 2023-10-08 | Discharge: 2023-10-08 | Disposition: A | Discharge status: Home / Self Care | Source: Ambulatory Visit | Attending: Cardiology | Admitting: Cardiology

## 2023-10-09 ENCOUNTER — Encounter (HOSPITAL_COMMUNITY)
Admission: RE | Disposition: A | Discharge status: Home / Self Care | Payer: Self-pay | Source: Home / Self Care | Attending: Cardiology

## 2023-10-09 ENCOUNTER — Ambulatory Visit (HOSPITAL_COMMUNITY)
Admission: RE | Admit: 2023-10-09 | Discharge: 2023-10-09 | Disposition: A | Discharge status: Home / Self Care | Attending: Cardiology | Admitting: Cardiology

## 2023-10-09 ENCOUNTER — Ambulatory Visit (HOSPITAL_COMMUNITY): Admitting: Anesthesiology

## 2023-10-09 ENCOUNTER — Ambulatory Visit (HOSPITAL_COMMUNITY)

## 2023-10-09 DIAGNOSIS — I4891 Unspecified atrial fibrillation: Secondary | ICD-10-CM | POA: Diagnosis not present

## 2023-10-09 DIAGNOSIS — I361 Nonrheumatic tricuspid (valve) insufficiency: Secondary | ICD-10-CM

## 2023-10-09 DIAGNOSIS — I10 Essential (primary) hypertension: Secondary | ICD-10-CM

## 2023-10-09 DIAGNOSIS — Z6832 Body mass index (BMI) 32.0-32.9, adult: Secondary | ICD-10-CM | POA: Diagnosis not present

## 2023-10-09 DIAGNOSIS — Z79899 Other long term (current) drug therapy: Secondary | ICD-10-CM | POA: Diagnosis not present

## 2023-10-09 DIAGNOSIS — I4819 Other persistent atrial fibrillation: Secondary | ICD-10-CM | POA: Insufficient documentation

## 2023-10-09 DIAGNOSIS — I4510 Unspecified right bundle-branch block: Secondary | ICD-10-CM | POA: Diagnosis not present

## 2023-10-09 DIAGNOSIS — G4733 Obstructive sleep apnea (adult) (pediatric): Secondary | ICD-10-CM | POA: Diagnosis not present

## 2023-10-09 DIAGNOSIS — E559 Vitamin D deficiency, unspecified: Secondary | ICD-10-CM | POA: Diagnosis not present

## 2023-10-09 DIAGNOSIS — I471 Supraventricular tachycardia, unspecified: Secondary | ICD-10-CM | POA: Insufficient documentation

## 2023-10-09 DIAGNOSIS — E669 Obesity, unspecified: Secondary | ICD-10-CM | POA: Diagnosis not present

## 2023-10-09 DIAGNOSIS — K219 Gastro-esophageal reflux disease without esophagitis: Secondary | ICD-10-CM | POA: Diagnosis not present

## 2023-10-09 DIAGNOSIS — I340 Nonrheumatic mitral (valve) insufficiency: Secondary | ICD-10-CM | POA: Diagnosis not present

## 2023-10-09 DIAGNOSIS — I083 Combined rheumatic disorders of mitral, aortic and tricuspid valves: Secondary | ICD-10-CM | POA: Diagnosis not present

## 2023-10-09 DIAGNOSIS — G4730 Sleep apnea, unspecified: Secondary | ICD-10-CM | POA: Diagnosis not present

## 2023-10-09 DIAGNOSIS — Z7901 Long term (current) use of anticoagulants: Secondary | ICD-10-CM | POA: Insufficient documentation

## 2023-10-09 DIAGNOSIS — F32A Depression, unspecified: Secondary | ICD-10-CM

## 2023-10-09 HISTORY — PX: CARDIOVERSION: SHX1299

## 2023-10-09 HISTORY — PX: TEE WITHOUT CARDIOVERSION: SHX5443

## 2023-10-09 LAB — ECHO TEE

## 2023-10-09 SURGERY — CARDIOVERSION
Anesthesia: General

## 2023-10-09 MED ORDER — ORAL CARE MOUTH RINSE: 15.0000 mL | Freq: Once | OROMUCOSAL | Status: DC

## 2023-10-09 MED ORDER — SODIUM CHLORIDE 0.9% FLUSH: 3.0000 mL | Freq: Two times a day (BID) | INTRAVENOUS | Status: DC

## 2023-10-09 MED ORDER — BUTAMBEN-TETRACAINE-BENZOCAINE 2-2-14 % EX AERO
INHALATION_SPRAY | CUTANEOUS | Status: DC | PRN
  Administered 2023-10-09: 2 via TOPICAL

## 2023-10-09 MED ORDER — SODIUM CHLORIDE 0.9% FLUSH
3.0000 mL | INTRAVENOUS | Status: DC | PRN
Start: 2023-10-09 — End: 2023-10-09

## 2023-10-09 MED ORDER — PROPOFOL 10 MG/ML IV BOLUS
INTRAVENOUS | Status: DC | PRN
  Administered 2023-10-09: 50 ug/kg/min via INTRAVENOUS
  Administered 2023-10-09: 40 mg via INTRAVENOUS
  Administered 2023-10-09: 100 mg via INTRAVENOUS
  Administered 2023-10-09: 20 mg via INTRAVENOUS

## 2023-10-09 MED ORDER — PROPOFOL 500 MG/50ML IV EMUL
INTRAVENOUS | Status: AC
  Filled 2023-10-09: qty 50

## 2023-10-09 MED ORDER — BUTAMBEN-TETRACAINE-BENZOCAINE 2-2-14 % EX AERO
INHALATION_SPRAY | CUTANEOUS | Status: AC
  Filled 2023-10-09: qty 5

## 2023-10-09 MED ORDER — LACTATED RINGERS IV SOLN: INTRAVENOUS | Status: DC

## 2023-10-09 MED ORDER — CHLORHEXIDINE GLUCONATE 0.12 % MT SOLN: 15.0000 mL | Freq: Once | OROMUCOSAL | Status: DC

## 2023-10-09 NOTE — Transfer of Care (Signed)
 Immediate Anesthesia Transfer of Care Note  Patient: Julia Martinez  Procedure(s) Performed: CARDIOVERSION ECHOCARDIOGRAM, TRANSESOPHAGEAL  Patient Location: PACU  Anesthesia Type:General  Level of Consciousness: drowsy and patient cooperative  Airway & Oxygen  Therapy: Patient Spontanous Breathing and Patient connected to nasal cannula oxygen   Post-op Assessment: Report given to RN and Post -op Vital signs reviewed and stable  Post vital signs: Reviewed and stable  Last Vitals:  Vitals Value Taken Time  BP  10/09/2023  134492/75  Temp 97.5 10/09/2023  1343  Pulse 60 10/09/2023  1343   Resp 30 10/09/2023  1343  SpO2 98 10/09/2023  1343    Last Pain:  Vitals:   10/09/23 1155  TempSrc: Oral  PainSc: 0-No pain         Complications: No notable events documented.

## 2023-10-09 NOTE — H&P (Signed)
 Procedure H&P Note   Patient presents for outpatient transesophageal echocradiogram and electrical cardioversion for atrial fibrillation. She is on eliquis  at home but has not been taking regularly and thus TEE is indicated prior to cardioversion. For full medical history please see referenced clinic note below. Plan for TEE/electrical cardioversion today with the assistance of anesthesiology.  Armida Lander MD      Cardiology Office Note:  .   Date:  10/05/2023  ID:  Julia Martinez, DOB 1951/02/19, MRN [id number] PCP: Lorre Rosin, NP  [facility] Providers Cardiologist:  Teddie Favre, MD    History of Present Illness: .   Julia Martinez is a 73 y.o. female with a past medical history of paroxysmal atrial fibrillation, primary pretension, PSVT, GERD, who presents today being seen as a work in for increased complaints of fatigue.   Longstanding history of palpitations with echocardiogram completed in April 2023.  LVEF 60 to 65%, no RWMA, G1 DD, right ventricular systolic function was low normal and ventricular size is moderately enlarged, moderately elevated pulmonary artery systolic pressures, small pericardial effusion posterior to the left ventricle, mild MR.   She was last seen in clinic 12/06/2022 by B Strader, PA-C.  At that time she states she been under increased stress over the past few months and has not been hospitalized twice.  She 6.  Occasional palpitations.  Spontaneously resolves within a few minutes.  No persistent fatigue resembling her prior atrial fibrillation.  She remains on Eliquis  for anticoagulation and reported no bleeding with no blood noted in her urine or stool.  She was continued on Cardizem  240 mg daily Toprol -XL 100 mg daily for rate control.  As well as her apixaban  5 mg twice daily for OAC.  There were no further medication changes that were made testing that was needed to be ordered at that time.   She returns to clinic today with  worsening fatigue, abdominal bloating, and edema.  She states that this morning she followed up with Dr. Riley Cheadle from GI for her annual Crohn's visit and her heart rate was 97.  On arrival to clinic today she is worked up and anxious and her heart rate is in the 140s.  EKG today reveals that she is back in atrial fibrillation with RVR.  Upon resting,  patient's heart rate did come down with sitting but only into the 90s.  Unfortunately she states that she has not taken her anticoagulation twice daily as prescribed she typically only takes it once a day.  She states that she has been taking her Toprol -XL 100 mg in the evening and diltiazem  240 mg daily and her additional metoprolol  25 mg in the morning.  She stated that she had noticed worsening fatigue that started on Friday of last week.  She also noted early satiety with abdominal bloating.  She was certain that she was back in A-fib as her fatigue had worsened since Friday.  She does not feel her elevated heart rate.  She denies any chest pain but occasionally has shortness of breath and peripheral edema.  She denies any recent hospitalizations or visits to the emergency department.  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   ROS: 10 point review of systems has been reviewed and considered negative with exception was been listed in the HPI   Studies Reviewed: Aaron Aas   EKG Interpretation Date/Time:  Monday Oct 05 2023 15:58:17 EDT Ventricular Rate:         144 PR Interval:                   QRS Duration:             138 QT Interval:                 338 QTC Calculation:523 R Axis:                         116   Text Interpretation:Atrial fibrillation with rvr Right axis deviation Non-specific intra-ventricular conduction block Right ventricular hypertrophy Cannot rule out Septal infarct (cited on or before 22-Oct-2022) T wave abnormality, consider inferior ischemia T wave  abnormality, consider anterolateral ischemia When compared with ECG of 22-Oct-2022 13:03, Confirmed by Ronald Cockayne ([id number]) on 10/05/2023 4:35:44 PM     Echocardiogram: 08/2021 IMPRESSIONS   1. Left ventricular ejection fraction, by estimation, is 60 to 65%. The  left ventricle has normal function. The left ventricle has no regional  wall motion abnormalities. There is mild left ventricular hypertrophy.  Left ventricular diastolic parameters  are consistent with Grade I diastolic dysfunction (impaired relaxation).   2. Right ventricular systolic function is low normal. The right  ventricular size is moderately enlarged. There is moderately elevated  pulmonary artery systolic pressure. The estimated right ventricular  systolic pressure is 47.6 mmHg.   3. Left atrial size was moderately dilated.   4. Right atrial size was upper normal.   5. A small pericardial effusion is present. The pericardial effusion is  posterior to the left ventricle.   6. The mitral valve is degenerative. Mild mitral valve regurgitation.   7. The aortic valve is tricuspid. Aortic valve regurgitation is not  visualized. Aortic valve mean gradient measures 3.0 mmHg.   8. The inferior vena cava is normal in size with greater than 50%  respiratory variability, suggesting right atrial pressure of 3 mmHg.   Comparison(s): Prior images unable to be directly viewed.    Risk Assessment/Calculations:     CHA2DS2-VASc Score = 3   This indicates a 3.2% annual risk of stroke. The patient's score is based upon: CHF History: 0 HTN History: 1 Diabetes History: 0 Stroke History: 0 Vascular Disease History: 0 Age Score: 1 Gender Score: 1             Physical Exam:   VS:  BP 110/82 (BP Location: Left Arm, Patient Position: Sitting, Cuff Size: Large)   Pulse (!) 143   Ht 5\' 3"  (1.6 m)   Wt 182 lb (82.6 kg)   SpO2 95%   BMI 32.24 kg/m       Wt Readings from Last 3 Encounters:  10/05/23 182 lb (82.6 kg)  10/05/23  182 lb 9.6 oz (82.8 kg)  12/16/22 175 lb 3.2 oz (79.5 kg)    GEN: Well nourished, well developed in no acute distress NECK: No JVD; No carotid bruits CARDIAC: IR IR, no murmurs, rubs, gallops RESPIRATORY:  Clear to auscultation without rales, wheezing or rhonchi  ABDOMEN: Soft, non-tender, obese, non-distended EXTREMITIES: Trace pretibial edema; No deformity    ASSESSMENT AND PLAN: .   Paroxysmal atrial fibrillation where she is in atrial fibrillation with RVR currently with a rate of 143 on EKG with a chronic right bundle Mishael Haran block.  Earlier today heart rate was in the 90s when she was at Dr. Drucilla Georgis office for  GI follow-up.  Unfortunately she is symptomatic to her atrial fibrillation with fatigue, shortness of breath, and peripheral edema.  Previously she had been scheduled for cardioversion but self converted prior to the procedure being completed.  She has also been taking her apixaban  once daily versus twice daily for stroke prophylaxis for CHA2DS2-VASc score of at least 3.  Her metoprolol  succinate she will continue with the 100 mg at night diltiazem  240 mg in the morning and an additional 50 mg of metoprolol  to help with her rate without bottoming out her blood pressure.  Also if she converts her typical baseline is sinus bradycardia.  She has been scheduled for TEE cardioversion later on this week as she has missed her Eliquis  dosing unfortunately.  She denies any bleeding or blood noted in her stool or urine.  She has been scheduled for updated preprocedure labs today.   pSVT which she continues to report occasional brief palpitations but no persistent symptoms.  Currently she is back in atrial fibrillation and her biggest symptom is fatigue and occasional shortness of breath when doing things for her son and some slight edema.  She is continued on current medical therapy with Cardizem  240 mg daily and Toprol -XL 100 mg daily with an additional 50 mg daily to help with better rate control.    Primary hypertension with a blood pressure today of 110/82.  Blood pressure has been stable.  She is continued on diltiazem , losartan  50 mg daily and her metoprolol .  She has been encouraged to continue to monitor her blood pressure 1 to 2 hours postmedication administration at home as well.   Vitamin D deficiency where she was told that her vitamin D levels were critical and she was started on vitamin D by her PCP for 2000 units once a week.  Ongoing management per PCP.    Informed Consent Shared Decision Making/Informed Consent   The risks [stroke, cardiac arrhythmias rarely resulting in the need for a temporary or permanent pacemaker, skin irritation or burns, esophageal damage, perforation (1:10,000 risk), bleeding, pharyngeal hematoma as well as other potential complications associated with conscious sedation including aspiration, arrhythmia, respiratory failure and death], benefits (treatment guidance, restoration of normal sinus rhythm, diagnostic support) and alternatives of a transesophageal echocardiogram guided cardioversion were discussed in detail with Ms. Fujita and she is willing to proceed.      Dispo: Patient to return to clinic to see MD/APP in 2 to 3 weeks postprocedure or sooner if needed for reevaluation.   Signed, SHERI HAMMOCK, NP

## 2023-10-09 NOTE — Progress Notes (Signed)
*  PRELIMINARY RESULTS* Echocardiogram Echocardiogram Transesophageal has been performed with saline bubble study.  Bernis Brisker 10/09/2023, 2:05 PM

## 2023-10-09 NOTE — CV Procedure (Signed)
 CV Procedure Note  Procedure: transesophageal echocardiography/electrical cardioversion Indication: persistent atrial fibrillation Physician: Dr Armida Lander MD   Patient was brought to the procedure suite after appropriate consent was obtained. The posterior oropharyxnx was anesthesized with cetacaine spray. Bite block placed. Sedation achieved with the assistance of anesthesiology, please see their documentation for details. TEE probe intubated into espophagus without difficulty and several views obtained. Please refer to full TEE report for complete findings. No evidence of intracardiac thrombus. TEE probe removed.   Defib pads had been placed in anterior and posterior positions. She was succesfully cardioversted with a single 200j synchronized shock from afib to normal sinus rhythm low 60s. Cardiopulmonary monitoring was performed throughout the procedure, she tolerated well without complications  Postconversion heart rates low 60s, we will lower her toprol  back down to 100mg  daily, continue diltiazem  240mg   Armida Lander MD

## 2023-10-09 NOTE — Anesthesia Preprocedure Evaluation (Addendum)
 Anesthesia Evaluation  Patient identified by MRN, date of birth, ID band Patient awake    Reviewed: Allergy & Precautions, H&P , NPO status , Patient's Chart, lab work & pertinent test results  Airway Mallampati: II  TM Distance: >3 FB Neck ROM: Full    Dental no notable dental hx.    Pulmonary sleep apnea    Pulmonary exam normal breath sounds clear to auscultation       Cardiovascular hypertension, + dysrhythmias Atrial Fibrillation  Rhythm:Irregular Rate:Abnormal  Ef 60-65% 2023   Neuro/Psych  PSYCHIATRIC DISORDERS  Depression    negative neurological ROS     GI/Hepatic Neg liver ROS, PUD,,,  Endo/Other  negative endocrine ROS    Renal/GU negative Renal ROS  negative genitourinary   Musculoskeletal negative musculoskeletal ROS (+)    Abdominal  (+) + obese  Peds negative pediatric ROS (+)  Hematology  (+) Blood dyscrasia, anemia   Anesthesia Other Findings   Reproductive/Obstetrics negative OB ROS                             Anesthesia Physical Anesthesia Plan  ASA: 3  Anesthesia Plan: General   Post-op Pain Management:    Induction:   PONV Risk Score and Plan:   Airway Management Planned: Nasal Cannula  Additional Equipment:   Intra-op Plan:   Post-operative Plan:   Informed Consent: I have reviewed the patients History and Physical, chart, labs and discussed the procedure including the risks, benefits and alternatives for the proposed anesthesia with the patient or authorized representative who has indicated his/her understanding and acceptance.     Dental advisory given  Plan Discussed with: CRNA  Anesthesia Plan Comments:        Anesthesia Quick Evaluation

## 2023-10-09 NOTE — Progress Notes (Signed)
 Electrical Cardioversion Procedure Note LANDA MULLINAX [id number] [birth year]  Procedure: Electrical Cardioversion Indications:  Atrial Fibrillation  Procedure Details Consent: on chart and signed Time Out: Verified patient identification, verified procedure, site/side was marked, verified correct patient position, special equipment/implants available, medications/allergies/relevent history reviewed, required imaging and test 1312  Patient placed on cardiac monitor, pulse oximetry, supplemental oxygen  as necessary.  Sedation given: propofol Pacer pads placed anterior and posterior chest.  Cardioverted 1 time(s).  Cardioverted at 200J.  Evaluation Findings: Post procedure EKG shows: NSR Complications: None Patient did tolerate procedure well.   Alain Howard 10/09/2023, 2:36 PM

## 2023-10-10 NOTE — Anesthesia Postprocedure Evaluation (Signed)
 Anesthesia Post Note  Patient: Julia Martinez  Procedure(s) Performed: CARDIOVERSION ECHOCARDIOGRAM, TRANSESOPHAGEAL  Patient location during evaluation: PACU Anesthesia Type: General Level of consciousness: awake and alert Pain management: pain level controlled Vital Signs Assessment: post-procedure vital signs reviewed and stable Respiratory status: spontaneous breathing, nonlabored ventilation, respiratory function stable and patient connected to nasal cannula oxygen  Cardiovascular status: blood pressure returned to baseline and stable Postop Assessment: no apparent nausea or vomiting Anesthetic complications: no  No notable events documented.   Last Vitals:  Vitals:   10/09/23 1430 10/09/23 1445  BP: 130/68 94/60  Pulse: 66 61  Resp: (!) 23 16  Temp:    SpO2: 95% 97%    Last Pain:  Vitals:   10/09/23 1445  TempSrc:   PainSc: 0-No pain                 Beacher Limerick

## 2023-10-11 ENCOUNTER — Encounter (HOSPITAL_COMMUNITY): Payer: Self-pay | Admitting: Cardiology

## 2023-10-20 NOTE — Progress Notes (Unsigned)
 Cardiology Office Note    Date:  10/22/2023  ID:  Julia Martinez, DOB [birth year], MRN [id number] Cardiologist: Teddie Favre, MD    History of Present Illness:    Julia Martinez is a 73 y.o. female with past medical history of paroxysmal atrial fibrillation, HTN, PSVT and GERD who presents to the office today for follow-up from her recent TEE/DCCV.  She was examined by Ronald Cockayne, NP on 10/05/2023 and reported worsening fatigue, abdominal distention and edema. She had been evaluated by GI earlier in the day and heart rate had been elevated and was in the 140's at the time of her office visit. Was only taking Eliquis  once daily but had been compliant with Toprol -XL and Cardizem  CD. It was recommended to proceed with a cardioversion with TEE due to missing doses of Eliquis . This was performed by Dr. Amanda Jungling on 10/09/2023. TEE showed no evidence of intracardiac thrombus and DCCV was performed and she converted back to normal sinus rhythm with a single synchronized 200 J shock. Toprol -XL was reduced to her prior dose of 100 mg daily given HR in the 60's when in NSR and she was continued on Cardizem  CD 240 mg daily.  In talking with the patient today, she reports overall feeling well following her recent cardioversion. Reports occasional, brief palpitations lasting for a few seconds but no persistent symptoms. Heart rate has overall been well-controlled when checked at home.  Breathing has been stable and no recent orthopnea, PND or pitting edema. Reports compliance with Eliquis  and has not missed any doses. She has been taking this twice daily.  She continues to be under increased stress as she is the primary caregiver for her son who has Prader-Willi syndrome.   Studies Reviewed:   EKG: EKG is ordered today and demonstrates:   EKG Interpretation Date/Time:  Thursday October 22 2023 11:25:54 EDT Ventricular Rate:  62 PR Interval:  164 QRS Duration:  136 QT Interval:  406 QTC Calculation: 412 R  Axis:   93  Text Interpretation: Normal sinus rhythm Right bundle branch block Confirmed by Woodfin Hays ([id number]) on 10/22/2023 11:38:57 AM       TEE: 09/2023 IMPRESSIONS     1. Left ventricular ejection fraction, by estimation, is 60 to 65%. The  left ventricle has normal function.   2. Right ventricular systolic function is low normal. The right  ventricular size is mildly enlarged.   3. Left atrial size was severely dilated. No left atrial/left atrial  appendage thrombus was detected. The LAA emptying velocity was 62 cm/s.   4. The mitral valve is abnormal. Mild mitral valve regurgitation. No  evidence of mitral stenosis.   5. The tricuspid valve is abnormal. Tricuspid valve regurgitation is  moderate.   6. The aortic valve is tricuspid. There is mild calcification of the  aortic valve. There is mild thickening of the aortic valve. Aortic valve  regurgitation is not visualized. No aortic stenosis is present.   7. Agitated saline contrast bubble study was negative, with no evidence  of any interatrial shunt.No evidence of intracardiac thrombus.   Risk Assessment/Calculations:   CHA2DS2-VASc Score = 3   This indicates a 3.2% annual risk of stroke. The patient's score is based upon: CHF History: 0 HTN History: 1 Diabetes History: 0 Stroke History: 0 Vascular Disease History: 0 Age Score: 1 Gender Score: 1    Physical Exam:   VS:  BP 138/88 (BP Location: Right Arm)   Pulse 62   Ht  5\' 3"  (1.6 m)   Wt 184 lb (83.5 kg)   SpO2 95%   BMI 32.59 kg/m    Wt Readings from Last 3 Encounters:  10/22/23 184 lb (83.5 kg)  10/05/23 182 lb (82.6 kg)  10/05/23 182 lb 9.6 oz (82.8 kg)     GEN: Well nourished, well developed female appearing in no acute distress NECK: No JVD; No carotid bruits CARDIAC: RRR, no murmurs, rubs, gallops RESPIRATORY:  Clear to auscultation without rales, wheezing or rhonchi  ABDOMEN: Appears non-distended. No obvious abdominal  masses. EXTREMITIES: No clubbing or cyanosis. No pitting edema.  Distal pedal pulses are 2+ bilaterally.   Assessment and Plan:   1. Persistent Atrial Fibrillation/Use of Long-term Anticoagulation - Recently underwent successful TEE/DCCV due to persistent atrial fibrillation with RVR.  Maintaining normal sinus rhythm by examination and EKG today. Continue Cardizem  CD 240 mg daily and Toprol -XL 100 mg daily. We reviewed that if she continues to have more frequent episodes, may need to consider referral to EP for consideration of antiarrhythmic medications or ablation. - Continue Eliquis  5 mg twice daily for anticoagulation which is the appropriate dose given her age, weight and renal function. CBC last month showed her hemoglobin was stable at 15.5 with platelets at 275 K.  2. pSVT - Reports occasional, brief palpitations but no persistent symptoms. Continue Cardizem  CD 240 mg daily and Toprol -XL 100 mg daily.  3. HTN - BP is at 138/88 during today's visit. She was listed as not taking Losartan  but confirmed with the patient she has remained on this. Continue current medical therapy with Cardizem  CD 240 mg daily, Losartan  50 mg daily and Toprol -XL 100 mg daily. If BP remains above goal, Losartan  can be further titrated. As discussed above, she has been under increased stress this morning and suspect this is contributing.  4. Anxiety/Depression - Reports having frequent anxiety and depression and feels like this has overall been contributing to her medical issues. As discussed above, she is the primary caregiver for her son and her husband passed away unexpectedly last year. No reported SI/HI. Will reach out to our H&V Social Worker to see if they have a list of mental health providers as she is open to being referred (prefers [facility] if available or telehealth options).   Disposition: She has previously scheduled follow-up with Dr. Londa Rival on 12/15/2023 and wishes to keep that appointment  for now.  Signed, Dorma Gash, PA-C

## 2023-10-22 ENCOUNTER — Ambulatory Visit: Attending: Student | Admitting: Student

## 2023-10-22 ENCOUNTER — Encounter: Payer: Self-pay | Admitting: Student

## 2023-10-22 VITALS — BP 138/88 | HR 62 | Ht 63.0 in | Wt 184.0 lb

## 2023-10-22 DIAGNOSIS — I4819 Other persistent atrial fibrillation: Secondary | ICD-10-CM | POA: Diagnosis not present

## 2023-10-22 DIAGNOSIS — F419 Anxiety disorder, unspecified: Secondary | ICD-10-CM

## 2023-10-22 DIAGNOSIS — I471 Supraventricular tachycardia, unspecified: Secondary | ICD-10-CM

## 2023-10-22 DIAGNOSIS — Z7901 Long term (current) use of anticoagulants: Secondary | ICD-10-CM

## 2023-10-22 DIAGNOSIS — I10 Essential (primary) hypertension: Secondary | ICD-10-CM | POA: Diagnosis not present

## 2023-10-22 DIAGNOSIS — F341 Dysthymic disorder: Secondary | ICD-10-CM

## 2023-10-22 MED ORDER — METOPROLOL SUCCINATE ER 100 MG PO TB24: 100.0000 mg | ORAL_TABLET | Freq: Every day | ORAL | 3 refills | Status: AC

## 2023-10-22 MED ORDER — APIXABAN 5 MG PO TABS: 5.0000 mg | ORAL_TABLET | Freq: Two times a day (BID) | ORAL | 11 refills | Status: AC

## 2023-10-22 MED ORDER — DILTIAZEM HCL ER COATED BEADS 240 MG PO CP24: ORAL_CAPSULE | ORAL | 3 refills | Status: AC

## 2023-10-22 MED ORDER — LOSARTAN POTASSIUM 50 MG PO TABS: 50.0000 mg | ORAL_TABLET | Freq: Every day | ORAL | 3 refills | Status: AC

## 2023-10-22 NOTE — Addendum Note (Signed)
 Addended by: Dorma Gash on: 10/22/2023 03:10 PM   Modules accepted: Orders

## 2023-10-22 NOTE — Patient Instructions (Signed)
 Medication Instructions:  Your physician recommends that you continue on your current medications as directed. Please refer to the Current Medication list given to you today.  *If you need a refill on your cardiac medications before your next appointment, please call your pharmacy*  Lab Work: NONE   If you have labs (blood work) drawn today and your tests are completely normal, you will receive your results only by: MyChart Message (if you have MyChart) OR A paper copy in the mail If you have any lab test that is abnormal or we need to change your treatment, we will call you to review the results.  Testing/Procedures: NONE   Follow-Up: At [facility], you and your health needs are our priority.  As part of our continuing mission to provide you with exceptional heart care, our providers are all part of one team.  This team includes your primary Cardiologist (physician) and Advanced Practice Providers or APPs (Physician Assistants and Nurse Practitioners) who all work together to provide you with the care you need, when you need it.  Your next appointment:    December 15, 2023  Provider:   You may see Teddie Favre, MD or one of the following Advanced Practice Providers on your designated Care Team:   Woodfin Hays, PA-C  [place], [place] Theotis Flake, [place]     We recommend signing up for the patient portal called "MyChart".  Sign up information is provided on this After Visit Summary.  MyChart is used to connect with patients for Virtual Visits (Telemedicine).  Patients are able to view lab/test results, encounter notes, upcoming appointments, etc.  Non-urgent messages can be sent to your provider as well.   To learn more about what you can do with MyChart, go to [email or web address].   Other Instructions Thank you for choosing [facility]!

## 2023-10-23 ENCOUNTER — Telehealth: Payer: Self-pay | Admitting: Licensed Clinical Social Worker

## 2023-10-23 NOTE — Progress Notes (Addendum)
 Heart and Vascular Care Navigation  10/23/2023  NOHEMI NICKLAUS 11-19-1950 [id number]  Reason for Referral: mental health community resources Patient is participating in a Managed Medicaid Plan:No, [facility] Medicare  Engaged with patient by telephone for initial visit for Heart and Vascular Care Coordination.                                                                                                   Assessment:    LCSW was able to reach pt at [phone]. Re-introduced self, role, reason for call. Pt confirmed this is a better time and confirmed home address, PCP, and current insurance coverage. She shares no issues with costs of living, medications or food. Has consistent transportation. 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 She shares she did grief counseling through hospice but felt like all they did was tell her to "do a bunch of workbooks." And she feels as though she is currently ready to speak to someone to help her again with feelings of grief and stress. She is aware provider has made referral to [facility] practice in [place] with [facility] but that they may have a waitlist. Therefore we discussed emergency resources and that I will send her information about additional ways to connect with mental health support. Pt open to in person and telehealth which will expand her options. Will need to contact [facility] Medicare and providers on list to ensure it is either in network or cost appropriate for pt at this time. No additional questions today- will f/u to ensure pt has received resources and encouraged her to call me as needed.                                     HRT/VAS Care Coordination     Patients Home Cardiology Office [facility]   Outpatient Care Team Social Worker   Social Worker Name: Nathen Balder, [place], [phone]   Living arrangements for the past 2 months Single Family Home   Lives with: Adult  Children   Patient Current Insurance Coverage Managed Medicare   Patient Has Concern With Paying Medical Bills No   Does Patient Have Prescription Coverage? Yes   Home Assistive Devices/Equipment None       Social History:                                                                             SDOH Screenings   Food Insecurity: No Food Insecurity (10/23/2023)  Housing: Low Risk  (10/23/2023)  Transportation Needs: No Transportation Needs (08/10/2023)   Received from [facility]  Utilities: Not At Risk (10/23/2023)  Depression (PHQ2-9): Low Risk  (06/18/2023)  Financial  Resource Strain: Low Risk  (10/23/2023)  Physical Activity: Unknown (08/10/2023)   Received from [facility]  Social Connections: Somewhat Isolated (08/10/2023)   Received from [facility]  Stress: Stress Concern Present (10/23/2023)  Tobacco Use: Low Risk  (10/22/2023)  Health Literacy: Adequate Health Literacy (10/23/2023)    SDOH Interventions: Financial Resources:  Financial Strain Interventions: Intervention Not Indicated  Food Insecurity:  Food Insecurity Interventions: Intervention Not Indicated  Housing Insecurity:  Housing Interventions: Intervention Not Indicated  Transportation:    No transportation concerns noted    Other Care Navigation Interventions:     Provided Pharmacy assistance resources  Pt denied any issues obtaining or affording medications at this time  Patient expressed Mental Health concerns Yes, Referred to:  provided counseling list, crisis resources and list from [facility] Medicare website   Follow-up plan:   LCSW mailed pt resources for mental health providers (HRT/VAS list and [facility] medicare provider list), as well as discussed crisis resources. Pt will review options and give me a call back. Aware referral placed to [facility] clinic, has been sent my card as well. Will f/u if I do not hear from her to discuss further how to use list and connect with therapy provider.

## 2023-10-23 NOTE — Telephone Encounter (Signed)
 H&V Care Navigation CSW Progress Note  Clinical Social Worker contacted patient by phone to f/u on referral for community resources including mental health supports. Per provider no SI/HI just trying to manage significant life stressors. LCSW was able to reach pt today at [phone], she shares she just left a graduation and is having lunch at a loud restaurant. Offered to call back later this afternoon. Pt agreeable will f/u at that time.  Patient is participating in a Managed Medicaid Plan:  No, Medicare a and b and [place]  SDOH Screenings   Food Insecurity: No Food Insecurity (08/10/2023)   Received from [facility]  Housing: Low Risk  (08/10/2023)   Received from [facility]  Transportation Needs: No Transportation Needs (08/10/2023)   Received from [facility]  Utilities: Not At Risk (08/10/2023)   Received from [facility]  Depression (PHQ2-9): Low Risk  (06/18/2023)  Financial Resource Strain: Low Risk  (08/10/2023)   Received from [facility]  Physical Activity: Unknown (08/10/2023)   Received from [facility]  Social Connections: Somewhat Isolated (08/10/2023)   Received from [facility]  Stress: Stress Concern Present (08/10/2023)   Received from [facility]  Tobacco Use: Low Risk  (10/22/2023)   Nathen Balder, MSW, LCSW Clinical Social Worker II [facility] Heart/Vascular Care Navigation  [phone]- work cell phone (preferred)

## 2023-11-17 ENCOUNTER — Telehealth: Payer: Self-pay | Admitting: Licensed Clinical Social Worker

## 2023-11-17 NOTE — Telephone Encounter (Signed)
 H&V Care Navigation CSW Progress Note  Clinical Social Worker contacted patient by phone to f/u on mental health resources. Pt reached at [phone]. Confirmed they were received, she has been reviewing them, hasn't established with anyone at this time. We also discussed [facility] referral through [facility] program for some additional supportive therapies. Pt agreeable, this was made through [facility]. Will f/u one more time to ensure pt has what she needs/feels comfortable engaging with resources further.  Patient is participating in a Managed Medicaid Plan:  No, Medicare and supplement  SDOH Screenings   Food Insecurity: No Food Insecurity (10/23/2023)  Housing: Low Risk  (10/23/2023)  Transportation Needs: No Transportation Needs (10/23/2023)  Utilities: Not At Risk (10/23/2023)  Depression (PHQ2-9): Low Risk  (06/18/2023)  Financial Resource Strain: Low Risk  (10/23/2023)  Physical Activity: Unknown (08/10/2023)   Received from [facility]  Social Connections: Somewhat Isolated (08/10/2023)   Received from [facility]  Stress: Stress Concern Present (10/23/2023)  Tobacco Use: Low Risk  (10/22/2023)  Health Literacy: Adequate Health Literacy (10/23/2023)    Marit Lark, MSW, LCSW Clinical Social Worker II [facility] Heart/Vascular Care Navigation  [phone]- work cell phone (preferred)

## 2023-11-30 ENCOUNTER — Encounter (HOSPITAL_COMMUNITY): Payer: Self-pay

## 2023-11-30 ENCOUNTER — Emergency Department (HOSPITAL_COMMUNITY)
Admission: EM | Admit: 2023-11-30 | Discharge: 2023-11-30 | Disposition: A | Discharge status: Home / Self Care | Source: Ambulatory Visit | Attending: Emergency Medicine | Admitting: Emergency Medicine

## 2023-11-30 ENCOUNTER — Ambulatory Visit: Attending: Nurse Practitioner | Admitting: Nurse Practitioner

## 2023-11-30 ENCOUNTER — Telehealth: Payer: Self-pay

## 2023-11-30 ENCOUNTER — Other Ambulatory Visit: Payer: Self-pay

## 2023-11-30 ENCOUNTER — Encounter: Payer: Self-pay | Admitting: Nurse Practitioner

## 2023-11-30 VITALS — BP 116/70 | HR 150 | Ht 63.0 in | Wt 181.2 lb

## 2023-11-30 DIAGNOSIS — I4891 Unspecified atrial fibrillation: Secondary | ICD-10-CM | POA: Diagnosis not present

## 2023-11-30 DIAGNOSIS — I10 Essential (primary) hypertension: Secondary | ICD-10-CM | POA: Diagnosis not present

## 2023-11-30 DIAGNOSIS — Z7901 Long term (current) use of anticoagulants: Secondary | ICD-10-CM | POA: Diagnosis not present

## 2023-11-30 DIAGNOSIS — Z79899 Other long term (current) drug therapy: Secondary | ICD-10-CM | POA: Insufficient documentation

## 2023-11-30 DIAGNOSIS — R7989 Other specified abnormal findings of blood chemistry: Secondary | ICD-10-CM | POA: Diagnosis not present

## 2023-11-30 DIAGNOSIS — R002 Palpitations: Secondary | ICD-10-CM | POA: Diagnosis present

## 2023-11-30 DIAGNOSIS — D72829 Elevated white blood cell count, unspecified: Secondary | ICD-10-CM | POA: Insufficient documentation

## 2023-11-30 HISTORY — DX: Unspecified atrial fibrillation: I48.91

## 2023-11-30 LAB — CBC WITH DIFFERENTIAL/PLATELET
Abs Immature Granulocytes: 0.04 K/uL (ref 0.00–0.07)
Basophils Absolute: 0.1 K/uL (ref 0.0–0.1)
Basophils Relative: 1 %
Eosinophils Absolute: 0.1 K/uL (ref 0.0–0.5)
Eosinophils Relative: 1 %
HCT: 47.1 % — ABNORMAL HIGH (ref 36.0–46.0)
Hemoglobin: 15.7 g/dL — ABNORMAL HIGH (ref 12.0–15.0)
Immature Granulocytes: 0 %
Lymphocytes Relative: 23 %
Lymphs Abs: 2.6 K/uL (ref 0.7–4.0)
MCH: 30.8 pg (ref 26.0–34.0)
MCHC: 33.3 g/dL (ref 30.0–36.0)
MCV: 92.5 fL (ref 80.0–100.0)
Monocytes Absolute: 0.9 K/uL (ref 0.1–1.0)
Monocytes Relative: 8 %
Neutro Abs: 7.7 K/uL (ref 1.7–7.7)
Neutrophils Relative %: 67 %
Platelets: 312 K/uL (ref 150–400)
RBC: 5.09 MIL/uL (ref 3.87–5.11)
RDW: 13.2 % (ref 11.5–15.5)
WBC: 11.4 K/uL — ABNORMAL HIGH (ref 4.0–10.5)
nRBC: 0 % (ref 0.0–0.2)

## 2023-11-30 LAB — BASIC METABOLIC PANEL WITH GFR
Anion gap: 14 (ref 5–15)
BUN: 21 mg/dL (ref 8–23)
CO2: 24 mmol/L (ref 22–32)
Calcium: 11.2 mg/dL — ABNORMAL HIGH (ref 8.9–10.3)
Chloride: 102 mmol/L (ref 98–111)
Creatinine, Ser: 1.25 mg/dL — ABNORMAL HIGH (ref 0.44–1.00)
GFR, Estimated: 46 mL/min — ABNORMAL LOW (ref >60)
Glucose, Bld: 164 mg/dL — ABNORMAL HIGH (ref 70–99)
Potassium: 3.8 mmol/L (ref 3.5–5.1)
Sodium: 140 mmol/L (ref 135–145)

## 2023-11-30 LAB — MAGNESIUM: Magnesium: 1.9 mg/dL (ref 1.7–2.4)

## 2023-11-30 LAB — TROPONIN I (HIGH SENSITIVITY): Troponin I (High Sensitivity): 5 ng/L (ref <18)

## 2023-11-30 MED ORDER — LACTATED RINGERS IV BOLUS
500.0000 mL | Freq: Once | INTRAVENOUS | Status: AC
  Administered 2023-11-30: 500 mL via INTRAVENOUS

## 2023-11-30 MED ORDER — ONDANSETRON HCL 4 MG/2ML IJ SOLN
4.0000 mg | Freq: Once | INTRAMUSCULAR | Status: AC
  Administered 2023-11-30: 4 mg via INTRAVENOUS
  Filled 2023-11-30: qty 2

## 2023-11-30 MED ORDER — ETOMIDATE 2 MG/ML IV SOLN
0.1000 mg/kg | Freq: Once | INTRAVENOUS | Status: AC
  Administered 2023-11-30: 8.22 mg via INTRAVENOUS
  Filled 2023-11-30: qty 10

## 2023-11-30 NOTE — Progress Notes (Signed)
 This NP has tried several times calling [facility] ED provider on call, cannot reach. Spoke with [facility] Charge Nurse, Damien who was given report over the phone and verbalized understanding of situation. I have messaged ED physician, Dr. Bernardino Fireman, regarding this patient.   Julia Crate, NP

## 2023-11-30 NOTE — Discharge Instructions (Signed)
 Follow-up in cardiology office with Dr. Debera.  Continue your home medications as prescribed.  Return to the emergency department for any new or worsening symptoms of concern.

## 2023-11-30 NOTE — Sedation Documentation (Addendum)
 Pt sats 87 % on ra. Pt bagged by BVM briefly until sats returned to 97% on RA. Pt now alert to voice stimuli.

## 2023-11-30 NOTE — Progress Notes (Addendum)
 Cardiology Office Note   Date:  11/30/2023  ID:  Julia Martinez, DOB [birth year], MRN [id number] PCP: Suanne Pfeiffer, NP  [facility] Providers Cardiologist:  Jayson Sierras, MD     History of Present Illness Julia Martinez is a 73 y.o. female with a PMH of A-fib, PSVT, hypertension, and GERD, who presents today for scheduled follow-up.  She underwent TEE/cardioversion on Oct 09, 2023.  Was seen for follow-up after TEE/cardioversion with Laymon Qua, PA-C on October 22, 2023 in the office.  She reported feeling well since her recent cardioversion.  Only reported brief palpitations lasting for few seconds without persistent symptoms.  She contacted our office on November 29, 2023 noting that she is back in A-fib, reported feeling weak as water .  Clemens last Tuesday.  She presents today for follow-up.  She states she is under a lot of stress after she fell. 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 She also admits to episodes of near syncope, but denies any syncope. Had to sit down after grocery shopping recently. Admits to racing heart and feeling of malaise. Denies any chest pain, shortness of breath, syncope, orthopnea, PND, swelling or significant weight changes, acute bleeding, or claudication. She is compliant with her medications and denies any skipped doses of Eliquis  within the past 3-4 weeks.   ROS: Negative. See HPI.  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   Studies Reviewed  EKG: EKG Interpretation Date/Time:  Monday November 30 2023 09:37:59 EDT Ventricular Rate:  135 PR Interval:    QRS Duration:  132 QT Interval:  336 QTC Calculation: 504 R Axis:   99  Text Interpretation: Atrial fibrillation with rapid ventricular response Rightward axis Non-specific intra-ventricular conduction block Cannot rule out Septal infarct (cited on or before 30-Nov-2023) T wave abnormality, consider  inferior ischemia T wave abnormality, consider anterolateral ischemia When compared with ECG of 22-Oct-2023 11:25, Significant changes have occurred Confirmed by Miriam Norris [phone]) on 11/30/2023 9:41:50 AM    TEE 09/2023:  1. Left ventricular ejection fraction, by estimation, is 60 to 65%. The  left ventricle has normal function.   2. Right ventricular systolic function is low normal. The right  ventricular size is mildly enlarged.   3. Left atrial size was severely dilated. No left atrial/left atrial  appendage thrombus was detected. The LAA emptying velocity was 62 cm/s.   4. The mitral valve is abnormal. Mild mitral valve regurgitation. No  evidence of mitral stenosis.   5. The tricuspid valve is abnormal. Tricuspid valve regurgitation is  moderate.   6. The aortic valve is tricuspid. There is mild calcification of the  aortic valve. There is mild thickening of the aortic valve. Aortic valve  regurgitation is not visualized. No aortic stenosis is present.   7. Agitated saline contrast bubble study was negative, with no evidence  of any interatrial shunt.No evidence of intracardiac thrombus.  Physical Exam VS:  BP 116/70   Pulse (!) 150   Ht 5' 3 (1.6 m)   Wt 181 lb 3.2 oz (82.2 kg)   SpO2 98%   BMI 32.10 kg/m        Wt Readings from Last 3 Encounters:  11/30/23 181 lb 3.2 oz (82.2 kg)  10/22/23 184 lb (83.5 kg)  10/05/23 182 lb (82.6 kg)    GEN: Obese, 73 y.o. female in no acute distress NECK: No JVD; No carotid bruits CARDIAC:  S1/S2, irregularly irregular rhythm and fast rhythm, no murmurs, rubs, gallops RESPIRATORY:  Clear and diminished to auscultation without rales, wheezing or rhonchi  ABDOMEN: Soft, non-tender, non-distended EXTREMITIES:  No edema; No deformity   ASSESSMENT AND PLAN  A-fib with RVR EKG today reveals A-fib with RVR, heart rate 150 bpm.  She is symptomatic with this and says she does not feel well, admits to episodes of near syncope.  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  Highly advised/encouraged her to go to the ED today.  She says she is willing to go to [facility] to be monitored, but is hesitant to be admitted as she does not have someone to watch her son at this current time. Verbalizes she will proceed with going to the ED.  May possibly need to consider cardioversion when she arrives to the ED, denies any skipped doses of Eliquis  for the past 3 to 4 weeks. Offered/advised EMS transport, patient declines at this time.  Would highly recommend EP consultation/referral, as her A-fib episodes are happening frequently.  Dispo: Will have her follow-up ASAP once she is d/c from [facility].  Pt verbalized understanding. Keep follow-up with Dr. Debera in August, pt requests to keep this appointment.      Signed, Almarie Crate, NP

## 2023-11-30 NOTE — Patient Instructions (Addendum)
 Medication Instructions:  Your physician recommends that you continue on your current medications as directed. Please refer to the Current Medication list given to you today.  Labwork: None   Testing/Procedures: None   Follow-Up: Your physician recommends that you schedule a follow-up appointment in:  [facility]  Keep appointment with Dr.McDowell Please call our office after discharge to see if we have availability prior to your visit with Dr.McDowell   Any Other Special Instructions Will Be Listed Below (If Applicable).  If you need a refill on your cardiac medications before your next appointment, please call your pharmacy.

## 2023-11-30 NOTE — Telephone Encounter (Signed)
 MYChart Message from patient:  It is Sunday afternoon and I am back in a-fib. I am weak as water . I fell on Tuesday and hurt my wrist and my home ac went out during the night on Thursday. When I woke up at about 3:00 am I realized I was in a-fib. I do think I may have reverted for several hours yesterday. Please let me know what I need to do next. I am so depressed and all I can do is cry. I had hoped to go to the beach with Andrea and her family on 7/26 and have been working on getting [employer] a respite facility. I would be afraid to go feeling like I do now. Thanks!!!       11/30/23 offered apt today at [facility] site at 0930, she will see E.Peck,NP

## 2023-11-30 NOTE — ED Notes (Addendum)
 EDP & respiratory at bedside for synchronized cardioversion. Pt has singed consent.

## 2023-11-30 NOTE — ED Triage Notes (Signed)
 Pt arrived via POV c/o left chest/ left arm discomfort and reports being advised from her [facility] appointment this morning to seek further treatment in the ER for A-Fib, HR 150, and chest and arm discomfort. Pt reports she did take her Eliquis  this morning.

## 2023-11-30 NOTE — ED Provider Notes (Signed)
 [place] EMERGENCY DEPARTMENT AT [facility] Provider Note   CSN: [id number] Arrival date & time: 11/30/23  1038     Patient presents with: Chest Pain   Julia Martinez is a 73 y.o. female.   HPI Patient presents for generalized weakness.  Medical history includes HTN, Crohn's disease, prior GI bleeding, anemia, MDS, PSVT.  Her medications include metoprolol , Eliquis , Cardizem .  She underwent TEE/cardioversion 2 months ago.  She has had brief episodes of palpitations since then.  She has been adherent to home medications, including Eliquis .  She typically take metoprolol  and diltiazem  at nighttime.  3 days ago, her air conditioning went out.  She feels like she went to atrial fibrillation with RVR at that time.  She has since had generalized weakness and near syncopal symptoms.  She was seen at cardiology office today for routine follow-up.  A-fib with RVR was confirmed with heart rate in the range of 150.  She was sent to the ED for further evaluation.    Prior to Admission medications   Medication Sig Start Date End Date Taking? Authorizing Provider  adalimumab  (HUMIRA , 2 PEN,) 40 MG/0.4ML pen Inject 0.4 mLs (40 mg total) into the skin every 14 (fourteen) days. 06/11/23  Yes Rourk, Lamar HERO, MD  apixaban  (ELIQUIS ) 5 MG TABS tablet Take 1 tablet (5 mg total) by mouth 2 (two) times daily. 10/22/23  Yes Strader, [place] M, PA-C  denosumab  (PROLIA ) 60 MG/ML SOSY injection Inject 60 mg into the skin every 6 (six) months.   Yes [provider]  diltiazem  (CARDIZEM  CD) 240 MG 24 hr capsule TAKE ONE CAPSULE (240MG  TOTAL) BY MOUTH DAILY 10/22/23  Yes Strader, [place] M, PA-C  losartan  (COZAAR ) 50 MG tablet Take 1 tablet (50 mg total) by mouth daily. 10/22/23 10/16/24 Yes Strader, Laymon HERO, PA-C  metoprolol  succinate (TOPROL  XL) 100 MG 24 hr tablet Take 1 tablet (100 mg total) by mouth at bedtime. 10/22/23  Yes Strader, [place] M, PA-C  metoprolol  succinate (TOPROL -XL) 25 MG 24 hr  tablet Take 12.5 mg by mouth daily.   Yes [provider]  Vitamin D, Ergocalciferol, (DRISDOL) 1.25 MG (50000 UNIT) CAPS capsule Take 50,000 Units by mouth once a week. Patient not taking: Reported on 11/30/2023 08/12/23   [provider]    Allergies: Penicillins    Review of Systems  Constitutional:  Positive for fatigue.  Cardiovascular:  Positive for chest pain and palpitations.  Neurological:  Positive for weakness (Generalized) and light-headedness.  All other systems reviewed and are negative.   Updated Vital Signs BP (!) 118/55   Pulse 61   Temp 97.6 F (36.4 C) (Oral)   Resp 19   Ht 5' 3 (1.6 m)   Wt 82.2 kg   SpO2 95%   BMI 32.10 kg/m   Physical Exam Vitals and nursing note reviewed.  Constitutional:      General: She is not in acute distress.    Appearance: She is well-developed. She is not ill-appearing, toxic-appearing or diaphoretic.  HENT:     Head: Normocephalic and atraumatic.  Eyes:     Conjunctiva/sclera: Conjunctivae normal.  Cardiovascular:     Rate and Rhythm: Tachycardia present. Rhythm irregular.  Pulmonary:     Effort: Pulmonary effort is normal. No respiratory distress.  Abdominal:     Palpations: Abdomen is soft.  Musculoskeletal:        General: No swelling. Normal range of motion.     Cervical back: Normal range of  motion and neck supple.  Skin:    General: Skin is warm and dry.     Capillary Refill: Capillary refill takes less than 2 seconds.     Coloration: Skin is not cyanotic or pale.  Neurological:     General: No focal deficit present.     Mental Status: She is alert and oriented to person, place, and time.  Psychiatric:        Mood and Affect: Mood normal.        Behavior: Behavior normal.     (all labs ordered are listed, but only abnormal results are displayed) Labs Reviewed  BASIC METABOLIC PANEL WITH GFR - Abnormal; Notable for the following components:      Result Value   Glucose, Bld 164 (*)     Creatinine, Ser 1.25 (*)    Calcium 11.2 (*)    GFR, Estimated 46 (*)    All other components within normal limits  CBC WITH DIFFERENTIAL/PLATELET - Abnormal; Notable for the following components:   WBC 11.4 (*)    Hemoglobin 15.7 (*)    HCT 47.1 (*)    All other components within normal limits  MAGNESIUM  CBG MONITORING, ED  TROPONIN I (HIGH SENSITIVITY)  TROPONIN I (HIGH SENSITIVITY)    EKG: EKG Interpretation Date/Time:  Monday November 30 2023 11:11:45 EDT Ventricular Rate:  67 PR Interval:  148 QRS Duration:  166 QT Interval:  369 QTC Calculation: 390 R Axis:   109  Text Interpretation: Sinus rhythm Nonspecific intraventricular conduction delay Repol abnrm, severe global ischemia (LM/MVD) Baseline wander in lead(s) II III aVF V5 V6 Confirmed by Melvenia Motto ([id number]) on 11/30/2023 11:59:45 AM  Radiology: No results found.   .Sedation  Date/Time: 11/30/2023 11:18 AM  Performed by: Melvenia Motto, MD Authorized by: Melvenia Motto, MD   Consent:    Consent obtained:  Verbal   Consent given by:  Patient   Risks discussed:  Allergic reaction, inadequate sedation, respiratory compromise necessitating ventilatory assistance and intubation, nausea and vomiting Universal protocol:    Immediately prior to procedure, a time out was called: yes     Patient identity confirmed:  Verbally with patient Indications:    Procedure necessitating sedation performed by:  Physician performing sedation Pre-sedation assessment:    Time since last food or drink:  12 hours   ASA classification: class 4 - patient with severe systemic disease that is a constant threat to life     Mouth opening:  3 or more finger widths   Thyromental distance:  3 finger widths   Mallampati score:  II - soft palate, uvula, fauces visible   Neck mobility: normal     Pre-sedation assessments completed and reviewed: airway patency, cardiovascular function, hydration status, mental status, nausea/vomiting, pain level and  respiratory function   A pre-sedation assessment was completed prior to the start of the procedure Immediate pre-procedure details:    Reassessment: Patient reassessed immediately prior to procedure     Reviewed: vital signs     Verified: bag valve mask available, intubation equipment available, IV patency confirmed, oxygen  available and suction available   Procedure details (see MAR for exact dosages):    Preoxygenation:  Nasal cannula   Sedation:  Etomidate    Intended level of sedation: deep   Analgesia:  None   Intra-procedure monitoring:  Blood pressure monitoring, cardiac monitor, continuous capnometry, continuous pulse oximetry, frequent LOC assessments and frequent vital sign checks   Intra-procedure events: hypoxia     Intra-procedure  management:  Airway repositioning   Total Provider sedation time (minutes):  12 Post-procedure details:   A post-sedation assessment was completed following the completion of the procedure.   Attendance: Constant attendance by certified staff until patient recovered     Recovery: Patient returned to pre-procedure baseline     Post-sedation assessments completed and reviewed: airway patency, cardiovascular function, hydration status, mental status, nausea/vomiting, pain level and respiratory function     Patient is stable for discharge or admission: yes     Procedure completion:  Tolerated well, no immediate complications .Cardioversion  Date/Time: 11/30/2023 11:19 AM  Performed by: Melvenia Motto, MD Authorized by: Melvenia Motto, MD   Consent:    Consent obtained:  Verbal   Consent given by:  Patient   Risks discussed:  Induced arrhythmia, pain, cutaneous burn and death   Alternatives discussed:  Rate-control medication Pre-procedure details:    Cardioversion basis:  Elective   Rhythm:  Atrial fibrillation   Electrode placement:  Anterior-posterior Patient sedated: Yes. Refer to sedation procedure documentation for details of sedation.  Attempt  one:    Cardioversion mode:  Synchronous   Waveform:  Biphasic   Shock (Joules):  200   Shock outcome:  Conversion to normal sinus rhythm Post-procedure details:    Patient status:  Awake   Patient tolerance of procedure:  Tolerated well, no immediate complications    Medications Ordered in the ED  ondansetron  (ZOFRAN ) injection 4 mg (4 mg Intravenous Given 11/30/23 1055)  etomidate  (AMIDATE ) injection 8.22 mg (8.22 mg Intravenous Given 11/30/23 1110)  lactated ringers  bolus 500 mL (500 mLs Intravenous Bolus 11/30/23 1125)                                    Medical Decision Making Amount and/or Complexity of Data Reviewed Labs: ordered.  Risk Prescription drug management.   This patient presents to the ED for concern of generalized weakness, this involves an extensive number of treatment options, and is a complaint that carries with it a high risk of complications and morbidity.  The differential diagnosis includes arrhythmia, dehydration, metabolic derangements, CHF   Co morbidities / Chronic conditions that complicate the patient evaluation  HTN, Crohn's disease, prior GI bleeding, anemia, MDS, PSVT   Additional history obtained:  Additional history obtained from EMR External records from outside source obtained and reviewed including N/A   Lab Tests:  I Ordered, and personally interpreted labs.  The pertinent results include: Creatinine is mildly increased in baseline, mild hypocalcemia is present with otherwise normal electrolytes, mild leukocytosis is present.  Hemoglobin increased in baseline suggest hemoconcentration.  Troponin is normal.  Cardiac Monitoring: / EKG:  The patient was maintained on a cardiac monitor.  I personally viewed and interpreted the cardiac monitored which showed an underlying rhythm of: Atrial fibrillation, subsequently converted to normal sinus rhythm.   Problem List / ED Course / Critical interventions / Medication management  Patient  presenting from cardiology office for atrial fibrillation with RVR.  She noticed a onset of generalized weakness and near syncopal symptoms 3 nights ago.  She has a history of atrial fibrillation and was cardioverted 2 months ago.  She has been adherent to all of her home medications, including Eliquis .  She denies any missed doses in the past 4 weeks.  She takes metoprolol  and diltiazem  at home.  Last doses of these were last night.  On arrival in the  ED, patient is well-appearing.  She remains tachycardic.  She is very much in favor of attempt of cardioversion with discharge home today if successful.  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  Laboratory workup was initiated.  Patient was placed on monitor.  She underwent successful cardioversion, as per procedure note above.  She had resolution of weakness and anxiety.  She remained in normal sinus rhythm for over an hour of ED observation.  Her lab work is notable for slight increase in creatinine from baseline and mild hypercalcemia.  IV fluids were ordered.  She has an upcoming appointment scheduled with Dr. Debera.  Patient was discharged in stable condition. I ordered medication including Zofran  for nausea prophylaxis; etomidate  for procedural sedation; IV fluids for hydration Reevaluation of the patient after these medicines showed that the patient improved I have reviewed the patients home medicines and have made adjustments as needed  Social Determinants of Health:  Lives independently, caregiver for special needs son  CRITICAL CARE Performed by: Bernardino Fireman   Total critical care time: 32 minutes  Critical care time was exclusive of separately billable procedures and treating other patients.  Critical care was necessary to treat or prevent imminent or life-threatening deterioration.  Critical care was time spent personally by me on the following activities: development of treatment plan with patient  and/or surrogate as well as nursing, discussions with consultants, evaluation of patient's response to treatment, examination of patient, obtaining history from patient or surrogate, ordering and performing treatments and interventions, ordering and review of laboratory studies, ordering and review of radiographic studies, pulse oximetry and re-evaluation of patient's condition.       Final diagnoses:  Atrial fibrillation with RVR [facility])    ED Discharge Orders     None          Fireman Bernardino, MD 11/30/23 1230

## 2023-11-30 NOTE — Sedation Documentation (Signed)
 Pt shocked @ 200J by Dr.Dixon  Pt rhythm is now NSR w/ a rate of 60

## 2023-12-02 ENCOUNTER — Telehealth: Payer: Self-pay | Admitting: Licensed Clinical Social Worker

## 2023-12-02 NOTE — Telephone Encounter (Signed)
 H&V Care Navigation CSW Progress Note  Clinical Social Worker contacted patient by phone to f/u again on mental health resources. Pt answered at [phone]. Shares he has two contractors working at her house currently and can't speak now. Encouraged her to call me back as able, she states its on her list. Remain available as needed. Had provided pt with several mental health resources and referred to [facility] program but note she had either hung up or been disconnected when they called. Will attempt one more time to engage pt as able.  Patient is participating in a Managed Medicaid Plan:  No Medicare and supplement  SDOH Screenings   Food Insecurity: No Food Insecurity (10/23/2023)  Housing: Low Risk  (10/23/2023)  Transportation Needs: No Transportation Needs (10/23/2023)  Utilities: Not At Risk (10/23/2023)  Depression (PHQ2-9): Low Risk  (06/18/2023)  Financial Resource Strain: Low Risk  (10/23/2023)  Physical Activity: Unknown (08/10/2023)   Received from [facility]  Social Connections: Somewhat Isolated (08/10/2023)   Received from [facility]  Stress: Stress Concern Present (10/23/2023)  Tobacco Use: Low Risk  (11/30/2023)  Health Literacy: Adequate Health Literacy (10/23/2023)     Marit Lark, MSW, LCSW Clinical Social Worker II [facility] Health Heart/Vascular Care Navigation  [phone]- work cell phone (preferred)

## 2023-12-04 ENCOUNTER — Telehealth: Payer: Self-pay | Admitting: Licensed Clinical Social Worker

## 2023-12-04 NOTE — Telephone Encounter (Signed)
 H&V Care Navigation CSW Progress Note  Clinical Social Worker contacted patient by phone to f/u again on mental health resources. Pt answered at [phone]. Had provided pt with several mental health resources and referred to [facility] program but note she had either hung up or been disconnected when they called. She is agreeable to re-referral for that program. I will be happy to refer her again, sent secure email to [facility] with [facility] for [facility] and Chief of Staff for re-referral.   Patient is participating in a Managed Medicaid Plan:  No, Medicare and [employer] supplement  SDOH Screenings   Food Insecurity: No Food Insecurity (10/23/2023)  Housing: Low Risk  (10/23/2023)  Transportation Needs: No Transportation Needs (10/23/2023)  Utilities: Not At Risk (10/23/2023)  Depression (PHQ2-9): Low Risk  (06/18/2023)  Financial Resource Strain: Low Risk  (10/23/2023)  Physical Activity: Unknown (08/10/2023)   Received from [facility]  Social Connections: Somewhat Isolated (08/10/2023)   Received from [facility]  Stress: Stress Concern Present (10/23/2023)  Tobacco Use: Low Risk  (11/30/2023)  Health Literacy: Adequate Health Literacy (10/23/2023)    Marit Lark, MSW, LCSW Clinical Social Worker II [facility] Health Heart/Vascular Care Navigation  [phone]- work cell phone (preferred)

## 2023-12-07 ENCOUNTER — Ambulatory Visit: Admitting: Cardiology

## 2023-12-09 NOTE — Telephone Encounter (Signed)
 H&V Care Navigation CSW Progress Note  Clinical Social Worker received an update from Veterinary surgeon of [employer] to provide update about referral for services. Yes, no problem! It looks like we were indeed able to get in touch with her and completed her screen this morning - she is eligible and will begin receiving services with our [facility] Worker, Ajar!   LCSW remains available should pt have any ongoing concerns/need additional resources.  Patient is participating in a Managed Medicaid Plan:  No, UHC Medicare   SDOH Screenings   Food Insecurity: No Food Insecurity (10/23/2023)  Housing: Low Risk  (10/23/2023)  Transportation Needs: No Transportation Needs (10/23/2023)  Utilities: Not At Risk (10/23/2023)  Depression (PHQ2-9): Low Risk  (06/18/2023)  Financial Resource Strain: Low Risk  (10/23/2023)  Physical Activity: Unknown (08/10/2023)   Received from [facility]  Social Connections: Somewhat Isolated (08/10/2023)   Received from [facility]  Stress: Stress Concern Present (10/23/2023)  Tobacco Use: Low Risk  (11/30/2023)  Health Literacy: Adequate Health Literacy (10/23/2023)    Marit Lark, MSW, LCSW Clinical Social Worker II [facility] Health Heart/Vascular Care Navigation  [phone]- work cell phone (preferred)

## 2023-12-15 ENCOUNTER — Ambulatory Visit: Admitting: Cardiology

## 2023-12-22 ENCOUNTER — Encounter: Payer: Medicare Other | Attending: Adult Health Nurse Practitioner | Admitting: *Deleted

## 2023-12-22 VITALS — BP 159/84 | HR 64 | Temp 97.7°F

## 2023-12-22 DIAGNOSIS — M810 Age-related osteoporosis without current pathological fracture: Secondary | ICD-10-CM | POA: Diagnosis not present

## 2023-12-22 MED ORDER — DENOSUMAB 60 MG/ML ~~LOC~~ SOSY
60.0000 mg | PREFILLED_SYRINGE | Freq: Once | SUBCUTANEOUS | Status: AC
Start: 2023-12-22 — End: 2023-12-22
  Administered 2023-12-22: 60 mg via SUBCUTANEOUS

## 2023-12-22 NOTE — Telephone Encounter (Signed)
 Reports she has been under a lot of stress and experienced a horrible night mare this Sunday night into Monday morning. Reports when she woke up she felt weak and had chest pain rated 2/10. Reports she feels better this morning and doesn't feel as weak. Denies active chest pain, SOB, dizziness. Appointment arranged with Miriam on 12/24/2023 @1 :00 pm [place] office. Verbalized understanding of plan.

## 2023-12-22 NOTE — Progress Notes (Signed)
 Diagnosis: Osteoporosis  Provider:  Roe Rutherford NP  Procedure: Injection  Prolia (Denosumab), Dose: 60 mg, Site: subcutaneous, Number of injections: 1  Injection Site(s): Right arm  Post Care: Observation period completed  Discharge: Condition: Good, Destination: Home . AVS Provided  Performed by:  Daleen Squibb, RN

## 2023-12-24 ENCOUNTER — Encounter: Payer: Self-pay | Admitting: Nurse Practitioner

## 2023-12-24 ENCOUNTER — Ambulatory Visit: Attending: Nurse Practitioner | Admitting: Nurse Practitioner

## 2023-12-24 VITALS — BP 150/100 | HR 61 | Ht 63.0 in | Wt 183.6 lb

## 2023-12-24 DIAGNOSIS — F419 Anxiety disorder, unspecified: Secondary | ICD-10-CM

## 2023-12-24 DIAGNOSIS — Z79899 Other long term (current) drug therapy: Secondary | ICD-10-CM

## 2023-12-24 DIAGNOSIS — R002 Palpitations: Secondary | ICD-10-CM

## 2023-12-24 DIAGNOSIS — Z7901 Long term (current) use of anticoagulants: Secondary | ICD-10-CM | POA: Diagnosis not present

## 2023-12-24 DIAGNOSIS — I10 Essential (primary) hypertension: Secondary | ICD-10-CM

## 2023-12-24 DIAGNOSIS — D72829 Elevated white blood cell count, unspecified: Secondary | ICD-10-CM

## 2023-12-24 DIAGNOSIS — I4891 Unspecified atrial fibrillation: Secondary | ICD-10-CM | POA: Diagnosis not present

## 2023-12-24 DIAGNOSIS — E669 Obesity, unspecified: Secondary | ICD-10-CM

## 2023-12-24 DIAGNOSIS — I4819 Other persistent atrial fibrillation: Secondary | ICD-10-CM

## 2023-12-24 DIAGNOSIS — F439 Reaction to severe stress, unspecified: Secondary | ICD-10-CM

## 2023-12-24 NOTE — Progress Notes (Addendum)
 Cardiology Office Note   Date:  12/24/2023 ID:  Julia Martinez, DOB [birth year], MRN [id number] PCP: Suanne Pfeiffer, NP  [facility] Providers Cardiologist:  Jayson Sierras, MD     History of Present Illness Julia Martinez is a 73 y.o. female with a PMH of A-fib, PSVT, hypertension, and GERD, who presents today for scheduled follow-up.  She underwent TEE/cardioversion on Oct 09, 2023.  Was seen for follow-up after TEE/cardioversion with Laymon Qua, PA-C on October 22, 2023 in the office.  She reported feeling well since her recent cardioversion.  Only reported brief palpitations lasting for few seconds without persistent symptoms.  She contacted our office on November 29, 2023 noting that she is back in A-fib, reported feeling weak as water .  Fell last Tuesday.  11/30/2023 - She presents today for follow-up.  She states she is under a lot of stress after she fell. 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 She also admits to episodes of near syncope, but denies any syncope. Had to sit down after grocery shopping recently. Admits to racing heart and feeling of malaise. Denies any chest pain, shortness of breath, syncope, orthopnea, PND, swelling or significant weight changes, acute bleeding, or claudication. She is compliant with her medications and denies any skipped doses of Eliquis  within the past 3-4 weeks.  Was sent to ED for further evaluation of her A-fib with RVR that was identified in the office that day.  ED visit on November 30, 2023 and presented with CP.  Had reported generalized weakness and near syncopal symptoms.  Underwent cardioversion in the ED that was successful.  Patient reported resolution of anxiety and weakness.  Today she presents for follow-up.  Admits to increased anxiety as well as weakness, unsure if she is in A-fib or back in A-fib. Attributes her elevated BP d/t stress/anxiety. Denies  any chest pain, shortness of breath, syncope, presyncope, dizziness, orthopnea, PND, swelling or significant weight changes, acute bleeding, or claudication.  ROS: Negative. See HPI.  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   Studies Reviewed  EKG: EKG Interpretation Date/Time:  Thursday December 24 2023 13:32:56 EDT Ventricular Rate:  62 PR Interval:  176 QRS Duration:  142 QT Interval:  424 QTC Calculation: 430 R Axis:   81  Text Interpretation: Normal sinus rhythm Right bundle branch block T wave abnormality, consider inferolateral ischemia When compared with ECG of 30-Nov-2023 11:11, PREVIOUS ECG IS PRESENT Confirmed by Miriam Norris [phone]) on 12/24/2023 1:34:07 PM   TEE 09/2023:  1. Left ventricular ejection fraction, by estimation, is 60 to 65%. The  left ventricle has normal function.   2. Right ventricular systolic function is low normal. The right  ventricular size is mildly enlarged.   3. Left atrial size was severely dilated. No left atrial/left atrial  appendage thrombus was detected. The LAA emptying velocity was 62 cm/s.   4. The mitral valve is abnormal. Mild mitral valve regurgitation. No  evidence of mitral stenosis.   5. The tricuspid valve is abnormal. Tricuspid valve regurgitation is  moderate.   6. The aortic valve is tricuspid. There is mild calcification of the  aortic valve. There is mild thickening of the aortic valve. Aortic valve  regurgitation is not visualized. No aortic stenosis is present.   7. Agitated saline contrast bubble study was negative, with no evidence  of any interatrial shunt.No evidence of intracardiac thrombus.  Physical  Exam VS:  BP (!) 150/100   Pulse 61   Ht 5' 3 (1.6 m)   Wt 183 lb 9.6 oz (83.3 kg)   SpO2 96%   BMI 32.52 kg/m        Wt Readings from Last 3 Encounters:  12/24/23 183 lb 9.6 oz (83.3 kg)  11/30/23 181 lb 3.2 oz (82.2 kg)  11/30/23 181 lb 3.2 oz (82.2 kg)    GEN: Obese, 73 y.o. female in no acute  distress NECK: No JVD; No carotid bruits CARDIAC: S1/S2, irregularly irregular rhythm and fast rhythm, no murmurs, rubs, gallops RESPIRATORY:  Clear and diminished to auscultation without rales, wheezing or rhonchi  ABDOMEN: Soft, non-tender, non-distended EXTREMITIES:  No edema; No deformity   ASSESSMENT AND PLAN  A-fib, s/p DCCV, palpitations, current use of long-term anticoagulation Underwent successful cardioversion during her her most recent ED visit.  She continues to maintain sinus rhythm today on EKG.  Will obtain CBC and BMET since her cardioversion.  I believe her stress/anxiety is contributing to her A-fib/palpitations.  See below.  Continue follow-up with PCP.  Continue Toprol -XL and diltiazem .  Continue Eliquis  for stroke prevention.  HTN Blood pressure is elevated today on arrival, repeat BP is 150/100.  And attributes her elevated BP to stress/anxiety-see below.  Discussed to monitor and log BP and update us  in the next 2 to 3 weeks with her readings. Discussed to monitor BP at home at least 2 hours after medications and sitting for 5-10 minutes.  No medication changes at this time. Heart healthy diet and regular cardiovascular exercise encouraged.   Obesity Weight loss via diet and exercise encouraged. Discussed the impact being overweight would have on cardiovascular risk.   Stress/anxiety Denies any red flag signs or symptoms.  Did discuss/recommend therapy.  Recommended to discuss this further with her PCP.  She verbalized understanding.  5.  Leukocytosis Most recent WBC count was 11.4.  Will repeat labs as noted above.  Continue follow-up with PCP.    Dispo: Follow-up with MD/APP in 6 to 8 weeks or sooner if any changes.  Signed, Almarie Crate, NP

## 2023-12-24 NOTE — Patient Instructions (Addendum)
 Medication Instructions:   Continue all current medications.   Labwork:  CBC, BMET - orders given today Please do in 1-2 weeks Office will contact with results via phone, letter or mychart.     Testing/Procedures:  none  Follow-Up:  6 - 8 weeks   Any Other Special Instructions Will Be Listed Below (If Applicable).  BP log x 2-3 wks  If you need a refill on your cardiac medications before your next appointment, please call your pharmacy.

## 2024-01-08 ENCOUNTER — Ambulatory Visit: Admitting: Cardiology

## 2024-02-16 ENCOUNTER — Ambulatory Visit: Admitting: Nurse Practitioner

## 2024-03-07 NOTE — Progress Notes (Unsigned)
 Cardiology Office Note    Date:  03/08/2024  ID:  [name], DOB Jun 15, 1950, MRN [id number] Cardiologist: Jayson Sierras, MD Cardiology APP:  Johnson Laymon HERO, PA-C { : History of Present Illness:    Julia Martinez is a 73 y.o. female with past medical history of paroxysmal atrial fibrillation (s/p TEE/DCCV in 09/2023, recurrence in 11/2023), HTN, PSVT and GERD who presents to the office today for evaluation of palpitations.  She was examined by Almarie Crate, NP in 11/2023 and found to be in atrial fibrillation with RVR and sent to the ED for further evaluation. She underwent successful DCCV while in the ED and was sent home. She did follow-up with Almarie again on 12/24/2023 and reported worsening anxiety and weakness. EKG showed she was maintaining normal sinus rhythm and it was felt that stress and anxiety were contributing to her palpitations.  She sent a message to the office on 03/07/2024 reporting more frequent palpitations and was taking extra Toprol -XL as needed with improvement in symptoms. A follow-up visit was therefore arranged.  In talking with the patient today, she reports feeling that she went into atrial fibrillation last Friday. Says that she could feel palpitations and her heart rate was elevated when checked at home. Reports Friday had been more stressful as the caregiver for her special needs son had quit earlier in the week. Family members were also helping to clean out clothes at her home as her husband passed away unexpectedly last year. She has been feeling very weak and fatigued since last Friday. No specific orthopnea, PND or pitting edema. Reports good compliance with her medications, including Eliquis . No recently missed doses.  Studies Reviewed:   EKG: EKG is ordered today and demonstrates:   EKG Interpretation Date/Time:  Tuesday March 08 2024 07:59:51 EDT Ventricular Rate:  101 PR Interval:    QRS Duration:  134 QT Interval:  318 QTC  Calculation: 412 R Axis:   106  Text Interpretation: Atrial fibrillation with rapid ventricular response Rightward axis  Right bundle branch block Confirmed by Johnson Laymon ([id number]) on 03/08/2024 8:02:36 AM       TEE: 09/2023 IMPRESSIONS     1. Left ventricular ejection fraction, by estimation, is 60 to 65%. The  left ventricle has normal function.   2. Right ventricular systolic function is low normal. The right  ventricular size is mildly enlarged.   3. Left atrial size was severely dilated. No left atrial/left atrial  appendage thrombus was detected. The LAA emptying velocity was 62 cm/s.   4. The mitral valve is abnormal. Mild mitral valve regurgitation. No  evidence of mitral stenosis.   5. The tricuspid valve is abnormal. Tricuspid valve regurgitation is  moderate.   6. The aortic valve is tricuspid. There is mild calcification of the  aortic valve. There is mild thickening of the aortic valve. Aortic valve  regurgitation is not visualized. No aortic stenosis is present.   7. Agitated saline contrast bubble study was negative, with no evidence  of any interatrial shunt.No evidence of intracardiac thrombus.    Risk Assessment/Calculations:   CHA2DS2-VASc Score = 3  This indicates a 3.2% annual risk of stroke. The patient's score is based upon: CHF History: 0 HTN History: 1 Diabetes History: 0 Stroke History: 0 Vascular Disease History: 0 Age Score: 1 Gender Score: 1    Physical Exam:   VS:  BP 122/84   Pulse (!) 101   Ht 5' 3 (1.6 m)   Wt  183 lb 9.6 oz (83.3 kg)   SpO2 96%   BMI 32.52 kg/m    Wt Readings from Last 3 Encounters:  03/08/24 183 lb 9.6 oz (83.3 kg)  12/24/23 183 lb 9.6 oz (83.3 kg)  11/30/23 181 lb 3.2 oz (82.2 kg)     GEN: Well nourished, well developed female appearing in no acute distress NECK: No JVD; No carotid bruits CARDIAC: Irregularly irregular, no murmurs, rubs, gallops RESPIRATORY:  Clear to auscultation without rales,  wheezing or rhonchi  ABDOMEN: Appears non-distended. No obvious abdominal masses. EXTREMITIES: No clubbing or cyanosis. No pitting edema.  Distal pedal pulses are 2+ bilaterally.   Assessment and Plan:    1. Atrial fibrillation with RVR (HCC) - She previously underwent successful DCCV in 09/2023 but had recurrence in 11/2023 and required DCCV while in the ED.  She is back in atrial fibrillation again today and has been in the rhythm for at least 4 days based off symptoms. Reviewed options with the patient and she prefers to avoid going back to the Emergency Department if able. Therefore, will titrate Toprol -XL in the interim from 100 mg daily to 125 mg daily (will take 25 mg in AM/100 mg in PM as she previously preferred divided dosing) while continuing on Cardizem  CD 240 mg daily. Will arrange for outpatient DCCV as she has been compliant with Eliquis  for over 3 weeks and no recently missed doses. Risks and benefits reviewed and she agrees to proceed. Will check CBC and BMET today. Will route today's note to Dr. Debera to make sure he is in agreement with this plan. We reviewed that if symptoms acutely worsen in the interim, she should proceed to the ED as she would likely be a candidate for DCCV and discharge home. - Continue Eliquis  5 mg twice daily for anticoagulation which is the correct dose given her age (73 years old), weight (183 lbs) and renal function (creatinine 1.25 when checked in 11/2023).  - Given that this is her third episode of symptomatic atrial fibrillation within the past year, will refer to EP for consideration of antiarrhythmic options including antiarrhythmic medications or ablation.  2. PSVT (paroxysmal supraventricular tachycardia) - She is in atrial fibrillation and as discussed above, will increase Toprol -XL to 125 mg daily. Continue Cardizem  CD 240 mg daily as well.  3. Essential hypertension - BP is at 122/84 during today's visit. Will titrate Toprol -XL to 125 mg  daily as discussed above. Continue Cardizem  CD 240 mg daily and Losartan  50 mg daily.   Informed Consent   Shared Decision Making/Informed Consent The risks (stroke, cardiac arrhythmias rarely resulting in the need for a temporary or permanent pacemaker, skin irritation or burns and complications associated with conscious sedation including aspiration, arrhythmia, respiratory failure and death), benefits (restoration of normal sinus rhythm) and alternatives of a direct current cardioversion were explained in detail to Julia Martinez and she agrees to proceed.     Signed, Laymon CHRISTELLA Qua, PA-C

## 2024-03-07 NOTE — Telephone Encounter (Signed)
 Reports having an episode of A-Fib that started Friday night and worsened on yesterday. Reports taking an extra 25 mg metoprolol  succinate on yesterday and her symptoms improved. Requesting to reschedule previous canceled appointment and she prefers to be seen at the [place] office only. Appointment scheduled with [facility] on 03/08/2024 @8 :00 am. ER precautions reviewed; advised to seek ER care if symptoms return or get worse. Verbalized understanding of plan.

## 2024-03-08 ENCOUNTER — Other Ambulatory Visit (HOSPITAL_COMMUNITY)
Admission: RE | Admit: 2024-03-08 | Discharge: 2024-03-08 | Disposition: A | Discharge status: Home / Self Care | Source: Ambulatory Visit | Attending: Student | Admitting: Student

## 2024-03-08 ENCOUNTER — Ambulatory Visit: Payer: Self-pay | Admitting: Student

## 2024-03-08 ENCOUNTER — Encounter: Payer: Self-pay | Admitting: Student

## 2024-03-08 ENCOUNTER — Encounter: Payer: Self-pay | Admitting: *Deleted

## 2024-03-08 ENCOUNTER — Ambulatory Visit: Attending: Student | Admitting: Student

## 2024-03-08 ENCOUNTER — Telehealth: Payer: Self-pay | Admitting: *Deleted

## 2024-03-08 ENCOUNTER — Ambulatory Visit: Admitting: Student

## 2024-03-08 VITALS — BP 122/84 | HR 101 | Ht 63.0 in | Wt 183.6 lb

## 2024-03-08 DIAGNOSIS — I4891 Unspecified atrial fibrillation: Secondary | ICD-10-CM

## 2024-03-08 DIAGNOSIS — I480 Paroxysmal atrial fibrillation: Secondary | ICD-10-CM

## 2024-03-08 DIAGNOSIS — Z7901 Long term (current) use of anticoagulants: Secondary | ICD-10-CM | POA: Diagnosis not present

## 2024-03-08 DIAGNOSIS — I10 Essential (primary) hypertension: Secondary | ICD-10-CM

## 2024-03-08 DIAGNOSIS — Z01818 Encounter for other preprocedural examination: Secondary | ICD-10-CM | POA: Insufficient documentation

## 2024-03-08 DIAGNOSIS — I471 Supraventricular tachycardia, unspecified: Secondary | ICD-10-CM

## 2024-03-08 LAB — CBC
HCT: 46.3 % — ABNORMAL HIGH (ref 36.0–46.0)
Hemoglobin: 15.2 g/dL — ABNORMAL HIGH (ref 12.0–15.0)
MCH: 30.5 pg (ref 26.0–34.0)
MCHC: 32.8 g/dL (ref 30.0–36.0)
MCV: 92.8 fL (ref 80.0–100.0)
Platelets: 290 K/uL (ref 150–400)
RBC: 4.99 MIL/uL (ref 3.87–5.11)
RDW: 13.2 % (ref 11.5–15.5)
WBC: 9.8 K/uL (ref 4.0–10.5)
nRBC: 0 % (ref 0.0–0.2)

## 2024-03-08 LAB — BASIC METABOLIC PANEL WITH GFR
Anion gap: 9 (ref 5–15)
BUN: 19 mg/dL (ref 8–23)
CO2: 25 mmol/L (ref 22–32)
Calcium: 9.4 mg/dL (ref 8.9–10.3)
Chloride: 106 mmol/L (ref 98–111)
Creatinine, Ser: 1.05 mg/dL — ABNORMAL HIGH (ref 0.44–1.00)
GFR, Estimated: 57 mL/min — ABNORMAL LOW (ref >60)
Glucose, Bld: 131 mg/dL — ABNORMAL HIGH (ref 70–99)
Potassium: 4.5 mmol/L (ref 3.5–5.1)
Sodium: 140 mmol/L (ref 135–145)

## 2024-03-08 MED ORDER — METOPROLOL SUCCINATE ER 25 MG PO TB24: 25.0000 mg | ORAL_TABLET | Freq: Every day | ORAL | 3 refills | Status: DC

## 2024-03-08 NOTE — Telephone Encounter (Signed)
 Called to notify pt that DCCV has been rescheduled to 03/15/24 at 9:30  with Dr. Alvan. Pt voiced understanding and thankful for the call.

## 2024-03-08 NOTE — Patient Instructions (Signed)
 Medication Instructions:  Your physician has recommended you make the following change in your medication:   Take Toprol  XL 25 mg in the am and Take 100 mg of Toprol  XL in the pm.   *If you need a refill on your cardiac medications before your next appointment, please call your pharmacy*  Lab Work: Please have this done at [facility]. (hours-Monday through Friday from 8:00 am to 4:00 pm except 11:30 am to 12:10 pm)  If you have labs (blood work) drawn today and your tests are completely normal, you will receive your results only by: MyChart Message (if you have MyChart) OR A paper copy in the mail If you have any lab test that is abnormal or we need to change your treatment, we will call you to review the results.  Testing/Procedures: Your physician has recommended that you have a Cardioversion (DCCV). Electrical Cardioversion uses a jolt of electricity to your heart either through paddles or wired patches attached to your chest. This is a controlled, usually prescheduled, procedure. Defibrillation is done under light anesthesia in the hospital, and you usually go home the day of the procedure. This is done to get your heart back into a normal rhythm. You are not awake for the procedure. Please see the instruction sheet given to you today.    Follow-Up: At [facility], you and your health needs are our priority.  As part of our continuing mission to provide you with exceptional heart care, our providers are all part of one team.  This team includes your primary Cardiologist (physician) and Advanced Practice Providers or APPs (Physician Assistants and Nurse Practitioners) who all work together to provide you with the care you need, when you need it.  Your next appointment:   2 month(s)  Provider:   Jayson Sierras, MD or Laymon Qua, PA-C    We recommend signing up for the patient portal called MyChart.  Sign up information is provided on this After Visit Summary.   MyChart is used to connect with patients for Virtual Visits (Telemedicine).  Patients are able to view lab/test results, encounter notes, upcoming appointments, etc.  Non-urgent messages can be sent to your provider as well.   To learn more about what you can do with MyChart, go to [email or web address].   Other Instructions Thank you for choosing [facility]!

## 2024-03-11 ENCOUNTER — Encounter (HOSPITAL_COMMUNITY)
Admission: RE | Admit: 2024-03-11 | Discharge: 2024-03-11 | Disposition: A | Discharge status: Home / Self Care | Source: Ambulatory Visit | Attending: Cardiology | Admitting: Cardiology

## 2024-03-11 ENCOUNTER — Other Ambulatory Visit: Payer: Self-pay

## 2024-03-11 ENCOUNTER — Encounter (HOSPITAL_COMMUNITY): Payer: Self-pay

## 2024-03-15 ENCOUNTER — Encounter (HOSPITAL_COMMUNITY)
Admission: RE | Disposition: A | Discharge status: Home / Self Care | Payer: Self-pay | Source: Home / Self Care | Attending: Cardiology

## 2024-03-15 ENCOUNTER — Encounter (HOSPITAL_COMMUNITY): Payer: Self-pay | Admitting: Cardiology

## 2024-03-15 ENCOUNTER — Ambulatory Visit (HOSPITAL_COMMUNITY): Payer: Self-pay | Admitting: Certified Registered Nurse Anesthetist

## 2024-03-15 ENCOUNTER — Ambulatory Visit (HOSPITAL_COMMUNITY)
Admission: RE | Admit: 2024-03-15 | Discharge: 2024-03-15 | Disposition: A | Discharge status: Home / Self Care | Attending: Cardiology | Admitting: Cardiology

## 2024-03-15 DIAGNOSIS — Z7901 Long term (current) use of anticoagulants: Secondary | ICD-10-CM | POA: Insufficient documentation

## 2024-03-15 DIAGNOSIS — I10 Essential (primary) hypertension: Secondary | ICD-10-CM

## 2024-03-15 DIAGNOSIS — I471 Supraventricular tachycardia, unspecified: Secondary | ICD-10-CM | POA: Insufficient documentation

## 2024-03-15 DIAGNOSIS — Z79899 Other long term (current) drug therapy: Secondary | ICD-10-CM | POA: Insufficient documentation

## 2024-03-15 DIAGNOSIS — G4730 Sleep apnea, unspecified: Secondary | ICD-10-CM | POA: Insufficient documentation

## 2024-03-15 DIAGNOSIS — I4819 Other persistent atrial fibrillation: Secondary | ICD-10-CM

## 2024-03-15 DIAGNOSIS — Z8711 Personal history of peptic ulcer disease: Secondary | ICD-10-CM | POA: Diagnosis not present

## 2024-03-15 HISTORY — PX: CARDIOVERSION: SHX1299

## 2024-03-15 SURGERY — CARDIOVERSION
Anesthesia: General

## 2024-03-15 MED ORDER — CHLORHEXIDINE GLUCONATE 0.12 % MT SOLN: 15.0000 mL | Freq: Once | OROMUCOSAL | Status: DC

## 2024-03-15 MED ORDER — LACTATED RINGERS IV SOLN
INTRAVENOUS | Status: DC
Start: 2024-03-15 — End: 2024-03-15

## 2024-03-15 MED ORDER — PROPOFOL 10 MG/ML IV BOLUS
INTRAVENOUS | Status: DC | PRN
  Administered 2024-03-15: 40 mg via INTRAVENOUS

## 2024-03-15 MED ORDER — ORAL CARE MOUTH RINSE
15.0000 mL | Freq: Once | OROMUCOSAL | Status: DC
Start: 2024-03-15 — End: 2024-03-15

## 2024-03-15 NOTE — H&P (Signed)
 Procedue H&P   Patient presents for outpatient electrical cardioversion for persistent atrial fibrillation. She is on eliquis  at home for anticoagulation. Plan for procedure today with the assistance of anesthesiology. For full medical history please refer to referenced clinic note below  [name] MD    Cardiology Office Note     Date:  03/08/2024  ID:  Julia Martinez, DOB Jun 17, 1950, MRN [id number] Cardiologist: Jayson Sierras, MD Cardiology APP:  Johnson Laymon HERO, PA-C { : History of Present Illness:     Julia Martinez is a 73 y.o. female with past medical history of paroxysmal atrial fibrillation (s/p TEE/DCCV in 09/2023, recurrence in 11/2023), HTN, PSVT and GERD who presents to the office today for evaluation of palpitations.   She was examined by Almarie Crate, NP in 11/2023 and found to be in atrial fibrillation with RVR and sent to the ED for further evaluation. She underwent successful DCCV while in the ED and was sent home. She did follow-up with Almarie again on 12/24/2023 and reported worsening anxiety and weakness. EKG showed she was maintaining normal sinus rhythm and it was felt that stress and anxiety were contributing to her palpitations.   She sent a message to the office on 03/07/2024 reporting more frequent palpitations and was taking extra Toprol -XL as needed with improvement in symptoms. A follow-up visit was therefore arranged.   In talking with the patient today, she reports feeling that she went into atrial fibrillation last Friday. Says that she could feel palpitations and her heart rate was elevated when checked at home. Reports Friday had been more stressful as the caregiver for her special needs son had quit earlier in the week. Family members were also helping to clean out clothes at her home as her husband passed away unexpectedly last year. She has been feeling very weak and fatigued since last Friday. No specific orthopnea, PND or pitting edema.  Reports good compliance with her medications, including Eliquis . No recently missed doses.   Studies Reviewed:    EKG: EKG is ordered today and demonstrates:     EKG Interpretation Date/Time:                      Tuesday March 08 2024 07:59:51 EDT Ventricular Rate:   101 PR Interval:                        QRS Duration:                    134 QT Interval:                      318 QTC Calculation:412 R Axis:                         106   Text Interpretation: Atrial fibrillation with rapid ventricular response Rightward axis  Right bundle [place] block Confirmed by Johnson Laymon ([id number]) on 03/08/2024 8:02:36 AM           TEE: 09/2023 IMPRESSIONS     1. Left ventricular ejection fraction, by estimation, is 60 to 65%. The  left ventricle has normal function.   2. Right ventricular systolic function is low normal. The right  ventricular size is mildly enlarged.   3. Left atrial size was severely dilated. No left atrial/left atrial  appendage thrombus was detected. The LAA emptying velocity was 62 cm/s.   4.  The mitral valve is abnormal. Mild mitral valve regurgitation. No  evidence of mitral stenosis.   5. The tricuspid valve is abnormal. Tricuspid valve regurgitation is  moderate.   6. The aortic valve is tricuspid. There is mild calcification of the  aortic valve. There is mild thickening of the aortic valve. Aortic valve  regurgitation is not visualized. No aortic stenosis is present.   7. Agitated saline contrast bubble study was negative, with no evidence  of any interatrial shunt.No evidence of intracardiac thrombus.      Risk Assessment/Calculations:    CHA2DS2-VASc Score = 3  This indicates a 3.2% annual risk of stroke. The patient's score is based upon: CHF History: 0 HTN History: 1 Diabetes History: 0 Stroke History: 0 Vascular Disease History: 0 Age Score: 1 Gender Score: 1     Physical Exam:    VS:  BP 122/84   Pulse (!) 101   Ht 5' 3 (1.6 m)    Wt 183 lb 9.6 oz (83.3 kg)   SpO2 96%   BMI 32.52 kg/m       Wt Readings from Last 3 Encounters:  03/08/24 183 lb 9.6 oz (83.3 kg)  12/24/23 183 lb 9.6 oz (83.3 kg)  11/30/23 181 lb 3.2 oz (82.2 kg)      GEN: Well nourished, well developed female appearing in no acute distress NECK: No JVD; No carotid bruits CARDIAC: Irregularly irregular, no murmurs, rubs, gallops RESPIRATORY:  Clear to auscultation without rales, wheezing or rhonchi  ABDOMEN: Appears non-distended. No obvious abdominal masses. EXTREMITIES: No clubbing or cyanosis. No pitting edema.  Distal pedal pulses are 2+ bilaterally.     Assessment and Plan:      1. Atrial fibrillation with RVR (HCC) - She previously underwent successful DCCV in 09/2023 but had recurrence in 11/2023 and required DCCV while in the ED.  She is back in atrial fibrillation again today and has been in the rhythm for at least 4 days based off symptoms. Reviewed options with the patient and she prefers to avoid going back to the Emergency Department if able. Therefore, will titrate Toprol -XL in the interim from 100 mg daily to 125 mg daily (will take 25 mg in AM/100 mg in PM as she previously preferred divided dosing) while continuing on Cardizem  CD 240 mg daily. Will arrange for outpatient DCCV as she has been compliant with Eliquis  for over 3 weeks and no recently missed doses. Risks and benefits reviewed and she agrees to proceed. Will check CBC and BMET today. Will route today's note to Dr. Debera to make sure he is in agreement with this plan. We reviewed that if symptoms acutely worsen in the interim, she should proceed to the ED as she would likely be a candidate for DCCV and discharge home. - Continue Eliquis  5 mg twice daily for anticoagulation which is the correct dose given her age (73 years old), weight (183 lbs) and renal function (creatinine 1.25 when checked in 11/2023).  - Given that this is her third episode of symptomatic atrial  fibrillation within the past year, will refer to EP for consideration of antiarrhythmic options including antiarrhythmic medications or ablation.   2. PSVT (paroxysmal supraventricular tachycardia) - She is in atrial fibrillation and as discussed above, will increase Toprol -XL to 125 mg daily. Continue Cardizem  CD 240 mg daily as well.   3. Essential hypertension - BP is at 122/84 during today's visit. Will titrate Toprol -XL to 125 mg daily as discussed above. Continue Cardizem   CD 240 mg daily and Losartan  50 mg daily.     Informed Consent Shared Decision Making/Informed Consent The risks (stroke, cardiac arrhythmias rarely resulting in the need for a temporary or permanent pacemaker, skin irritation or burns and complications associated with conscious sedation including aspiration, arrhythmia, respiratory failure and death), benefits (restoration of normal sinus rhythm) and alternatives of a direct current cardioversion were explained in detail to Ms. Tramell and she agrees to proceed.        Signed, Laymon CHRISTELLA Qua, PA-C           Electronically signed by Qua Laymon CHRISTELLA, PA-C at 03/08/2024  5:19 PM

## 2024-03-15 NOTE — Progress Notes (Signed)
 Electrical Cardioversion Procedure Note TSUYAKO JOLLEY [id number] 06/19/1950  Procedure: Electrical Cardioversion Indications:  Atrial Fibrillation  Procedure Details Consent: Risks of procedure as well as the alternatives and risks of each were explained to the (patient/caregiver).  Consent for procedure obtained. Time Out: Verified patient identification, verified procedure, site/side was marked, verified correct patient position, special equipment/implants available, medications/allergies/relevent history reviewed, required imaging and test results available. Time out performed 08:22.  Patient placed on cardiac monitor, pulse oximetry, supplemental oxygen  as necessary.  Sedation given: Etomidate  Pacer pads placed anterior and posterior chest.  Cardioverted 1 time(s).  Cardioverted at 200J.  Evaluation Findings: Post procedure EKG shows: NSR Complications: None Patient did tolerate procedure well.   Butler Barnie CROME 03/15/2024, 8:41 AM

## 2024-03-15 NOTE — CV Procedure (Signed)
 CV Procedure Note  Procedure: Electrical cardioversion Indication: Persistent atrial fibrillation Physician: Dr [name] MD   Patient brought to the procedure suite after appropriate consent was obtained. I confirmed with the patient she had not missed any doses of her eliquis  within the last 3 weeks. Defib pads placed in the anterior and posterior positions. Sedation achieved with the assistance of anesthesiology, for details please refer to their documentation. Succesfully cardioverted to sinus rhythm with a single synchronized 200J shock. Cardiopulmonary monitoring performed throughout the procedure, she tolerated well without complications.  Can lower her toprol  back to prior dose of 100mg  daily.   [name] MD

## 2024-03-15 NOTE — Anesthesia Preprocedure Evaluation (Addendum)
 Anesthesia Evaluation  Patient identified by MRN, date of birth, ID band Patient awake    Reviewed: Allergy & Precautions, H&P , NPO status , Patient's Chart, lab work & pertinent test results  Airway Mallampati: II  TM Distance: >3 FB Neck ROM: Full    Dental no notable dental hx.    Pulmonary sleep apnea    Pulmonary exam normal breath sounds clear to auscultation       Cardiovascular hypertension, Normal cardiovascular exam+ dysrhythmias Atrial Fibrillation  Rhythm:Irregular Rate:Tachycardia     Neuro/Psych  PSYCHIATRIC DISORDERS  Depression    negative neurological ROS     GI/Hepatic Neg liver ROS, PUD,,,  Endo/Other  negative endocrine ROS    Renal/GU negative Renal ROS  negative genitourinary   Musculoskeletal negative musculoskeletal ROS (+)    Abdominal   Peds negative pediatric ROS (+)  Hematology  (+) Blood dyscrasia, anemia   Anesthesia Other Findings   Reproductive/Obstetrics negative OB ROS                              Anesthesia Physical Anesthesia Plan  ASA: 3  Anesthesia Plan: General   Post-op Pain Management:    Induction: Intravenous  PONV Risk Score and Plan:   Airway Management Planned: Mask  Additional Equipment:   Intra-op Plan:   Post-operative Plan:   Informed Consent: I have reviewed the patients History and Physical, chart, labs and discussed the procedure including the risks, benefits and alternatives for the proposed anesthesia with the patient or authorized representative who has indicated his/her understanding and acceptance.     Dental advisory given  Plan Discussed with: CRNA  Anesthesia Plan Comments:          Anesthesia Quick Evaluation

## 2024-03-15 NOTE — Anesthesia Postprocedure Evaluation (Signed)
 Anesthesia Post Note  Patient: Julia Martinez  Procedure(s) Performed: CARDIOVERSION  Patient location during evaluation: PACU Anesthesia Type: General Level of consciousness: awake and alert Pain management: pain level controlled Vital Signs Assessment: post-procedure vital signs reviewed and stable Respiratory status: spontaneous breathing, nonlabored ventilation, respiratory function stable and patient connected to nasal cannula oxygen  Cardiovascular status: blood pressure returned to baseline and stable Postop Assessment: no apparent nausea or vomiting Anesthetic complications: no   No notable events documented.   Last Vitals:  Vitals:   03/15/24 0815 03/15/24 0833  BP: 94/60 (!) 99/59  Pulse: (!) 103 60  Resp: (!) 27 (!) 24  Temp:  36.5 C  SpO2: 95% 100%    Last Pain:  Vitals:   03/15/24 0833  TempSrc:   PainSc: 0-No pain                 Andrea Limes

## 2024-03-15 NOTE — Transfer of Care (Signed)
 Immediate Anesthesia Transfer of Care Note  Patient: Julia Martinez  Procedure(s) Performed: CARDIOVERSION  Patient Location: PACU  Anesthesia Type:MAC  Level of Consciousness: awake, alert , oriented, and patient cooperative  Airway & Oxygen  Therapy: Patient Spontanous Breathing and Patient connected to nasal cannula oxygen   Post-op Assessment: Report given to RN and Post -op Vital signs reviewed and stable  Post vital signs: Reviewed and stable  Last Vitals:  Vitals Value Taken Time  BP 89/56   Temp 98.4   Pulse 103 03/15/24 08:14  Resp 27 03/15/24 08:14  SpO2 95 % 03/15/24 08:14  Vitals shown include unfiled device data.  Last Pain:  Vitals:   03/15/24 0801  TempSrc: Oral  PainSc: 0-No pain      Patients Stated Pain Goal: 6 (03/15/24 0801)  Complications: No notable events documented.

## 2024-03-16 ENCOUNTER — Other Ambulatory Visit (HOSPITAL_COMMUNITY)

## 2024-03-23 ENCOUNTER — Encounter: Payer: Self-pay | Admitting: Adult Health Nurse Practitioner

## 2024-03-23 ENCOUNTER — Encounter (HOSPITAL_COMMUNITY): Payer: Self-pay | Admitting: Oncology

## 2024-03-27 NOTE — Progress Notes (Unsigned)
 Cardiology Office Note   Date:  03/27/2024  ID:  Julia Martinez, DOB 10-Dec-1950, MRN [id number] PCP: Suanne Pfeiffer, NP  [facility] Providers Cardiologist:  Jayson Sierras, MD Cardiology APP:  Johnson Laymon HERO, PA-C  Electrophysiologist:  Donnice DELENA Primus, MD    History of Present Illness Julia Martinez is a 73 y.o. female with persistent AF, PSVT, HTN, and GERD who presents for evaluation and management of persistent AF/RVR.   ROS: ***  Studies Reviewed  ECG review 03/15/24: (08:28:47), NSR 62, PR 180, QRS 156, QT/C 440/446 (post DCCV) 03/15/24: (07:53:40), AF/VR 108, QRS 150, QT/c 320/428 (pre DCCV) 03/08/24: AF/VR 101, QRS 134, QT/c 318/412 12/24/23: NSR 62, PR 176, QRS 142, QT/c 424/430  11/30/23: (10:46:24), AF/RVR 141 11/21/23: NSR 62 10/09/23: AF/RVR 114 10/05/23: AF/RVR 144 10/22/22: AF/RVR 130  08/30/19: SB 56 07/09/09: AF/VR 101 07/08/09: AF/RVR 137  TEE  Result date: 10/09/23  1. Left ventricular ejection fraction, by estimation, is 60 to 65%. The  left ventricle has normal function.   2. Right ventricular systolic function is low normal. The right  ventricular size is mildly enlarged.   3. Left atrial size was severely dilated. No left atrial/left atrial  appendage thrombus was detected. The LAA emptying velocity was 62 cm/s.   4. The mitral valve is abnormal. Mild mitral valve regurgitation. No  evidence of mitral stenosis.   5. The tricuspid valve is abnormal. Tricuspid valve regurgitation is  moderate.   6. The aortic valve is tricuspid. There is mild calcification of the  aortic valve. There is mild thickening of the aortic valve. Aortic valve  regurgitation is not visualized. No aortic stenosis is present.   7. Agitated saline contrast bubble study was negative, with no evidence  of any interatrial shunt.No evidence of intracardiac thrombus.   TTE  Result date; 09/12/21  1. Left ventricular ejection fraction, by estimation, is 60 to  65%. The  left ventricle has normal function. The left ventricle has no regional  wall motion abnormalities. There is mild left ventricular hypertrophy.  Left ventricular diastolic parameters  are consistent with Grade I diastolic dysfunction (impaired relaxation).   2. Right ventricular systolic function is low normal. The right  ventricular size is moderately enlarged. There is moderately elevated  pulmonary artery systolic pressure. The estimated right ventricular  systolic pressure is 47.6 mmHg.   3. Left atrial size was moderately dilated.   4. Right atrial size was upper normal.   5. A small pericardial effusion is present. The pericardial effusion is  posterior to the left ventricle.   6. The mitral valve is degenerative. Mild mitral valve regurgitation.   7. The aortic valve is tricuspid. Aortic valve regurgitation is not  visualized. Aortic valve mean gradient measures 3.0 mmHg.   8. The inferior vena cava is normal in size with greater than 50%  respiratory variability, suggesting right atrial pressure of 3 mmHg.   Risk Assessment/Calculations  CHA2DS2-VASc Score = 3  {Confirm score is correct.  If not, click here to update score.  REFRESH note.  :1} This indicates a 3.2% annual risk of stroke. The patient's score is based upon: CHF History: 0 HTN History: 1 Diabetes History: 0 Stroke History: 0 Vascular Disease History: 0 Age Score: 1 Gender Score: 1   {This patient has a significant risk of stroke if diagnosed with atrial fibrillation.  Please consider VKA or DOAC agent for anticoagulation if the bleeding risk is acceptable.   You can also  use the SmartPhrase .[email or web address] for documentation.   :[id number]} No BP recorded.  {Refresh Note OR Click here to enter BP  :1}***       Physical Exam VS:  There were no vitals taken for this visit.       Wt Readings from Last 3 Encounters:  03/15/24 183 lb 9.6 oz (83.3 kg)  03/11/24 183 lb 9.6 oz (83.3 kg)  03/08/24 183 lb  9.6 oz (83.3 kg)    GEN: Well nourished, well developed in no acute distress NECK: No JVD; No carotid bruits CARDIAC: ***RRR, no murmurs, rubs, gallops RESPIRATORY:  Clear to auscultation without rales, wheezing or rhonchi  ABDOMEN: Soft, non-tender, non-distended EXTREMITIES:  No edema; No deformity   ASSESSMENT AND PLAN ***    {Are you ordering a CV Procedure (e.g. stress test, cath, DCCV, TEE, etc)?   Press F2        :[id number]}  Dispo: ***  Signed, Donnice DELENA Primus, MD

## 2024-03-29 ENCOUNTER — Ambulatory Visit
Payer: Self-pay | Attending: Student in an Organized Health Care Education/Training Program | Admitting: Student in an Organized Health Care Education/Training Program

## 2024-03-29 ENCOUNTER — Encounter (HOSPITAL_COMMUNITY): Payer: Self-pay | Admitting: Oncology

## 2024-03-29 ENCOUNTER — Encounter: Payer: Self-pay | Admitting: Adult Health Nurse Practitioner

## 2024-03-29 ENCOUNTER — Encounter: Payer: Self-pay | Admitting: Student in an Organized Health Care Education/Training Program

## 2024-03-29 VITALS — BP 134/82 | HR 61 | Ht 63.0 in | Wt 183.8 lb

## 2024-03-29 DIAGNOSIS — I4819 Other persistent atrial fibrillation: Secondary | ICD-10-CM

## 2024-03-29 NOTE — Patient Instructions (Signed)

## 2024-04-21 ENCOUNTER — Ambulatory Visit: Payer: Self-pay | Admitting: Student

## 2024-05-05 ENCOUNTER — Other Ambulatory Visit (HOSPITAL_COMMUNITY): Payer: Self-pay | Admitting: Podiatry

## 2024-05-05 DIAGNOSIS — M86671 Other chronic osteomyelitis, right ankle and foot: Secondary | ICD-10-CM

## 2024-05-06 ENCOUNTER — Ambulatory Visit (HOSPITAL_COMMUNITY)
Admission: RE | Admit: 2024-05-06 | Discharge: 2024-05-06 | Disposition: A | Discharge status: Home / Self Care | Source: Ambulatory Visit | Attending: Podiatry | Admitting: Podiatry

## 2024-05-06 DIAGNOSIS — M86671 Other chronic osteomyelitis, right ankle and foot: Secondary | ICD-10-CM | POA: Insufficient documentation

## 2024-05-31 ENCOUNTER — Other Ambulatory Visit (HOSPITAL_COMMUNITY): Payer: Self-pay | Admitting: Adult Health Nurse Practitioner

## 2024-06-01 ENCOUNTER — Ambulatory Visit: Admitting: Student

## 2024-06-04 ENCOUNTER — Encounter: Payer: Self-pay | Admitting: Adult Health Nurse Practitioner

## 2024-06-04 ENCOUNTER — Encounter (HOSPITAL_COMMUNITY): Payer: Self-pay | Admitting: Oncology

## 2024-06-24 ENCOUNTER — Ambulatory Visit
